# Patient Record
Sex: Female | Born: 1960 | Race: Black or African American | Hispanic: No | Marital: Married | Smoking: Former smoker
Health system: Southern US, Community
[De-identification: ages and names within clinical notes are randomized; demographics above are authoritative.]

## PROBLEM LIST (undated history)

## (undated) DIAGNOSIS — I209 Angina pectoris, unspecified: Secondary | ICD-10-CM

## (undated) DIAGNOSIS — G4730 Sleep apnea, unspecified: Secondary | ICD-10-CM

## (undated) DIAGNOSIS — F329 Major depressive disorder, single episode, unspecified: Secondary | ICD-10-CM

## (undated) DIAGNOSIS — Z9221 Personal history of antineoplastic chemotherapy: Secondary | ICD-10-CM

## (undated) DIAGNOSIS — Z86711 Personal history of pulmonary embolism: Secondary | ICD-10-CM

## (undated) DIAGNOSIS — I10 Essential (primary) hypertension: Secondary | ICD-10-CM

## (undated) DIAGNOSIS — F32A Depression, unspecified: Secondary | ICD-10-CM

## (undated) DIAGNOSIS — R0602 Shortness of breath: Secondary | ICD-10-CM

## (undated) DIAGNOSIS — F3131 Bipolar disorder, current episode depressed, mild: Secondary | ICD-10-CM

## (undated) DIAGNOSIS — Z923 Personal history of irradiation: Secondary | ICD-10-CM

## (undated) DIAGNOSIS — IMO0002 Reserved for concepts with insufficient information to code with codable children: Secondary | ICD-10-CM

## (undated) DIAGNOSIS — F319 Bipolar disorder, unspecified: Secondary | ICD-10-CM

## (undated) DIAGNOSIS — R51 Headache: Secondary | ICD-10-CM

## (undated) DIAGNOSIS — C50919 Malignant neoplasm of unspecified site of unspecified female breast: Secondary | ICD-10-CM

## (undated) HISTORY — DX: Sleep apnea, unspecified: G47.30

## (undated) HISTORY — DX: Major depressive disorder, single episode, unspecified: F32.9

## (undated) HISTORY — PX: TUBAL LIGATION: SHX77

## (undated) HISTORY — DX: Essential (primary) hypertension: I10

## (undated) HISTORY — DX: Malignant neoplasm of unspecified site of unspecified female breast: C50.919

## (undated) HISTORY — DX: Depression, unspecified: F32.A

## (undated) HISTORY — PX: REDUCTION MAMMAPLASTY: SUR839

## (undated) HISTORY — PX: ABDOMINAL HYSTERECTOMY: SHX81

## (undated) HISTORY — PX: BREAST LUMPECTOMY: SHX2

## (undated) HISTORY — DX: Bipolar disorder, current episode depressed, mild: F31.31

---

## 2008-08-31 ENCOUNTER — Emergency Department (HOSPITAL_COMMUNITY): Admission: EM | Admit: 2008-08-31 | Discharge: 2008-09-01 | Payer: Self-pay | Admitting: Emergency Medicine

## 2009-01-13 ENCOUNTER — Ambulatory Visit: Payer: Self-pay | Admitting: *Deleted

## 2009-01-13 ENCOUNTER — Inpatient Hospital Stay (HOSPITAL_COMMUNITY): Admission: RE | Admit: 2009-01-13 | Discharge: 2009-01-17 | Payer: Self-pay | Admitting: *Deleted

## 2010-08-02 ENCOUNTER — Emergency Department (HOSPITAL_COMMUNITY): Admission: EM | Admit: 2010-08-02 | Discharge: 2010-08-03 | Payer: Self-pay | Admitting: Emergency Medicine

## 2010-08-22 ENCOUNTER — Ambulatory Visit: Payer: Self-pay | Admitting: Internal Medicine

## 2010-08-22 DIAGNOSIS — E785 Hyperlipidemia, unspecified: Secondary | ICD-10-CM | POA: Insufficient documentation

## 2010-08-22 DIAGNOSIS — R079 Chest pain, unspecified: Secondary | ICD-10-CM | POA: Insufficient documentation

## 2010-08-22 DIAGNOSIS — R0602 Shortness of breath: Secondary | ICD-10-CM | POA: Insufficient documentation

## 2010-08-24 ENCOUNTER — Telehealth: Payer: Self-pay | Admitting: Internal Medicine

## 2010-08-24 ENCOUNTER — Encounter: Payer: Self-pay | Admitting: Internal Medicine

## 2010-08-24 ENCOUNTER — Ambulatory Visit: Admission: RE | Admit: 2010-08-24 | Discharge: 2010-08-24 | Payer: Self-pay | Admitting: Internal Medicine

## 2010-08-25 ENCOUNTER — Encounter: Payer: Self-pay | Admitting: Internal Medicine

## 2010-08-25 LAB — CONVERTED CEMR LAB
Anti Nuclear Antibody(ANA): NEGATIVE
AntiThromb III Func: 106 % (ref 76–126)
Anticardiolipin IgA: 2 (ref <22)
Anticardiolipin IgG: 4 (ref <23)
Anticardiolipin IgM: 0 (ref <11)
C3 Complement: 161 mg/dL (ref 88–201)
Complement C4, Body Fluid: 40 mg/dL (ref 16–47)
Homocysteine: 7.8 micromoles/L (ref 4.0–15.4)
Protein C Activity: 142 % — ABNORMAL HIGH (ref 75–133)

## 2010-08-28 ENCOUNTER — Telehealth: Payer: Self-pay | Admitting: Internal Medicine

## 2010-09-01 ENCOUNTER — Ambulatory Visit (HOSPITAL_COMMUNITY): Admission: RE | Admit: 2010-09-01 | Discharge: 2010-09-01 | Payer: Self-pay | Admitting: Internal Medicine

## 2010-09-01 ENCOUNTER — Encounter: Payer: Self-pay | Admitting: Internal Medicine

## 2010-09-01 ENCOUNTER — Encounter: Admission: RE | Admit: 2010-09-01 | Discharge: 2010-09-01 | Payer: Self-pay | Admitting: Internal Medicine

## 2010-09-01 ENCOUNTER — Telehealth: Payer: Self-pay | Admitting: Internal Medicine

## 2010-09-01 ENCOUNTER — Telehealth (INDEPENDENT_AMBULATORY_CARE_PROVIDER_SITE_OTHER): Payer: Self-pay | Admitting: *Deleted

## 2010-09-02 ENCOUNTER — Emergency Department (HOSPITAL_COMMUNITY): Admission: EM | Admit: 2010-09-02 | Discharge: 2010-09-03 | Payer: Self-pay | Admitting: Emergency Medicine

## 2010-09-06 ENCOUNTER — Ambulatory Visit: Payer: Self-pay | Admitting: Internal Medicine

## 2010-09-19 ENCOUNTER — Ambulatory Visit: Payer: Self-pay | Admitting: Hematology and Oncology

## 2010-09-21 ENCOUNTER — Ambulatory Visit: Payer: Self-pay | Admitting: Oncology

## 2010-09-25 ENCOUNTER — Encounter: Payer: Self-pay | Admitting: Internal Medicine

## 2010-09-25 ENCOUNTER — Ambulatory Visit (HOSPITAL_COMMUNITY): Admission: RE | Admit: 2010-09-25 | Discharge: 2010-09-25 | Payer: Self-pay | Admitting: Internal Medicine

## 2010-09-30 ENCOUNTER — Ambulatory Visit: Payer: Self-pay | Admitting: Internal Medicine

## 2010-10-16 ENCOUNTER — Ambulatory Visit: Payer: Self-pay | Admitting: Internal Medicine

## 2010-11-03 ENCOUNTER — Ambulatory Visit: Payer: Self-pay | Admitting: Oncology

## 2010-11-20 ENCOUNTER — Ambulatory Visit: Payer: Self-pay | Admitting: Genetic Counselor

## 2010-12-19 ENCOUNTER — Ambulatory Visit
Admission: RE | Admit: 2010-12-19 | Discharge: 2010-12-19 | Payer: Self-pay | Source: Home / Self Care | Attending: Internal Medicine | Admitting: Internal Medicine

## 2010-12-19 ENCOUNTER — Encounter: Payer: Self-pay | Admitting: Internal Medicine

## 2011-01-09 NOTE — Assessment & Plan Note (Signed)
Summary: history of pulm emobolism /plurasy in right lung/cb   Visit Type:  Initial Consult Copy to:  self referral Primary Rome Schlauch/Referring Kaely Hollan:  Dr. Dorothyann Peng  CC:  Pulmonary Consult for sob and chest pain with exertion.Marland Kitchen  History of Present Illness: IOV 08/22/2010: 50 year old overweight female who had PE in 2001. s/p breast cancer 2005. Ex 16 pack smoker - quit july 2011. Has had insidious onset of dyspnea on exertion for several months noticed by family members but not percevied by patient. On 08/02/2010 while shopping felt sudden onset of mid-sternal chest pain.Initially assumed as heart burn but quickly progressed to very severe pain. Went to [facility] ER. CT angiogram ruled out PE and showed normal lungs (personally reviwed). Per hx was told she had pleurisy. Discharged on motrin. 10 days late/10 days ago noticed progresive improvement in pain but still symptomatic. But at this time noticed dyspnea on exertion. Saw Bufford Lope PA at [facility]. CXR reportedly normal. Was given antibiotic Biaxon  and another NSAID Ketorolac. However, despite above measure still with pain and dyspnea. So underwent another CT chest "with contrast"; results pending.   Currently main issue is chest pain and dyspnea. Chest pain is right in middle of sternum between both breasts. Present as a dull, constant pain. Deep inspiraiton or walking makes it worse. Pain is somewhat improved by resting. Denies radiation of pain. Dyspnea is present with  exertion and relieved by  rest and  is moderate in intensity. Dyspnea brought on by actiivies like walking more than 5 minutes at normal pace. Thre is assoicated nausea and some diarrhea nos with onset of chest pain. No associated cough, wheezing, hemoptysius, edema, syncope, fever, joint issues  Note: 20# intentional weight loss past 6 months  Preventive Screening-Counseling & Management  Alcohol-Tobacco     Smoking Status: quit     Smoking Cessation  Counseling: no     Smoke Cessation Stage: quit     Packs/Day: 0.5     Year Started: 1979     Year Quit: 2011- july      Pack years: 16  Current Medications (verified): 1)  Lithium Carbonate 300 Mg Caps (Lithium Carbonate) .... Take 1 Tablet By Mouth Two Times A Day 2)  Prozac 10 Mg Caps (Fluoxetine Hcl) .... Take 1 Tablet By Mouth Once A Day 3)  Biaxin 500 Mg Tabs (Clarithromycin) .... Take 1 Tablet By Mouth Two Times A Day 4)  Ketorolac Tromethamine 10 Mg Tabs (Ketorolac Tromethamine) .... Take 1 Tablet By Mouth Four Times A Day 5)  Lipitor 20 Mg Tabs (Atorvastatin Calcium) .... Take 1 Tablet By Mouth Once A Day  Allergies (verified): 1)  ! Codeine 2)  ! * Lamictal 3)  ! * Neurotin  Past History:  Past Medical History: # Breast cancer in 2005   - Treated at [facility]. Dr Doyne Keel  - Under remission. No oncologist in [place].  # Bilateral pulmonary embolisms in 2001    - "both lungs". Diagnosed at [facility]. Treatd in medical floor with heparin and coumadin  - s/p coumadin Rx for 1 year. Per hx "heart was great", oxygen level and heart rate was 'good' # She has had a history of 12 prior suicide attempts #Bipolar disorder #Hyperlipidemia  Past Surgical History: hysterectomy in 2005,  tubal ligation lumpectomy-2005  Family History: PGM -breast cancer  Social History: the patient is abstinent  from alcohol for the past 6-1/2 years.  Before that she had an alcohol  dependence.  (per echart notes) Gambling addiction Patient states former smoker.  Quit 06-09-10, started 1979 avg 1/2ppd.  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  The patient denies shortness of breath at rest, productive  cough, non-productive cough, coughing up blood, irregular heartbeats, acid heartburn, indigestion, loss of appetite, weight change, abdominal pain, difficulty swallowing, sore throat, tooth/dental problems, headaches, nasal congestion/difficulty breathing through nose, sneezing, itching, ear ache, anxiety, depression, hand/feet swelling, joint stiffness or pain, rash, change in color of mucus, and fever.    Vital Signs:  Patient profile:   50 year old female Height:      65 inches Weight:      237.50 pounds BMI:     39.66 O2 Sat:      97 % on Room air Temp:     98.3 degrees F oral Pulse rate:   68 / minute BP sitting:   120 / 80  (left arm) Cuff size:   regular  Vitals Entered By: Carron Curie CMA (August 22, 2010 4:29 PM)  O2 Flow:  Room air  Serial Vital Signs/Assessments:  Comments: Ambulatory Pulse Oximetry  Resting; HR__85___    02 95  Lap1 (185 feet)   HR__88___   02 Sat_97____ Lap2 (185 feet)   HR___82__   02 Sat__98___    Lap3 (185 feet)   HR___101__   02 Sat__98___  _x_Test Completed without Difficulty ___Test Stopped due to:   By: Carron Curie CMA   CC: Pulmonary Consult for sob and chest pain with exertion. Comments Medications reviewed with patient Carron Curie CMA  August 22, 2010 4:39 PM Daytime phone number verified with patient.    Physical Exam  General:  well developed, well nourished, in no acute distressobese.   Head:  normocephalic and atraumatic Eyes:  PERRLA/EOM intact; conjunctiva and sclera clear Ears:  TMs intact and clear with normal canals Nose:  no deformity, discharge, inflammation, or lesions Mouth:  no deformity or lesions Neck:  no masses, thyromegaly, or abnormal cervical nodes Chest Wall:  no deformities noted Lungs:  clear bilaterally to auscultation and percussion Heart:  regular rate and rhythm, S1, S2 without murmurs, rubs, gallops, or clicks Abdomen:  bowel sounds positive; abdomen soft and non-tender  without masses, or organomegaly Msk:  no deformity or scoliosis noted with normal posture Pulses:  pulses normal Extremities:  no clubbing, cyanosis, edema, or deformity noted Neurologic:  CN II-XII grossly intact with normal reflexes, coordination, muscle strength and tone Skin:  intact without lesions or rashes Cervical Nodes:  no significant adenopathy Axillary Nodes:  no significant adenopathy Psych:  alert and cooperative; normal mood and affect; normal attention span and concentration   CT of Chest  Procedure date:  08/02/2010  Findings:      normal parencyhma 1 small precarinal node no copd normal ct chest  Comments:      outside ct chest 9/92011 is very similar to above.   MISC. Report  Procedure date:  08/02/2010  Findings:      Sodium (NA)  141               135-145          mEq/L  Potassium (K)                            3.4        l      3.5-5.1          mEq/L  Chloride                                 108               96-112           mEq/L  CO2                                      23                19-32            mEq/L  Glucose                                  135        h      70-99            mg/dL  BUN                                      9                 6-23             mg/dL  Creatinine                               0.93              0.4-1.2          mg/dL  GFR, Est Non African American            >60               >60              mL/min  GFR, Est African American                >60               >60              mL/min    Oversized comment, see footnote  1  Bilirubin, Total                         0.5               0.3-1.2          mg/dL  Alkaline Phosphatase                     97                39-117  U/L  SGOT (AST)                               29                0-37             U/L  SGPT (ALT)                               30                0-35             U/L  Total  Protein                           7.9                6.0-8.3          g/dL  Albumin-Blood                            3.9               3.5-5.2          g/dL  Calcium                                  8.9               8.4-10.5         mg/dL  MISC. Report  Procedure date:  08/02/2010  Findings:      trop x 1  - nromal  Impression & Recommendations:  Problem # 1:  DYSPNEA (ICD-786.05) Assessment New Insidoous onset. CHronic progressive wit subacute worsening associated with chest pain. Ex-smoker. Prior PE. Normal CT chest 08/02/2010 and 08/18/2010. Unclear cuase of symptoms  PLAN Get PFTs If normal, consider ECHO or Rt heart cath or CPST or even autimunne workup for lupus pleurisy Finish up biaxin take NSAID prn Orders: Pulmonary Referral (Pulmonary) New Patient Level V ([address])  Problem # 2:  CHEST PAIN (ICD-786.50) Assessment: New as in dyspnea Orders: Pulmonary Referral (Pulmonary) New Patient Level V (60454)  Medications Added to Medication List This Visit: 1)  Lithium Carbonate 300 Mg Caps (Lithium carbonate) .... Take 1 tablet by mouth two times a day 2)  Prozac 10 Mg Caps (Fluoxetine hcl) .... Take 1 tablet by mouth once a day 3)  Biaxin 500 Mg Tabs (Clarithromycin) .... Take 1 tablet by mouth two times a day 4)  Ketorolac Tromethamine 10 Mg Tabs (Ketorolac tromethamine) .... Take 1 tablet by mouth four times a day 5)  Lipitor 20 Mg Tabs (Atorvastatin calcium) .... Take 1 tablet by mouth once a day  Patient Instructions: 1)  2nd ct chest 08/18/2010 looks normal to me 2)  have full breathing test asap at [place] or [place] 3)  once you finish it call me for review 4)  based on results next step   Immunization History:  Influenza Immunization History:    Influenza:  never (08/10/2010)  Pneumovax Immunization History:    Pneumovax:  never (08/10/2010)   Appended Document: Orders Update    Clinical Lists Changes  Orders: Added new Test order of TLB-Rheumatoid Factor (  RA) (04540-JW) - Signed Added  new Test order of TLB-Sedimentation Rate (ESR) (85652-ESR) - Signed      Appended Document: Orders Update    Clinical Lists Changes  Orders: Added new Test order of T- * Misc. Laboratory test [phone]) - Signed

## 2011-01-09 NOTE — Progress Notes (Signed)
Summary: methacholine challenge test- call pt asap  Phone Note Call from Patient Call back at Home Phone [phone]   Caller: Patient Call For: ramaswamy Summary of Call: pt was to have a methacholine challenge test today. she called [facility] and was told they didn't have her scheduled for anything. pt would like a call back asap.  Initial call taken by: Tivis Ringer, CNA,  September 01, 2010 9:55 AM  Follow-up for Phone Call        pt was given # to call cardio pul to reschedule this appt it was a mix up in what both people said  Follow-up by: Oneita Jolly,  September 01, 2010 10:37 AM

## 2011-01-09 NOTE — Letter (Signed)
Summary: CPST- R/O Contraindications  [facility]  [address]   [place], [place] [address]   Phone: [phone]  Fax: [phone]    Patient's Name: SHAMARI TROSTEL Date of Birth: 1961-11-06 MRN: [id number]  *********Rule out Contraindications**************** Absolute                                                                                                                           ___ Acute MI (3-5 Days)                                 ___ Unstable Angina                                          ___ Uncontrolled arrhythmias causing symptoms or hemodynamic compromise. ___ Syncope                                                     ___ Active endocarditis                                         ___ Acute Myocarditis/Pericarditis                        ___ Symptomatic severe aortic stenosis  ___ Acute Pulmonary embolus or pulmonary infarction                ___ Uncontrolled Heart Failure  ___ Thrombosis of lower extremitie ___ Suspected dissecting aneurysm ___ Uncontrolled Asthma                          ___ Pulmonary Edema                                        ___ RA desat @ rest<85%                                      ___ Repiratory Failure                                         ___ Acute noncardiopulmonary disorder that may affect exercise performance or be         aggravated by exercise (ie infection, renal failure,  thyrotoxicosis) .                               ___ Mental impairment leading to inabliity to cooperate   Relative ___ Left main coronary stenosis or its equivalent ___ Moderate stentoic valvular heart disease ___ Severe untreated arterial hypertension @ rest (<200 mmHg             systolic,>164mmHg Diastolic ___ Tachy/Brady Arrhythmias ___ High- degree artioventricular block ___ Hypertrophic cardiomyopathy ___ Significant pulmonary hypertension ___ Advanced or complicated pregnancy ___ Electrolyte abnormalities ___ Orthopedic impairment  that compromises exercise performance  NO CONTRAINDICATIONS  Kalman Shan MD    [facility]

## 2011-01-09 NOTE — Letter (Signed)
Summary: CPST [facility]  [address]   [place], [place] [address]   Phone: [phone]  Fax: [phone]     Patient's Name: NYJAH SCHWAKE Date of Birth: Apr 21, 1961 MRN: [id number]  CPST  Choose test method and choice  a)_x__Bike - recommended by ATS/ACCP. Do at [facility] at Dr. Gala Romney [facility]  b)___Treadmill - less preferred. Do at [facility] at Dr. Gala Romney lab or do at [facility] PFT lab  Choose one or more indication for test  INDICATIONS FOR CARDIOPULMONARY EXERCISE TESTING Evaluation of exercise tolerance __x____ Determination of functional impairment or capacity (peak V? O2) __x____ Determination of exercise-limiting factors and pathophysiologic mechanisms  Evaluation of undiagnosed exercise intolerance ____x_ Assessing contribution of cardiac and pulmonary etiology in coexisting disease __x___ Symptoms disproportionate to resting pulmonary and cardiac tests  __x___Unexplained dyspnea when initial cardiopulmonary testing is nondiagnostic  Evaluation of patients with cardiovascular disease _____ Functional evaluation and prognosis in patients with heart failure _____ Selection for cardiac transplantation _____ Exercise prescription and monitoring response to exercise training for cardiac rehabilitation (special circumstances; i.e., pacemakers)  Evaluation of patients with respiratory disease _____ Functional impairment assessment (see specific clinical applications)  _____Chronic obstructive pulmonary disease Establishing exercise limitation(s) and assessing other potential contributing factors, especially occult heart disease (ischemia) ______Determination of magnitude of hypoxemia and for O2 prescription When objective determination of therapeutic intervention is necessary and not adequately addressed by standard pulmonary function testing  _____ Interstitial lung diseases _____Detection of early (occult) gas exchange abnormalities _____Overall  assessment/monitoring of pulmonary gas exchange _____Determination of magnitude of hypoxemia and for O2 prescription _____Determination of potential exercise-limiting factors _____Documentation of therapeutic response to potentially toxic therapy  ____ Pulmonary vascular disease (careful risk-benefit analysis required)  ____ Cystic fibrosis  ___x_ Exercise-induced bronchospasm  Specific clinical applications ____  Preoperative evaluation _____Lung resectional surgery _____Elderly patients undergoing major abdominal surgery _____Lung volume resectional surgery for emphysema (currently investigational)  ____ Exercise evaluation and prescription for pulmonary rehabilitation  ____ Evaluation for impairment-disability  ____ Evaluation for lung, heart-lung transplantation ____ Definition of abbreviation: V? O2______ -oxygen consumption.    Kalman Shan MD    [facility]

## 2011-01-09 NOTE — Assessment & Plan Note (Signed)
Summary: f/u pft ///kp   Visit Type:  Follow-up Copy to:  self referral Primary Provider/Referring Provider:  Dr. Dorothyann Peng  CC:  Pt here to discuss MCT and pft results..  History of Present Illness: IOV 08/22/2010: 50 year old overweight female who had PE in 2001. s/p breast cancer 2005. Ex 16 pack smoker - quit july 2011. Has had insidious onset of dyspnea on exertion for several months noticed by family members but not percevied by patient. On 08/02/2010 while shopping felt sudden onset of mid-sternal chest pain.Initially assumed as heart burn but quickly progressed to very severe pain. Went to [facility] ER. CT angiogram ruled out PE and showed normal lungs (personally reviwed). Per hx was told she had pleurisy. Discharged on motrin. 10 days late/10 days ago noticed progresive improvement in pain but still symptomatic. But at this time noticed dyspnea on exertion. Saw Bufford Lope PA at [facility]. CXRnormal. Was given antibiotic and NSAID Ketorolac. Dspite above measure still with pain and dyspnea. So underwent another CT chest "with contrast"; results pending.   Currently main issue is chest pain and dyspnea. Chest pain is right in middle of sternum between both breasts. Present as a dull, constant pain. Deep inspiraiton or walking makes it worse. Pain is somewhat improved by resting. Denies radiation of pain. Dyspnea is present with  exertion and relieved by  rest and  is moderate in intensity. Dyspnea brought on by actiivies like walking more than 5 minutes at normal pace. Thre is assoicated nausea and some diarrhea nos with onset of chest pain. No associated cough, wheezing, hemoptysius, edema, syncope, fever, joint issues. Note: 20# intentional weight loss past 6 months. REC: :LAB TEST   OV 09/06/2010: Review test results. In interim, symptoms continue. Autoimmune and hypercoag panel - normal. PFts 08/24/2010 were normal. So, she  underwent methacholine challenge 09/06/2010. She  responded to saline challenge by dropping Fev1 27% to 1.88L. Eval of loop shows possible VCD on insp loop. There are no other complaints. She continues to deny drug abuse. She admits to lot of social stress and also occupation where she has to talk a lot     Preventive Screening-Counseling & Management  Alcohol-Tobacco     Smoking Status: quit     Smoking Cessation Counseling: no     Smoke Cessation Stage: quit     Packs/Day: 0.5     Year Started: 1979     Year Quit: 2011- july      Pack years: 16  Current Medications (verified): 1)  Lithium Carbonate 300 Mg Caps (Lithium Carbonate) .... Take 1 Tablet By Mouth Two Times A Day 2)  Prozac 10 Mg Caps (Fluoxetine Hcl) .... Take 1 Tablet By Mouth Once A Day 3)  Lipitor 20 Mg Tabs (Atorvastatin Calcium) .... Take 1 Tablet By Mouth Once A Day  Allergies (verified): 1)  ! Codeine 2)  ! * Lamictal 3)  ! * Neurotin  Past History:  Past medical, surgical, family and social histories (including risk factors) reviewed, and no changes noted (except as noted below).  Past Medical History: Reviewed history from 08/22/2010 and no changes required. # Breast cancer in 2005   - Treated at [facility]. Dr Doyne Keel  - Under remission. No oncologist in [place].  # Bilateral pulmonary embolisms in 2001    - "both lungs". Diagnosed at [facility]. Treatd in medical floor with heparin and coumadin  - s/p coumadin Rx for 1 year. Per hx "heart was great", oxygen  level and heart rate was 'good' # She has had a history of 12 prior suicide attempts #Bipolar disorder #Hyperlipidemia  Past Surgical History: Reviewed history from 08/22/2010 and no changes required. hysterectomy in 2005,  tubal ligation lumpectomy-2005  Family History: Reviewed history from 08/22/2010 and no changes required. PGM -breast cancer  Social History: Reviewed history from 08/22/2010 and no changes required. the patient is abstinent  from alcohol for the past 6-1/2  years.  Before that she had an alcohol  dependence.  (per echart notes) Gambling addiction Patient states former smoker.  Quit 06-09-10, started 1979 avg 1/2ppd.  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 Comments Medications reviewed with patient Carron Curie CMA  September 06, 2010 9:45 AM Daytime phone number verified with patient.    Physical Exam  General:  well developed, well nourished, in no acute distressobese.   Head:  normocephalic and atraumatic Eyes:  PERRLA/EOM intact; conjunctiva and sclera clear Ears:  TMs intact and clear with normal canals Nose:  no deformity, discharge, inflammation, or lesions Mouth:  no deformity or lesions Neck:  no masses, thyromegaly, or abnormal cervical nodes Chest Wall:  no deformities noted Lungs:  clear bilaterally to auscultation and percussion Heart:  regular rate and rhythm, S1, S2 without murmurs, rubs, gallops, or clicks Abdomen:  bowel sounds positive; abdomen soft and non-tender without masses, or organomegaly Msk:  no deformity or scoliosis noted with normal posture Pulses:  pulses normal Extremities:  no clubbing, cyanosis, edema, or deformity noted Neurologic:  CN II-XII grossly intact with normal reflexes, coordination, muscle strength and tone Skin:  intact without lesions or rashes Cervical Nodes:  no significant adenopathy Axillary Nodes:  no significant  adenopathy Psych:  alert and cooperative; normal mood and affect; normal attention span and concentration   Impression & Recommendations:  Problem # 1:  DYSPNEA (ICD-786.05) Assessment Unchanged  Orders: Est. Patient Level II (66440)  Insidoous onset. CHronic progressive wit subacute worsening associated with chest pain. Ex-smoker. Prior PE. Normal CT chest 08/02/2010 and 08/18/2010. Normal PFT sept 2011. Normal autimimmune and hypercoag profile.  Methacholine challenge test unsuually hyperresponsive to saline with possible evidence of VCD in saline challenge insp loop. Still,. Unclear cuase of symptoms  PLAN  CPST test  Problem # 2:  CHEST PAIN (ICD-786.50)  Orders: Est. Patient Level II (34742)  Patient Instructions: 1)  please have CPST bike test 2)  followup after bike test 3)  next visit we will give you flu shot    Appended Document: f/u pft ///kp pls place order for CPSt. I just forgot to do that but forms filled  Appended Document: Orders Update    Clinical Lists Changes  Orders: Added  new Referral order of Cardio-Pulmonary Stress Test Referral (Cardio-Pulmon) - Signed

## 2011-01-09 NOTE — Progress Notes (Signed)
Summary: SOB/ recs?  Phone Note Call from Patient   Caller: Patient Call For: [facility] Summary of Call: pt had methacholine challenge test today. says she failed. she c/o SOB/ difficulty breathing even while washing dishes. wants to know what recs are since she doesn't see MR until next wed. call cell (since she is "out and about today") [phone] Initial call taken by: Tivis Ringer, CNA,  September 01, 2010 3:56 PM  Follow-up for Phone Call        tightness in chest, increased SOB with activity x1-2weeks - denies wheezing, cough, f/c/s.  please advise, thanks!  MR not in office today.  will send to doc of the day. Boone Master CNA/MA  September 01, 2010 4:32 PM      Additional Follow-up for Phone Call Additional follow up Details #1::        d/w Dr. Marchelle Gearing.  He will call to discuss with patient.  Will forward phone note to Dr. Marchelle Gearing. Additional Follow-up by: Coralyn Helling MD,  September 01, 2010 5:01 PM    Additional Follow-up for Phone Call Additional follow up Details #2::    spoke to patient: Symptoms are chronic and no worse after methacholine challlenge. TEst reportedly terminated at saline because of abnromal response. Advised empiric rantidine and omeprazole with tums. IF worse anytime, go to ER. Will see her next week. Need the methahole results at time of fu Follow-up by: Kalman Shan MD,  September 01, 2010 5:18 PM

## 2011-01-09 NOTE — Progress Notes (Signed)
Summary: needs methacholine chalenge-order placed  Phone Note Outgoing Call   Summary of Call: hypercoagulable profile and autioimmune panel are negative. STill with symptoms. Next step do methacholine challenge test. I have ordered this She will call once test is done to review reslts. If negaive, will do cpst Initial call taken by: Kalman Shan MD,  August 28, 2010 1:37 PM

## 2011-01-09 NOTE — Progress Notes (Signed)
Summary: PFT normal-labs ordered  Phone Note Call from Patient   Caller: Patient Call For: ramaswamy Summary of Call: pt had pft's done this am at [facility]. wants results. she also has scheduled a f/u w/ mr for 9/28 but wants results asap. cell [phone] Initial call taken by: Tivis Ringer, CNA,  August 24, 2010 12:45 PM  Follow-up for Phone Call        PFTs requested from [facility].  Will place in MR's to do bucket once received.  Crystal Jones RN  August 24, 2010 1:53 PM Pt returned call. i informed her of the "above" and pt said that's fine. she will await call re: results from MR or nurse after he recieves and has reviewed results. Tivis Ringer, CNA  August 24, 2010 2:21 PM  PFT results received and placed in MRs to do bucket.  Will forward message to him so he is aware pt requesting results asap.     Follow-up by: Gweneth Dimitri RN,  August 24, 2010 2:49 PM  Additional Follow-up for Phone Call Additional follow up Details #1::        PFts9/15/2011 are NORMAL. Gave results. Will order autoimmune workup for lupus and hypercoag panel. She will come tomorrow morning Additional Follow-up by: Kalman Shan MD,  August 24, 2010 5:33 PM    Additional Follow-up for Phone Call Additional follow up Details #2::    placed lab order in IDx and pt came in at 10:55 to have labs drawn. advised will call her with results. Carron Curie CMA  August 25, 2010 10:58 AM

## 2011-01-09 NOTE — Assessment & Plan Note (Signed)
Summary: F/U SOB/CPST ON 09/25/10/[facility]   Visit Type:  Follow-up Copy to:  self referral Primary Provider/Referring Provider:  Dr. Dorothyann Peng  CC:  Follow-up to discuss CPST. Marland Kitchen  History of Present Illness: IOV 08/22/2010: 50 year old overweight female who had PE in 2001. s/p breast cancer 2005. Ex 16 pack smoker - quit july 2011. Has had insidious onset of dyspnea on exertion for several months noticed by family members but not percevied by patient. On 08/02/2010 while shopping felt sudden onset of mid-sternal chest pain.Initially assumed as heart burn but quickly progressed to very severe pain. Went to [facility] ER. CT angiogram ruled out PE and showed normal lungs (personally reviwed). Per hx was told she had pleurisy. Discharged on motrin. 10 days late/10 days ago noticed progresive improvement in pain but still symptomatic. But at this time noticed dyspnea on exertion. Saw Bufford Lope PA at [facility]. CXRnormal. Was given antibiotic and NSAID Ketorolac. Dspite above measure still with pain and dyspnea. So underwent another CT chest "with contrast"; results pending.   Currently main issue is chest pain and dyspnea. Chest pain is right in middle of sternum between both breasts. Present as a dull, constant pain. Deep inspiraiton or walking makes it worse. Pain is somewhat improved by resting. Denies radiation of pain. Dyspnea is present with  exertion and relieved by  rest and  is moderate in intensity. Dyspnea brought on by actiivies like walking more than 5 minutes at normal pace. Thre is assoicated nausea and some diarrhea nos with onset of chest pain. No associated cough, wheezing, hemoptysius, edema, syncope, fever, joint issues. Note: 20# intentional weight loss past 6 months. REC: :LAB TEST   OV 09/06/2010: Review test results. In interim, symptoms continue. Autoimmune and hypercoag panel - normal. PFts 08/24/2010 were normal. So, she  underwent methacholine challenge 09/06/2010. She  responded to saline challenge by dropping Fev1 27% to 1.88L. Eval of loop shows possible VCD on insp loop. There are no other complaints. She continues to deny drug abuse. She admits to lot of social stress and also occupation where she has to talk a lot. REC: CPS      October 16, 2010: FU after CPST that was done on 09/25/2010. CPST shows obesity and EIB. From resp standpoiont no new symptoms since last visit. Having significant cycling of bipolar per history. Seeing a pscyh. No active suicidal ideation. Has not had flu shot. C/p of left biceps area tenderness without swelling or fever.   Preventive Screening-Counseling & Management  Alcohol-Tobacco     Smoking Status: quit     Smoking Cessation Counseling: no     Smoke Cessation Stage: quit     Packs/Day: 0.5     Year Started: 1979     Year Quit: 2011- july      Pack years: 16  Current Medications (verified): 1)  Lithium Carbonate 300 Mg Caps (Lithium Carbonate) .... Take2 Tablet By Mouth Two Times A Day 2)  Prozac 10 Mg Caps (Fluoxetine Hcl) .... Take 1 Tablet By Mouth Once A Day 3)  Lipitor 20 Mg Tabs (Atorvastatin Calcium) .... Take 1 Tablet By Mouth Once A Day 4)  Risperdal 1 Mg Tabs (Risperidone) .... Take 1 Tablet By Mouth Once A Day  Allergies (verified): 1)  ! Codeine 2)  ! * Lamictal 3)  ! * Neurotin  Past History:  Past medical, surgical, family and social histories (including risk factors) reviewed, and no changes noted (except as noted below).  Past Medical History: Reviewed history from 08/22/2010 and no changes required. # Breast cancer in 2005   - Treated at [facility]. Dr Doyne Keel  - Under remission. No oncologist in [place].  # Bilateral pulmonary embolisms in 2001    - "both lungs". Diagnosed at [facility]. Treatd in medical floor with heparin and coumadin  - s/p coumadin Rx for 1 year. Per hx "heart was great", oxygen level and heart rate was 'good' # She has had a history of 12 prior suicide  attempts #Bipolar disorder #Hyperlipidemia  Past Surgical History: Reviewed history from 08/22/2010 and no changes required. hysterectomy in 2005,  tubal ligation lumpectomy-2005  Family History: Reviewed history from 08/22/2010 and no changes required. PGM -breast cancer  Social History: Reviewed history from 08/22/2010 and no changes required. the patient is abstinent  from alcohol for the past 6-1/2 years.  Before that she had an alcohol  dependence.  (per echart notes) Gambling addiction Patient states former smoker.  Quit 06-09-10, started 1979 avg 1/2ppd.  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  The patient denies shortness of breath at rest, productive cough, non-productive cough, coughing up blood, chest pain, irregular heartbeats, acid heartburn, indigestion, loss of appetite, weight change, abdominal pain, difficulty swallowing, sore throat, tooth/dental problems, headaches, nasal congestion/difficulty breathing through nose, sneezing, itching, ear ache, anxiety, depression, hand/feet swelling, rash, change in color of mucus, and fever.         pain in left arm  Vital Signs:  Patient profile:   50 year old female Height:      65 inches Weight:      241.50 pounds BMI:     40.33 O2 Sat:      98 % on Room air Temp:     98.3 degrees F oral Pulse rate:   73 / minute BP sitting:   132 / 78  (right arm) Cuff size:   large  Vitals Entered By: Carron Curie CMA (October 16, 2010 4:26 PM)  O2 Flow:  Room air CC: Follow-up to discuss CPST.  Comments Medications reviewed with patient Carron Curie CMA  October 16, 2010 4:28 PM Daytime phone number verified with patient.    Physical Exam  General:  well developed, well  nourished, in no acute distressobese.   Head:  normocephalic and atraumatic Eyes:  PERRLA/EOM intact; conjunctiva and sclera clear Ears:  TMs intact and clear with normal canals Nose:  no deformity, discharge, inflammation, or lesions Mouth:  no deformity or lesions Neck:  no masses, thyromegaly, or abnormal cervical nodes Chest Wall:  no deformities noted Lungs:  clear bilaterally to auscultation and percussion Heart:  regular rate and rhythm, S1, S2 without murmurs, rubs, gallops, or clicks Abdomen:  bowel sounds positive; abdomen soft and non-tender without masses, or organomegaly Msk:  no deformity or scoliosis noted with normal posture Pulses:  pulses normal Extremities:  left arm mid 1/3  - indurated area of 3cm but no redness or crepitus but moderately tender Neurologic:  CN II-XII grossly intact with normal reflexes, coordination, muscle strength and tone Skin:  intact without lesions or rashes Cervical Nodes:  no significant adenopathy Axillary Nodes:  no significant adenopathy Psych:  alert and  cooperative; normal mood and affect; normal attention span and concentration   Impression & Recommendations:  Problem # 1:  DYSPNEA (ICD-786.05) Assessment Unchanged  Orders: Est. Patient Level II (24401)  Insidoous onset. CHronic progressive wit subacute worsening associated with chest pain. Ex-smoker. Prior PE. Normal CT chest 08/02/2010 and 08/18/2010. Normal PFT sept 2011. Normal autimimmune and hypercoag profile.  Methacholine challenge test unsuually hyperresponsive to saline with possible evidence of VCD in saline challenge insp loop. CPST 09/26/2010 shows obesity and positive EIB though this could have been VCD  PLAN  - weight loss discussed - she is finding this challengeing esp in seting of bipolar. WE discussed joining weight watchers. AT some point in future she wants referral to [facility] for bariatric surgery evaluation  - start empiric qvar for EIB  =-rov 2 months - flu shot  today  - advised to go to PMD about tender area in left arm   Orders: Est. Patient Level II (02725)  Medications Added to Medication List This Visit: 1)  Lithium Carbonate 300 Mg Caps (Lithium carbonate) .... Take2 tablet by mouth two times a day 2)  Risperdal 1 Mg Tabs (Risperidone) .... Take 1 tablet by mouth once a day 3)  Qvar 40 Mcg/act Aers (Beclomethasone dipropionate) .... Two puffs twice daily 4)  Proair Hfa 108 (90 Base) Mcg/act Aers (Albuterol sulfate) .Marland Kitchen.. 1-2 puffs every 4-6 hours as needed  Patient Instructions: 1)  pleae have flu shot 2)  start QVAR 2 puff two times a day and albuterol as needed 3)   - take 1 sample of each 4)   - learn technique 5)  return in 2 months 6)  spirometry at followup 7)  focus on weight loss 8)  see your primary care doctor for your painful left arm Prescriptions: PROAIR HFA 108 (90 BASE) MCG/ACT  AERS (ALBUTEROL SULFATE) 1-2 puffs every 4-6 hours as needed  #1 x 6   Entered and Authorized by:   Kalman Shan MD   Signed by:   Kalman Shan MD on 10/16/2010   Method used:   Electronically to        [facility] [phone]* (retail)       [address]       [place], [place]  [address]       Ph: [phone]       Fax: [phone]   RxID:   [id number] QVAR 40 MCG/ACT  AERS (BECLOMETHASONE DIPROPIONATE) Two puffs twice daily  #1 x 2   Entered and Authorized by:   Kalman Shan MD   Signed by:   Kalman Shan MD on 10/16/2010   Method used:   Electronically to        [facility] [phone]* (retail)       [address]       [place], [place]  [address]       Ph: [phone]       Fax: [phone]   RxID:   [id number]

## 2011-01-09 NOTE — Progress Notes (Signed)
Summary: Pt's Medical Hx/Patient  Pt's Medical Hx/Patient   Imported By: Sherian Rein 09/06/2010 11:31:26  _____________________________________________________________________  External Attachment:    Type:   Image     Comment:   External Document

## 2011-01-11 NOTE — Assessment & Plan Note (Signed)
Summary: rov 2 months///kp   Visit Type:  Follow-up Copy to:  self referral Primary Provider/Referring Provider:  Dr. Dorothyann Peng  CC:  follow-up. pt states breathing doing well on qvar.Marland Kitchen  History of Present Illness: Followup dyspnea due to obesity, asthma (proven on mc challenge and EIB on CPST fall 2011) +/- VCD  December 19, 2010: Last visit was 09/06/2010. Started on QVAR at that time for new dx of asthma. Now reporting for fu. States qvar helping a lot. Less dyspneic. Rescue albueterol use is only 1 per 2 weeks. Less chest tightness. Improved dyspnea. Denies associated cough, wheeze. Still with some dysnea when she climbs stairs. No new complaints. Reports qvar compliance. STrluggling to lose weight   Preventive Screening-Counseling & Management  Alcohol-Tobacco     Smoking Status: quit     Smoking Cessation Counseling: no     Smoke Cessation Stage: quit     Packs/Day: 0.5     Year Started: 1979     Year Quit: 2011- july      Pack years: 16  Current Medications (verified): 1)  Lithium Carbonate 300 Mg Caps (Lithium Carbonate) .... Take2 Tablet By Mouth Two Times A Day 2)  Prozac 10 Mg Caps (Fluoxetine Hcl) .... Take 1 Tablet By Mouth Once Every Other Day 3)  Lipitor 20 Mg Tabs (Atorvastatin Calcium) .... Take 1 Tablet By Mouth Once A Day 4)  Risperdal 1 Mg Tabs (Risperidone) .... Take 1 Tablet By Mouth Once A Day 5)  Qvar 40 Mcg/act Aers (Beclomethasone Dipropionate) .... 2 Puffs Twice Daily 6)  Proair Hfa 108 (90 Base) Mcg/act Aers (Albuterol Sulfate) .... 2 Puffs Every 6 Hours As Needed  Allergies: 1)  ! Codeine 2)  ! * Lamictal 3)  ! * Neurotin 4)  ! * Topamax  Past History:  Past medical, surgical, family and social histories (including risk factors) reviewed, and no changes noted (except as noted below).  Past Medical History: Reviewed history from 08/22/2010 and no changes required. # Breast cancer in 2005   - Treated at [facility]. Dr Doyne Keel  - Under  remission. No oncologist in [place].  # Bilateral pulmonary embolisms in 2001    - "both lungs". Diagnosed at [facility]. Treatd in medical floor with heparin and coumadin  - s/p coumadin Rx for 1 year. Per hx "heart was great", oxygen level and heart rate was 'good' # She has had a history of 12 prior suicide attempts #Bipolar disorder #Hyperlipidemia  Past Surgical History: Reviewed history from 08/22/2010 and no changes required. hysterectomy in 2005,  tubal ligation lumpectomy-2005  Family History: Reviewed history from 08/22/2010 and no changes required. PGM -breast cancer  Social History: Reviewed history from 08/22/2010 and no changes required. the patient is abstinent  from alcohol for the past 6-1/2 years.  Before that she had an alcohol  dependence.  (per echart notes) Gambling addiction Patient states former smoker.  Quit 06-09-10, started 1979 avg 1/2ppd.  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  Moved from [place], [place] to [place], [place] Dad lives in [place]  Review of Systems  The patient denies shortness of breath with activity, shortness of breath at rest, productive cough, non-productive cough, coughing up blood, chest pain, irregular heartbeats, acid heartburn, indigestion, loss of appetite, weight change, abdominal pain, difficulty swallowing, sore throat, tooth/dental problems, headaches, nasal congestion/difficulty breathing through nose, sneezing, itching, ear ache, anxiety, depression, hand/feet swelling, joint stiffness  or pain, rash, change in color of mucus, and fever.    Vital Signs:  Patient profile:   50 year old female Height:      65 inches Weight:      244.50 pounds BMI:     40.83 O2 Sat:      99 % on Room air Temp:     98.1 degrees F oral Pulse rate:   73 / minute BP sitting:   112 / 72  (right arm) Cuff size:   regular  Vitals Entered By: Carron Curie CMA (December 19, 2010 2:41 PM)  O2 Flow:  Room air CC: follow-up. pt states breathing doing well on qvar. Comments Medications reviewed with patient Carron Curie CMA  December 19, 2010 2:45 PM Daytime phone number verified with patient.    Physical Exam  General:  well developed, well nourished, in no acute distressobese.   Head:  normocephalic and atraumatic Eyes:  PERRLA/EOM intact; conjunctiva and sclera clear Ears:  TMs intact and clear with normal canals Nose:  no deformity, discharge, inflammation, or lesions Mouth:  no deformity or lesions Neck:  no masses, thyromegaly, or abnormal cervical nodes Chest Wall:  no deformities noted Lungs:  clear bilaterally to auscultation and percussion Heart:  regular rate and rhythm, S1, S2 without murmurs, rubs, gallops, or clicks Abdomen:  bowel sounds positive; abdomen soft and non-tender without masses, or organomegaly Msk:  no deformity or scoliosis noted with normal posture Pulses:  pulses normal Extremities:  left arm mid 1/3  - indurated area of 3cm but no redness or crepitus but moderately tender Neurologic:  CN II-XII grossly intact with normal reflexes, coordination, muscle strength and tone Skin:  intact without lesions or rashes Cervical Nodes:  no significant adenopathy Axillary Nodes:  no significant adenopathy Psych:  alert and cooperative; normal mood and affect; normal attention span and concentration   MISC. Report  Procedure date:  12/19/2010  Findings:      spirometry toay - normal. similar to before  Impression & Recommendations:  Problem # 1:  DYSPNEA (ICD-786.05) Assessment Improved  Improved with asthma Rx QVAR subjectively. Objectively no change but this might never happen  because of asthma dx based onmethacholine challenge and EIB. Obesity playing a significant role too   PLAN  -discussed weight loss. Encouraged to joine a Cytogeneticist or zone diet - contnue qvar (sample given and MDI tech  taught) - rov 6 months  Orders: Est. Patient Level III ([address]) HFA Instruction [phone])  Medications Added to Medication List This Visit: 1)  Prozac 10 Mg Caps (Fluoxetine hcl) .... Take 1 tablet by mouth once every other day 2)  Qvar 40 Mcg/act Aers (Beclomethasone dipropionate) .... 2 puffs twice daily 3)  Proair Hfa 108 (90 Base) Mcg/act Aers (Albuterol sulfate) .... 2 puffs every 6 hours as needed  Patient Instructions: 1)  continue qvar 2)  take samples 3)  show technique on inhalres 4)  join weight watchers or go to [email or web address] 5)  return in 6 months Prescriptions: PROAIR HFA 108 (90 BASE) MCG/ACT AERS (ALBUTEROL SULFATE) 2 puffs every 6 hours as needed  #1 x 2   Entered and Authorized by:   Kalman Shan MD   Signed by:   Kalman Shan MD on 12/19/2010   Method used:   Electronically to        [facility] [address] [phone]* (retail)       [address]  [place], [place]  [address]       Ph: [phone] or [phone]       Fax: [phone]   RxID:   [id number] QVAR 40 MCG/ACT AERS (BECLOMETHASONE DIPROPIONATE) 2 puffs twice daily  #1 x 6   Entered and Authorized by:   Kalman Shan MD   Signed by:   Kalman Shan MD on 12/19/2010   Method used:   Electronically to        [facility] [address] [phone]* (retail)       [address]       [place], [place]  [address]       Ph: [phone] or [phone]       Fax: [phone]   RxID:   [id number]

## 2011-02-21 ENCOUNTER — Emergency Department (HOSPITAL_COMMUNITY)
Admission: EM | Admit: 2011-02-21 | Discharge: 2011-02-22 | Disposition: A | Discharge status: Home / Self Care | Payer: BC Managed Care – PPO | Attending: Emergency Medicine | Admitting: Emergency Medicine

## 2011-02-21 DIAGNOSIS — Z79899 Other long term (current) drug therapy: Secondary | ICD-10-CM | POA: Insufficient documentation

## 2011-02-21 DIAGNOSIS — R000 Tachycardia, unspecified: Secondary | ICD-10-CM | POA: Insufficient documentation

## 2011-02-21 DIAGNOSIS — Z86718 Personal history of other venous thrombosis and embolism: Secondary | ICD-10-CM | POA: Insufficient documentation

## 2011-02-21 DIAGNOSIS — R112 Nausea with vomiting, unspecified: Secondary | ICD-10-CM | POA: Insufficient documentation

## 2011-02-21 DIAGNOSIS — R630 Anorexia: Secondary | ICD-10-CM | POA: Insufficient documentation

## 2011-02-21 DIAGNOSIS — Z853 Personal history of malignant neoplasm of breast: Secondary | ICD-10-CM | POA: Insufficient documentation

## 2011-02-21 DIAGNOSIS — R1915 Other abnormal bowel sounds: Secondary | ICD-10-CM | POA: Insufficient documentation

## 2011-02-21 DIAGNOSIS — B9789 Other viral agents as the cause of diseases classified elsewhere: Secondary | ICD-10-CM | POA: Insufficient documentation

## 2011-02-21 DIAGNOSIS — F319 Bipolar disorder, unspecified: Secondary | ICD-10-CM | POA: Insufficient documentation

## 2011-02-21 DIAGNOSIS — R197 Diarrhea, unspecified: Secondary | ICD-10-CM | POA: Insufficient documentation

## 2011-02-21 DIAGNOSIS — E669 Obesity, unspecified: Secondary | ICD-10-CM | POA: Insufficient documentation

## 2011-02-21 DIAGNOSIS — R509 Fever, unspecified: Secondary | ICD-10-CM | POA: Insufficient documentation

## 2011-02-21 LAB — POCT I-STAT, CHEM 8
Calcium, Ion: 1.17 mmol/L (ref 1.12–1.32)
Chloride: 111 mEq/L (ref 96–112)
Glucose, Bld: 121 mg/dL — ABNORMAL HIGH (ref 70–99)
Hemoglobin: 15 g/dL (ref 12.0–15.0)
Potassium: 4.1 mEq/L (ref 3.5–5.1)
Sodium: 143 mEq/L (ref 135–145)

## 2011-02-21 LAB — CBC
Hemoglobin: 13.6 g/dL (ref 12.0–15.0)
MCH: 28.4 pg (ref 26.0–34.0)
MCHC: 32.9 g/dL (ref 30.0–36.0)
Platelets: 195 10*3/uL (ref 150–400)

## 2011-02-21 LAB — DIFFERENTIAL
Lymphs Abs: 0.7 10*3/uL (ref 0.7–4.0)
Monocytes Relative: 6 % (ref 3–12)

## 2011-02-22 LAB — POCT I-STAT, CHEM 8
Chloride: 107 mEq/L (ref 96–112)
HCT: 40 % (ref 36.0–46.0)
Hemoglobin: 13.6 g/dL (ref 12.0–15.0)
Potassium: 4 mEq/L (ref 3.5–5.1)
Sodium: 140 mEq/L (ref 135–145)
TCO2: 24 mmol/L (ref 0–100)

## 2011-02-22 LAB — COMPREHENSIVE METABOLIC PANEL
Alkaline Phosphatase: 88 U/L (ref 39–117)
BUN: 11 mg/dL (ref 6–23)
CO2: 25 mEq/L (ref 19–32)
Calcium: 9.4 mg/dL (ref 8.4–10.5)
Chloride: 111 mEq/L (ref 96–112)
Creatinine, Ser: 0.95 mg/dL (ref 0.4–1.2)
GFR calc Af Amer: >60 mL/min (ref >60)
Sodium: 142 mEq/L (ref 135–145)
Total Bilirubin: 0.5 mg/dL (ref 0.3–1.2)

## 2011-02-22 LAB — POCT CARDIAC MARKERS: CKMB, poc: <1 ng/mL — ABNORMAL LOW (ref 1.0–8.0)

## 2011-02-23 LAB — CK TOTAL AND CKMB (NOT AT ARMC)
CK, MB: 1.3 ng/mL (ref 0.3–4.0)
Relative Index: 0.9 (ref 0.0–2.5)
Total CK: 138 U/L (ref 7–177)

## 2011-02-23 LAB — CBC
MCH: 29.6 pg (ref 26.0–34.0)
MCV: 86.6 fL (ref 78.0–100.0)
Platelets: 220 10*3/uL (ref 150–400)
RBC: 4.29 MIL/uL (ref 3.87–5.11)

## 2011-02-23 LAB — DIFFERENTIAL
Eosinophils Absolute: 0.1 10*3/uL (ref 0.0–0.7)
Lymphs Abs: 2 10*3/uL (ref 0.7–4.0)
Monocytes Relative: 8 % (ref 3–12)
Neutro Abs: 4.2 10*3/uL (ref 1.7–7.7)

## 2011-02-23 LAB — COMPREHENSIVE METABOLIC PANEL
ALT: 30 U/L (ref 0–35)
AST: 29 U/L (ref 0–37)
Albumin: 3.9 g/dL (ref 3.5–5.2)
BUN: 9 mg/dL (ref 6–23)
Calcium: 8.9 mg/dL (ref 8.4–10.5)
Chloride: 108 mEq/L (ref 96–112)
GFR calc Af Amer: >60 mL/min (ref >60)
GFR calc non Af Amer: >60 mL/min (ref >60)
Glucose, Bld: 135 mg/dL — ABNORMAL HIGH (ref 70–99)
Potassium: 3.4 mEq/L — ABNORMAL LOW (ref 3.5–5.1)
Total Protein: 7.9 g/dL (ref 6.0–8.3)

## 2011-03-27 LAB — URINALYSIS, ROUTINE W REFLEX MICROSCOPIC
Hgb urine dipstick: NEGATIVE
Protein, ur: NEGATIVE mg/dL
Urobilinogen, UA: 0.2 mg/dL (ref 0.0–1.0)

## 2011-03-27 LAB — COMPREHENSIVE METABOLIC PANEL
Alkaline Phosphatase: 81 U/L (ref 39–117)
Chloride: 106 mEq/L (ref 96–112)
Creatinine, Ser: 0.97 mg/dL (ref 0.4–1.2)
Glucose, Bld: 111 mg/dL — ABNORMAL HIGH (ref 70–99)
Potassium: 3.5 mEq/L (ref 3.5–5.1)
Sodium: 139 mEq/L (ref 135–145)
Total Protein: 6.7 g/dL (ref 6.0–8.3)

## 2011-03-27 LAB — DRUGS OF ABUSE SCREEN W/O ALC, ROUTINE URINE
Barbiturate Quant, Ur: NEGATIVE
Creatinine,U: 116.5 mg/dL
Methadone: NEGATIVE
Phencyclidine (PCP): NEGATIVE
Propoxyphene: NEGATIVE

## 2011-03-27 LAB — CBC
HCT: 37.3 % (ref 36.0–46.0)
Hemoglobin: 12.6 g/dL (ref 12.0–15.0)
MCHC: 33.9 g/dL (ref 30.0–36.0)
Platelets: 180 10*3/uL (ref 150–400)
RBC: 4.28 MIL/uL (ref 3.87–5.11)
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
## 2011-06-11 ENCOUNTER — Other Ambulatory Visit: Payer: Self-pay | Admitting: Internal Medicine

## 2011-06-11 DIAGNOSIS — N6459 Other signs and symptoms in breast: Secondary | ICD-10-CM

## 2011-07-17 ENCOUNTER — Ambulatory Visit
Admission: RE | Admit: 2011-07-17 | Discharge: 2011-07-17 | Disposition: A | Discharge status: Home / Self Care | Payer: BC Managed Care – PPO | Source: Ambulatory Visit | Attending: Internal Medicine | Admitting: Internal Medicine

## 2011-07-17 DIAGNOSIS — N6459 Other signs and symptoms in breast: Secondary | ICD-10-CM

## 2011-09-10 LAB — DIFFERENTIAL
Basophils Relative: 0
Eosinophils Absolute: 0
Eosinophils Relative: 0
Lymphs Abs: 1.2
Monocytes Relative: 3

## 2011-09-10 LAB — POCT I-STAT, CHEM 8
Calcium, Ion: 1.14
Creatinine, Ser: 0.9
Glucose, Bld: 92
Hemoglobin: 13.3
TCO2: 27

## 2011-09-10 LAB — CBC
HCT: 37.4
MCHC: 34.4
MCV: 85.6
RBC: 4.37
WBC: 5.6

## 2011-11-07 ENCOUNTER — Ambulatory Visit (HOSPITAL_BASED_OUTPATIENT_CLINIC_OR_DEPARTMENT_OTHER): Payer: BC Managed Care – PPO | Admitting: Oncology

## 2011-11-07 ENCOUNTER — Other Ambulatory Visit (HOSPITAL_BASED_OUTPATIENT_CLINIC_OR_DEPARTMENT_OTHER): Payer: BC Managed Care – PPO | Admitting: Lab

## 2011-11-07 ENCOUNTER — Other Ambulatory Visit: Payer: Self-pay | Admitting: Oncology

## 2011-11-07 ENCOUNTER — Telehealth: Payer: Self-pay | Admitting: Oncology

## 2011-11-07 ENCOUNTER — Encounter: Payer: Self-pay | Admitting: Oncology

## 2011-11-07 VITALS — BP 138/91 | HR 98 | Temp 98.0°F | Ht 65.0 in | Wt 268.1 lb

## 2011-11-07 DIAGNOSIS — C50919 Malignant neoplasm of unspecified site of unspecified female breast: Secondary | ICD-10-CM

## 2011-11-07 DIAGNOSIS — R635 Abnormal weight gain: Secondary | ICD-10-CM

## 2011-11-07 DIAGNOSIS — J45909 Unspecified asthma, uncomplicated: Secondary | ICD-10-CM

## 2011-11-07 DIAGNOSIS — Z853 Personal history of malignant neoplasm of breast: Secondary | ICD-10-CM

## 2011-11-07 DIAGNOSIS — Z170 Estrogen receptor positive status [ER+]: Secondary | ICD-10-CM

## 2011-11-07 LAB — CBC WITH DIFFERENTIAL/PLATELET
Basophils Absolute: 0 10*3/uL (ref 0.0–0.1)
EOS%: 2.3 % (ref 0.0–7.0)
HCT: 39.1 % (ref 34.8–46.6)
HGB: 13 g/dL (ref 11.6–15.9)
LYMPH%: 36.6 % (ref 14.0–49.7)
MCH: 28 pg (ref 25.1–34.0)
MCV: 84.3 fL (ref 79.5–101.0)
NEUT%: 51.6 % (ref 38.4–76.8)
Platelets: 203 10*3/uL (ref 145–400)
lymph#: 1.9 10*3/uL (ref 0.9–3.3)

## 2011-11-07 LAB — COMPREHENSIVE METABOLIC PANEL
AST: 24 U/L (ref 0–37)
BUN: 10 mg/dL (ref 6–23)
Calcium: 9 mg/dL (ref 8.4–10.5)
Chloride: 107 mEq/L (ref 96–112)
Creatinine, Ser: 0.86 mg/dL (ref 0.50–1.10)
Total Bilirubin: 0.5 mg/dL (ref 0.3–1.2)

## 2011-11-07 NOTE — Progress Notes (Signed)
OFFICE PROGRESS NOTE  CC  Dr. Dorothyann Peng   DIAGNOSIS: 50 year old female with invasive ductal carcinoma originally diagnosed in 2004 in [place]. Patient was originally seen by me on 11/06/2010 her  PRIOR THERAPY:  #1 or 2004 patient felt a mass. She then went on to have a lumpectomy of this mass. The final pathology revealed an invasive ductal carcinoma that was ER positive PR positive. Patient recollected that she did receive neoadjuvant chemotherapy consisting of Adriamycin and possibly he had an excellent response. And then in July 2005 she had her lumpectomy. Followed by radiation therapy. She was then begun on Arimidex completing in 2010.  #2 she also underwent bilateral oophorectomies.  #3 she relocated to [facility] and has been seeing Dr. Leontine Locket as her for her primary care health  CURRENT THERAPY: Observation  INTERVAL HISTORY: [name] 50 y.o. female returns for followup visit at one year. She was last seen back in 2011. Overall she seems to be doing well. She is gong to college full time she is about 3 semesters remaining. He denies any fevers chills night sweats headaches no shortness of breath chest pains palpitations. Her main concern is her weight gain. She has been diagnosed with asthma and she is unable to exercise to keep the weight off of her. She is trying to diet but states that it is not working for her. She also continues to undergo therapy for her bipolar disorder and is managed very well. Patient tells me that she mainly has manic episodes rather than depressive episodes.  MEDICAL HISTORY: Past Medical History  Diagnosis Date  . Breast cancer   . Asthma   . Clotting disorder   . Depression   . Bipolar affective disorder, depressed, mild     ALLERGIES:  is allergic to gabapentin; codeine; lamotrigine; and topiramate.  MEDICATIONS:  Current Outpatient Prescriptions  Medication Sig Dispense Refill  . albuterol (PROVENTIL)  (2.5 MG/3ML) 0.083% nebulizer solution Take 2.5 mg by nebulization every 6 (six) hours as needed.        Marland Kitchen atorvastatin (LIPITOR) 20 MG tablet Take 20 mg by mouth daily.        Marland Kitchen FLUoxetine (PROZAC) 20 MG capsule Take 20 mg by mouth daily.        Marland Kitchen lithium 600 MG capsule Take 600 mg by mouth 2 (two) times daily with a meal.        . risperiDONE (RISPERDAL) 1 MG tablet Take 1 mg by mouth daily.          SURGICAL HISTORY:  Past Surgical History  Procedure Date  . Breast lumpectomy     REVIEW OF SYSTEMS:  Constitutional: negative Eyes: negative Ears, nose, mouth, throat, and face: negative Respiratory: positive for asthma Cardiovascular: negative Gastrointestinal: negative Genitourinary:negative Hematologic/lymphatic: negative Musculoskeletal:negative Neurological: negative Behavioral/Psych: positive for Bipolar disorder with manic phase is.   PHYSICAL EXAMINATION: General appearance: alert, cooperative and moderately obese Head: Normocephalic, without obvious abnormality, atraumatic Neck: no adenopathy, no carotid bruit, no JVD, supple, symmetrical, trachea midline and thyroid not enlarged, symmetric, no tenderness/mass/nodules Lymph nodes: Cervical, supraclavicular, and axillary nodes normal. Resp: clear to auscultation bilaterally and normal percussion bilaterally Back: symmetric, no curvature. ROM normal. No CVA tenderness. Cardio: regular rate and rhythm, S1, S2 normal, no murmur, click, rub or gallop and normal apical impulse GI: soft, non-tender; bowel sounds normal; no masses,  no organomegaly Extremities: extremities normal, atraumatic, no cyanosis or edema Neurologic: Alert and oriented X 3, normal strength  and tone. Normal symmetric reflexes. Normal coordination and gait Mental status: Alert, oriented, thought content appropriate Sensory: normal Motor: grossly normal Reflexes: normal 2+ and symmetric Bilateral breasts are examined. The right breast does reveal a  central lumpectomy there is no evidence of masses no skin changes. Left breast no masses or nipple discharge.  ECOG PERFORMANCE STATUS: 0 - Asymptomatic  Blood pressure 138/91, pulse 98, temperature 98 F (36.7 C), temperature source Oral, height 5\' 5"  (1.651 m), weight 268 lb 1.6 oz (121.609 kg).  LABORATORY DATA: Lab Results  Component Value Date   WBC 5.2 11/07/2011   HGB 13.0 11/07/2011   HCT 39.1 11/07/2011   MCV 84.3 11/07/2011   PLT 203 11/07/2011      Chemistry      Component Value Date/Time   NA 142 02/21/2011 2320   K 4.2 02/21/2011 2320   CL 111 02/21/2011 2320   CO2 25 02/21/2011 2320   BUN 11 02/21/2011 2320   CREATININE 0.95 02/21/2011 2320      Component Value Date/Time   CALCIUM 9.4 02/21/2011 2320   ALKPHOS 88 02/21/2011 2320   AST 20 02/21/2011 2320   ALT 28 02/21/2011 2320   BILITOT 0.5 02/21/2011 2320       RADIOGRAPHIC STUDIES:  No results found.  ASSESSMENT: 50 year old female with  #1 invasive ductal carcinoma of the right breast originally diagnosed in 2004. At that time she underwent neoadjuvant chemotherapy consisting of Adriamycin Cytoxan. This was followed by lumpectomy followed by radiation therapy. Her tumor was estrogen receptor and progesterone receptor positive. So after completion of radiation therapy she received 5 years of Arimidex. Patient also has had bilateral salpingo-oophorectomies. She completed her Arimidex therapy in 2010.  #2 patient has had genetic testing performed and it was read out to be negative both BRCA1 and BRCA2 gene mutation.  #3 patient is concerned about her weight gain.   #4 asthma   PLAN:   #1 breast cancer we will continue to observe her on a yearly basis. She has had her mammogram which was negative.  #2 she will be referred to the survivor [facility] for counseling and management of long-term sequelae of her treatments.  #3 she will continue to see her primary care physician for all of her ongoing  medical needs.   All questions were answered. The patient knows to call the clinic with any problems, questions or concerns. We can certainly see the patient much sooner if necessary.  I spent 20 minutes counseling the patient face to face. The total time spent in the appointment was 30 minutes.    Drue Second, MD Medical/Oncology [facility] [phone] (beeper) [phone] (Office)  11/07/2011, 2:40 PM

## 2011-11-07 NOTE — Telephone Encounter (Signed)
Gv pt appt for jan2013 °

## 2011-11-13 ENCOUNTER — Emergency Department (HOSPITAL_COMMUNITY): Payer: BC Managed Care – PPO

## 2011-11-13 ENCOUNTER — Other Ambulatory Visit: Payer: Self-pay

## 2011-11-13 ENCOUNTER — Encounter (HOSPITAL_COMMUNITY): Payer: Self-pay | Admitting: *Deleted

## 2011-11-13 ENCOUNTER — Observation Stay (HOSPITAL_COMMUNITY)
Admission: EM | Admit: 2011-11-13 | Discharge: 2011-11-15 | Disposition: A | Discharge status: Home / Self Care | Payer: BC Managed Care – PPO | Attending: Internal Medicine | Admitting: Internal Medicine

## 2011-11-13 DIAGNOSIS — Z23 Encounter for immunization: Secondary | ICD-10-CM | POA: Insufficient documentation

## 2011-11-13 DIAGNOSIS — E785 Hyperlipidemia, unspecified: Secondary | ICD-10-CM

## 2011-11-13 DIAGNOSIS — J45909 Unspecified asthma, uncomplicated: Secondary | ICD-10-CM | POA: Insufficient documentation

## 2011-11-13 DIAGNOSIS — R61 Generalized hyperhidrosis: Secondary | ICD-10-CM | POA: Insufficient documentation

## 2011-11-13 DIAGNOSIS — Z853 Personal history of malignant neoplasm of breast: Secondary | ICD-10-CM | POA: Insufficient documentation

## 2011-11-13 DIAGNOSIS — Z86711 Personal history of pulmonary embolism: Secondary | ICD-10-CM | POA: Insufficient documentation

## 2011-11-13 DIAGNOSIS — F3131 Bipolar disorder, current episode depressed, mild: Secondary | ICD-10-CM | POA: Insufficient documentation

## 2011-11-13 DIAGNOSIS — R0602 Shortness of breath: Secondary | ICD-10-CM | POA: Insufficient documentation

## 2011-11-13 DIAGNOSIS — F319 Bipolar disorder, unspecified: Secondary | ICD-10-CM | POA: Diagnosis present

## 2011-11-13 DIAGNOSIS — R079 Chest pain, unspecified: Principal | ICD-10-CM | POA: Diagnosis present

## 2011-11-13 HISTORY — DX: Personal history of pulmonary embolism: Z86.711

## 2011-11-13 LAB — BASIC METABOLIC PANEL
CO2: 24 mEq/L (ref 19–32)
Chloride: 106 mEq/L (ref 96–112)
Creatinine, Ser: 0.72 mg/dL (ref 0.50–1.10)
Sodium: 140 mEq/L (ref 135–145)

## 2011-11-13 LAB — CBC
HCT: 38.5 % (ref 36.0–46.0)
MCV: 85.4 fL (ref 78.0–100.0)
RBC: 4.51 MIL/uL (ref 3.87–5.11)
WBC: 5.4 10*3/uL (ref 4.0–10.5)

## 2011-11-13 MED ORDER — FLUOXETINE HCL 20 MG PO CAPS
20.0000 mg | ORAL_CAPSULE | Freq: Every day | ORAL | Status: DC
  Filled 2011-11-13: qty 1

## 2011-11-13 MED ORDER — ONDANSETRON HCL 4 MG/2ML IJ SOLN
4.0000 mg | Freq: Once | INTRAMUSCULAR | Status: AC
  Administered 2011-11-13: 4 mg via INTRAVENOUS
  Filled 2011-11-13: qty 2

## 2011-11-13 MED ORDER — IOHEXOL 350 MG/ML SOLN
100.0000 mL | Freq: Once | INTRAVENOUS | Status: AC | PRN
  Administered 2011-11-13: 100 mL via INTRAVENOUS

## 2011-11-13 MED ORDER — RISPERIDONE 1 MG PO TABS
1.0000 mg | ORAL_TABLET | Freq: Every day | ORAL | Status: DC
  Filled 2011-11-13: qty 1

## 2011-11-13 MED ORDER — LITHIUM CARBONATE ER 300 MG PO TBCR
600.0000 mg | EXTENDED_RELEASE_TABLET | Freq: Two times a day (BID) | ORAL | Status: DC
  Filled 2011-11-13: qty 2

## 2011-11-13 MED ORDER — FLUOXETINE HCL 20 MG PO CAPS
20.0000 mg | ORAL_CAPSULE | Freq: Once | ORAL | Status: AC
  Administered 2011-11-13: 20 mg via ORAL
  Filled 2011-11-13: qty 1

## 2011-11-13 MED ORDER — FLUOXETINE HCL 20 MG PO CAPS: 20.0000 mg | ORAL_CAPSULE | Freq: Every day | ORAL | Status: DC

## 2011-11-13 MED ORDER — MORPHINE SULFATE 4 MG/ML IJ SOLN
4.0000 mg | Freq: Once | INTRAMUSCULAR | Status: AC
  Administered 2011-11-13: 4 mg via INTRAVENOUS
  Filled 2011-11-13: qty 1

## 2011-11-13 MED ORDER — RISPERIDONE 1 MG PO TABS
1.0000 mg | ORAL_TABLET | Freq: Once | ORAL | Status: AC
  Administered 2011-11-13: 1 mg via ORAL
  Filled 2011-11-13: qty 1

## 2011-11-13 MED ORDER — ASPIRIN 81 MG PO CHEW
324.0000 mg | CHEWABLE_TABLET | Freq: Once | ORAL | Status: AC
  Administered 2011-11-13: 243 mg via ORAL
  Filled 2011-11-13: qty 4

## 2011-11-13 MED ORDER — LITHIUM CARBONATE ER 300 MG PO TBCR
600.0000 mg | EXTENDED_RELEASE_TABLET | Freq: Two times a day (BID) | ORAL | Status: DC
  Administered 2011-11-13: 600 mg via ORAL
  Filled 2011-11-13: qty 2

## 2011-11-13 MED ORDER — LITHIUM CARBONATE 300 MG PO CAPS
600.0000 mg | ORAL_CAPSULE | Freq: Once | ORAL | Status: DC
  Filled 2011-11-13: qty 2

## 2011-11-13 MED ORDER — RISPERIDONE 1 MG PO TABS: 1.0000 mg | ORAL_TABLET | Freq: Two times a day (BID) | ORAL | Status: DC

## 2011-11-13 NOTE — ED Notes (Signed)
Began having chest pain this morning. Describes it as pressure in middle of chest

## 2011-11-13 NOTE — ED Notes (Signed)
Pt is requesting to have her lithium, prozac and risperidal. Will notify physician.

## 2011-11-13 NOTE — ED Notes (Signed)
Pt reports approx 25 min ago began having severe burning in chest that radiating into jaws associated with sob and nausea.

## 2011-11-13 NOTE — ED Notes (Signed)
Attempted PIV x 2 without success; IV team paged 

## 2011-11-13 NOTE — ED Notes (Signed)
ekg repeated, shown to edp

## 2011-11-13 NOTE — ED Provider Notes (Signed)
History     CSN: [id number] Arrival date & time: 11/13/2011 12:06 PM   First MD Initiated Contact with Patient 11/13/11 1329      Chief Complaint  Patient presents with  . Chest Pain    (Consider location/radiation/quality/duration/timing/severity/associated sxs/prior treatment) The history is provided by the patient.   patient is a 50 year old female with a history of PE (11 years ago) who presents with chest pain. This started about 3 hours prior to arrival. It was abrupt in onset. The pain is burning in nature and located in the low center of her chest.  This pain radiated up the center of her chest into her neck on both sides. This pain lasts about 20 minutes. It improved but she still has had significant discomfort in her lower chest. This is described more as a pressure. She notes mild associated dyspnea with this as well as lightheadedness and diaphoresis. She did not have any syncope. No recent cough or infectious symptoms. This pain occurred while at rest and there has been no change with exertion. There is also no change in the pain when laying flat or sitting up. Patient has history of PE and states that at that time she mostly had dyspnea. On arrival pain is almost resolved.  Overall severity moderate.  Past Medical History  Diagnosis Date  . Asthma   . Clotting disorder   . Depression   . Bipolar affective disorder, depressed, mild   . Breast cancer     Past Surgical History  Procedure Date  . Breast lumpectomy     Family History  Problem Relation Age of Onset  . Cancer Father   . Diabetes Maternal Aunt   . Cancer Maternal Aunt   . Cancer Paternal Grandmother   CAD in father  History  Substance Use Topics  . Smoking status: Former Games developer  . Smokeless tobacco: Not on file  . Alcohol Use: No    OB History    Grav Para Term Preterm Abortions TAB SAB Ect Mult Living                  Review of Systems  Constitutional: Negative for fever and chills.  HENT:  Negative for facial swelling.   Eyes: Negative for visual disturbance.  Respiratory: Negative for cough, chest tightness and wheezing.   Cardiovascular: Positive for chest pain. Negative for palpitations and leg swelling.  Gastrointestinal: Negative for nausea, vomiting, abdominal pain and diarrhea.  Genitourinary: Negative for difficulty urinating.  Skin: Negative for rash.  Neurological: Negative for weakness and numbness.  Psychiatric/Behavioral: Negative for behavioral problems and confusion.  All other systems reviewed and are negative.    Allergies  Gabapentin; Codeine; Lamotrigine; and Topiramate  Home Medications   Current Outpatient Rx  Name Route Sig Dispense Refill  . ALBUTEROL SULFATE (2.5 MG/3ML) 0.083% IN NEBU Nebulization Take 2.5 mg by nebulization every 6 (six) hours as needed. Shortness of breath    . ATORVASTATIN CALCIUM 20 MG PO TABS Oral Take 20 mg by mouth daily.      Marland Kitchen FLUOXETINE HCL 20 MG PO CAPS Oral Take 20 mg by mouth daily.      Marland Kitchen LITHIUM CARBONATE 600 MG PO CAPS Oral Take 600 mg by mouth 2 (two) times daily with a meal.     . RISPERIDONE 1 MG PO TABS Oral Take 1 mg by mouth daily.        BP 131/88  Pulse 81  Temp(Src) 98.2 F (36.8 C) (Oral)  Resp 16  SpO2 96%  Physical Exam  Nursing note and vitals reviewed. Constitutional: She is oriented to person, place, and time. She appears well-developed and well-nourished. No distress.  HENT:  Head: Normocephalic.  Nose: Nose normal.  Eyes: EOM are normal.  Neck: Normal range of motion. Neck supple.  Cardiovascular: Normal rate, regular rhythm and intact distal pulses.   No murmur heard. Pulmonary/Chest: Effort normal and breath sounds normal. No respiratory distress. She has no wheezes. She exhibits no tenderness.  Abdominal: Soft. She exhibits no distension. There is no tenderness.  Musculoskeletal: Normal range of motion. She exhibits no edema and no tenderness.       No calf TTP  Neurological:  She is alert and oriented to person, place, and time.       Normal strength  Skin: Skin is warm and dry. No rash noted. She is not diaphoretic.  Psychiatric: She has a normal mood and affect. Her behavior is normal. Thought content normal.    ED Course  Procedures (including critical care time)  Date: 11/13/2011  Rate: 78  Rhythm: normal sinus rhythm  QRS Axis: normal  Intervals: normal  ST/T Wave abnormalities: nonspecific ST changes  Conduction Disutrbances:none  Narrative Interpretation: No acute ischemia  Old EKG Reviewed: unchanged   Date: 11/13/2011  Rate: 80  Rhythm: normal sinus rhythm  QRS Axis: normal  Intervals: normal  ST/T Wave abnormalities: nonspecific ST changes  Conduction Disutrbances:none  Narrative Interpretation: no acute ischemia  Old EKG Reviewed: unchanged      Labs Reviewed  BASIC METABOLIC PANEL  CBC  TROPONIN I  I-STAT TROPONIN I  LITHIUM LEVEL  TROPONIN I   Dg Chest 2 View  11/13/2011  *RADIOLOGY REPORT*  Clinical Data: Sharp mid chest pain.  History of breast cancer.  CHEST - 2 VIEW  Comparison: Two-view chest 09/02/2010 [facility].  Findings: The heart size is normal.  Mild interstitial coarsening is chronic.  No focal airspace disease is evident.  The visualized soft tissues and bony thorax are unremarkable.  IMPRESSION: Negative chest.  Original Report Authenticated By: Jamesetta Orleans. MATTERN, M.D.     No diagnosis found. Diagnosis: Chest Pain   MDM   Patient with a history of prior PE 10 years ago who presents with chest pain. No documented history of hypercoagulable state. Patient states her PE was related to numerous long trips.  No signs or symptoms of DVT.  Has history of breast cancer but not active cancer. Also no longer smokes. Patient did have atypical presentation with prior PE, we will rule out with CTA of chest.   Chest pain is nonexertional but the radiation and overall nature is concerning for possible cardiac  etiology. If PE study is negative we'll place patient on low risk chest pain on some plan for stress test in the morning. Care transferred to CDU provider.        Milus Glazier 11/13/11 1739

## 2011-11-13 NOTE — H&P (Signed)
Hospital Admission Note Date: 11/13/2011  Patient name: [name]           Medical record number: [id number] Date of birth: 1961-10-08           Age: 50 y.o.   Gender: female    PCP:   Gwynneth Aliment, MD   Chief Complaint:  Chest pain  HPI: [name] is a 50 y.o. female was past medical history of PE, bipolar affective disorder and breast cancer. Patient came into the hospital complaining about chest pain. Patient was in her usual state of health until this morning when she developed burning chest pain which was soon turned into pressure. The pain was a 10/10, burning then it becomes pressure, substernal, does radiate to her neck and she did not notice anything increasing or decreasing the pain. The patient stayed in the TCU for some time for the chest pain protocol and they ruled her out because she is asthmatic and she can not do the exercise stress test. Upon initial evaluation in the emergency department, first assistant cardiac enzymes were negative. CT angio of the chest was negative for PE or other pulmonary diseases. Patient will be admitted to the hospital for further evaluation.  Past Medical History: Past Medical History  Diagnosis Date  . Asthma   . Clotting disorder   . Depression   . Bipolar affective disorder, depressed, mild     Remote history of suicidal attempts  . Breast cancer     In 2005 treated with surgery and Arimidex for 5 years  . History of pulmonary embolism     Bilateral in 2001   Past Surgical History  Procedure Date  . Breast lumpectomy     Medications: Prior to Admission medications   Medication Sig Start Date End Date Taking? Authorizing Provider  albuterol (PROVENTIL) (2.5 MG/3ML) 0.083% nebulizer solution Take 2.5 mg by nebulization every 6 (six) hours as needed. Shortness of breath   Yes Historical Provider, MD  atorvastatin (LIPITOR) 20 MG tablet Take 20 mg by mouth daily.     Yes Historical Provider, MD  FLUoxetine (PROZAC) 20  MG capsule Take 20 mg by mouth daily.     Yes Historical Provider, MD  lithium 600 MG capsule Take 600 mg by mouth 2 (two) times daily with a meal.    Yes Historical Provider, MD  risperiDONE (RISPERDAL) 1 MG tablet Take 1 mg by mouth daily.     Yes Historical Provider, MD    Allergies:   Allergies  Allergen Reactions  . Gabapentin   . Codeine     REACTION: lethargic  . Lamotrigine     REACTION: lips swell  . Topiramate     REACTION: hallucinations    Social History:  reports that she has quit smoking. She does not have any smokeless tobacco history on file. She reports that she does not drink alcohol. Her drug history not on file.  Family History: Family History  Problem Relation Age of Onset  . Cancer Father   . Diabetes Maternal Aunt   . Cancer Maternal Aunt   . Cancer Paternal Grandmother     Review of Systems:  Constitutional: negative for anorexia, fevers and sweats Eyes: negative for irritation, redness and visual disturbance Ears, nose, mouth, throat, and face: negative for earaches, epistaxis, nasal congestion and sore throat Respiratory: negative for cough, dyspnea on exertion, sputum and wheezing Cardiovascular: negative for chest pain, dyspnea, lower extremity edema, orthopnea, palpitations and syncope Gastrointestinal: negative  for abdominal pain, constipation, diarrhea, melena, nausea and vomiting Genitourinary:negative for dysuria, frequency and hematuria Hematologic/lymphatic: negative for bleeding, easy bruising and lymphadenopathy Musculoskeletal:negative for arthralgias, muscle weakness and stiff joints Neurological: negative for coordination problems, gait problems, headaches and weakness Endocrine: negative for diabetic symptoms including polydipsia, polyuria and weight loss Allergic/Immunologic: negative for anaphylaxis, hay fever and urticaria  Physical Exam: BP 123/74  Pulse 74  Temp(Src) 98.3 F (36.8 C) (Oral)  Resp 15  SpO2 95% General  appearance: alert, cooperative and no distress  Head: Normocephalic, without obvious abnormality, atraumatic  Eyes: conjunctivae/corneas clear. PERRL, EOM's intact. Fundi benign.  Nose: Nares normal. Septum midline. Mucosa normal. No drainage or sinus tenderness.  Throat: lips, mucosa, and tongue normal; teeth and gums normal  Neck: Supple, no masses, no cervical lymphadenopathy, no JVD appreciated, no meningeal signs Resp: clear to auscultation bilaterally  Chest wall: no tenderness  Cardio: regular rate and rhythm, S1, S2 normal, no murmur, click, rub or gallop  GI: soft, non-tender; bowel sounds normal; no masses, no organomegaly  Extremities: extremities normal, atraumatic, trace pedal edema  Skin: Skin color, texture, turgor normal. No rashes or lesions  Neurologic: Alert and oriented X 3, normal strength and tone. Normal symmetric reflexes. Normal coordination and gait  Labs on Admission:   [facility] 11/13/11 1327  NA 140  K 3.7  CL 106  CO2 24  GLUCOSE 84  BUN 9  CREATININE 0.72  CALCIUM 9.0  MG --  PHOS --   Basename 11/13/11 1327  WBC 5.4  NEUTROABS --  HGB 12.9  HCT 38.5  MCV 85.4  PLT 217    Basename 11/13/11 1622 11/13/11 1516  CKTOTAL -- --  CKMB -- --  CKMBINDEX -- --  TROPONINI <0.30 <0.30   Radiological Exams on Admission: Dg Chest 2 View  11/13/2011  *RADIOLOGY REPORT*  Clinical Data: Sharp mid chest pain.  History of breast cancer.  CHEST - 2 VIEW  Comparison: Two-view chest 09/02/2010 [facility].  Findings: The heart size is normal.  Mild interstitial coarsening is chronic.  No focal airspace disease is evident.  The visualized soft tissues and bony thorax are unremarkable.  IMPRESSION: Negative chest.  Original Report Authenticated By: Jamesetta Orleans. MATTERN, M.D.   Ct Angio Chest W/cm &/or Wo Cm  11/13/2011  *RADIOLOGY REPORT*  IMPRESSION:   1.  No evidence of pulmonary embolism.  2.  Low lung volumes.  No acute cardiopulmonary disease.       IMPRESSION: Present on Admission:  .Chest pain .HYPERLIPIDEMIA .DYSPNEA .History of pulmonary embolism .Asthma  Assessment/Plan 1. Chest pain: Patient is high risk for acute coronary syndrome. Because of her high risk factors including obesity, hyperlipidemia and family history. Patient will be admitted to the hospital to rule out acute coronary syndrome. Probably she'll benefit from stress test. I ordered for a nuclear med stress test in the morning. Patient will be n.p.o.  2. Hyperlipidemia. Patient is on a study medication. We'll continue on that lipid profile be obtained in the morning.  3. Asthma: Patient does not tolerate exercise. The stress will be pharmacological.  Wanisha Shiroma A 11/13/2011, 10:32 PM

## 2011-11-13 NOTE — ED Provider Notes (Signed)
  Physical Exam  BP 123/74  Pulse 74  Temp(Src) 98.3 F (36.8 C) (Oral)  Resp 15  SpO2 95%  Physical Exam  ED Course  Procedures  MDM 10:12 PM Patient placed on CDU protocol for chest pain. Patient just arrived in CDU approximately one hour prior. Ports she continues to have a 6/10 chest pain that has not improved since her arrival. Reports this morning while cleaning her kitchen she had a sudden onset of burning substernal chest pain that suddenly became pressure. Reports began having diaphoresis and radiation of pain to her jaw. Patient has a significant history of elevated cholesterol, former smoking one half years ago and a family history of cardiac disease. Reports a history of asthma and is unable to complete the treadmill stress test severe asthma. Asthma also excludes her from the coronary CT T2 metoprolol dosing. I've spoken with Dr. Arthor Captain concerning admission for chest pain rule out. He will come see the patient for admission.     Thomasene Lot, [place] 11/13/11 2214

## 2011-11-13 NOTE — ED Provider Notes (Signed)
History     CSN: [id number] Arrival date & time: 11/13/2011 12:06 PM   First MD Initiated Contact with Patient 11/13/11 1329      Chief Complaint  Patient presents with  . Chest Pain    (Consider location/radiation/quality/duration/timing/severity/associated sxs/prior treatment) HPI  Past Medical History  Diagnosis Date  . Asthma   . Clotting disorder   . Depression   . Bipolar affective disorder, depressed, mild   . Breast cancer     Past Surgical History  Procedure Date  . Breast lumpectomy     Family History  Problem Relation Age of Onset  . Cancer Father   . Diabetes Maternal Aunt   . Cancer Maternal Aunt   . Cancer Paternal Grandmother     History  Substance Use Topics  . Smoking status: Former Games developer  . Smokeless tobacco: Not on file  . Alcohol Use: No    OB History    Grav Para Term Preterm Abortions TAB SAB Ect Mult Living                  Review of Systems  Allergies  Gabapentin; Codeine; Lamotrigine; and Topiramate  Home Medications   Current Outpatient Rx  Name Route Sig Dispense Refill  . ALBUTEROL SULFATE (2.5 MG/3ML) 0.083% IN NEBU Nebulization Take 2.5 mg by nebulization every 6 (six) hours as needed. Shortness of breath    . ATORVASTATIN CALCIUM 20 MG PO TABS Oral Take 20 mg by mouth daily.      Marland Kitchen FLUOXETINE HCL 20 MG PO CAPS Oral Take 20 mg by mouth daily.      Marland Kitchen LITHIUM CARBONATE 600 MG PO CAPS Oral Take 600 mg by mouth 2 (two) times daily with a meal.     . RISPERIDONE 1 MG PO TABS Oral Take 1 mg by mouth daily.        BP 131/88  Pulse 81  Temp(Src) 98.2 F (36.8 C) (Oral)  Resp 16  SpO2 96%  Physical Exam  ED Course  Procedures (including critical care time)   Labs Reviewed  BASIC METABOLIC PANEL  CBC  I-STAT TROPONIN I  TROPONIN I   Dg Chest 2 View  11/13/2011  *RADIOLOGY REPORT*  Clinical Data: Sharp mid chest pain.  History of breast cancer.  CHEST - 2 VIEW  Comparison: Two-view chest 09/02/2010 [facility].  Findings: The heart size is normal.  Mild interstitial coarsening is chronic.  No focal airspace disease is evident.  The visualized soft tissues and bony thorax are unremarkable.  IMPRESSION: Negative chest.  Original Report Authenticated By: Jamesetta Orleans. MATTERN, M.D.     No diagnosis found.    MDM   Patient presenting with the centralized burning chest pain that radiated to bilateral jaws, causing shortness of breath, diaphoresis, nausea which lasted about 20 minutes and then resolved. Patient now does states that she is fatigued. Patient well-appearing on exam. Does have a history of PE in the past for which she took Coumadin for one year. Denies any recent travel. Normal vital signs here. Will rule out PE.  Gen: Well appearing in no acute distress Resp: Clear to auscultation bilaterally no wheezing, rhonchi, rales CV: Regular rate and rhythm. No murmurs. No chest wall pain. Abd: Soft nontender and nondistended Ext: 2+ distal pulses. No extremity pain or edema.  Plan: Rule out PE with CTA of the chest. Place patient on low risk chest pain protocol. TIMI 0 with a good story.  Gwyneth Sprout, MD 11/13/11 [phone]

## 2011-11-13 NOTE — ED Notes (Signed)
Pt states that her lithium is 600mg  bid, prozac is 20mg  po daily, and risperidal is 1 mg BID. Pt food tray is dilivered.

## 2011-11-14 ENCOUNTER — Observation Stay (HOSPITAL_COMMUNITY): Payer: BC Managed Care – PPO

## 2011-11-14 ENCOUNTER — Encounter (HOSPITAL_COMMUNITY): Payer: Self-pay | Admitting: *Deleted

## 2011-11-14 DIAGNOSIS — F319 Bipolar disorder, unspecified: Secondary | ICD-10-CM | POA: Diagnosis present

## 2011-11-14 DIAGNOSIS — R079 Chest pain, unspecified: Secondary | ICD-10-CM

## 2011-11-14 LAB — CBC
HCT: 34.8 % — ABNORMAL LOW (ref 36.0–46.0)
Hemoglobin: 11.6 g/dL — ABNORMAL LOW (ref 12.0–15.0)
MCV: 85.5 fL (ref 78.0–100.0)
Platelets: 193 10*3/uL (ref 150–400)
RBC: 4.07 MIL/uL (ref 3.87–5.11)
RDW: 14 % (ref 11.5–15.5)
WBC: 6.4 10*3/uL (ref 4.0–10.5)
WBC: 6.3 10*3/uL (ref 4.0–10.5)

## 2011-11-14 LAB — BASIC METABOLIC PANEL
Chloride: 104 mEq/L (ref 96–112)
GFR calc Af Amer: >90 mL/min (ref >90)
Potassium: 3.8 mEq/L (ref 3.5–5.1)

## 2011-11-14 LAB — CARDIAC PANEL(CRET KIN+CKTOT+MB+TROPI)
Relative Index: 1.5 (ref 0.0–2.5)
Total CK: 143 U/L (ref 7–177)
Troponin I: <0.3 ng/mL (ref <0.30)

## 2011-11-14 LAB — LIPID PANEL
LDL Cholesterol: 82 mg/dL (ref 0–99)
VLDL: 26 mg/dL (ref 0–40)

## 2011-11-14 MED ORDER — MORPHINE SULFATE 2 MG/ML IJ SOLN: 1.0000 mg | INTRAMUSCULAR | Status: DC | PRN

## 2011-11-14 MED ORDER — SODIUM CHLORIDE 0.9 % IV BOLUS (SEPSIS)
1000.0000 mL | Freq: Once | INTRAVENOUS | Status: AC
  Administered 2011-11-14: 1000 mL via INTRAVENOUS

## 2011-11-14 MED ORDER — RISPERIDONE 1 MG PO TABS
1.0000 mg | ORAL_TABLET | Freq: Every day | ORAL | Status: DC
  Administered 2011-11-14: 1 mg via ORAL
  Filled 2011-11-14 (×2): qty 1

## 2011-11-14 MED ORDER — ONDANSETRON HCL 4 MG/2ML IJ SOLN: 4.0000 mg | Freq: Four times a day (QID) | INTRAMUSCULAR | Status: DC | PRN

## 2011-11-14 MED ORDER — REGADENOSON 0.4 MG/5ML IV SOLN
0.4000 mg | Freq: Once | INTRAVENOUS | Status: AC
  Administered 2011-11-14: 0.4 mg via INTRAVENOUS
  Filled 2011-11-14: qty 5

## 2011-11-14 MED ORDER — ONDANSETRON HCL 4 MG PO TABS: 4.0000 mg | ORAL_TABLET | Freq: Four times a day (QID) | ORAL | Status: DC | PRN

## 2011-11-14 MED ORDER — MORPHINE SULFATE 4 MG/ML IJ SOLN
4.0000 mg | Freq: Once | INTRAMUSCULAR | Status: AC
  Administered 2011-11-14: 4 mg via INTRAVENOUS
  Filled 2011-11-14: qty 1

## 2011-11-14 MED ORDER — SIMVASTATIN 40 MG PO TABS
40.0000 mg | ORAL_TABLET | Freq: Every day | ORAL | Status: DC
  Administered 2011-11-14 – 2011-11-15 (×2): 40 mg via ORAL
  Filled 2011-11-14 (×2): qty 1

## 2011-11-14 MED ORDER — INFLUENZA VIRUS VACC SPLIT PF IM SUSP
0.5000 mL | INTRAMUSCULAR | Status: AC
  Administered 2011-11-14: 0.5 mL via INTRAMUSCULAR
  Filled 2011-11-14: qty 0.5

## 2011-11-14 MED ORDER — LITHIUM CARBONATE 300 MG PO CAPS
600.0000 mg | ORAL_CAPSULE | Freq: Two times a day (BID) | ORAL | Status: DC
  Administered 2011-11-14 – 2011-11-15 (×4): 600 mg via ORAL
  Filled 2011-11-14 (×7): qty 2

## 2011-11-14 MED ORDER — FLUOXETINE HCL 20 MG PO CAPS
20.0000 mg | ORAL_CAPSULE | Freq: Every day | ORAL | Status: DC
  Administered 2011-11-14 – 2011-11-15 (×2): 20 mg via ORAL
  Filled 2011-11-14 (×2): qty 1

## 2011-11-14 MED ORDER — ENOXAPARIN SODIUM 40 MG/0.4ML ~~LOC~~ SOLN
40.0000 mg | Freq: Every day | SUBCUTANEOUS | Status: DC
  Administered 2011-11-14 – 2011-11-15 (×2): 40 mg via SUBCUTANEOUS
  Filled 2011-11-14 (×2): qty 0.4

## 2011-11-14 NOTE — ED Provider Notes (Signed)
Medical screening examination/treatment/procedure(s) were conducted as a shared visit with non-physician practitioner(s) and myself.  I personally evaluated the patient during the encounter  RRR. Pt continues to have CP. Atypical. Admitted for formal r/o  BP 96/40  Pulse 74  Temp(Src) 98.2 F (36.8 C) (Oral)  Resp 18  SpO2 96%   Forbes Cellar, MD 11/14/11 0025

## 2011-11-14 NOTE — Progress Notes (Signed)
   CARE MANAGEMENT NOTE 11/14/2011  Patient:  [name], [name]   Account Number:  [id number]  Date Initiated:  11/14/2011  Documentation initiated by:  Donn Pierini  Subjective/Objective Assessment:   Pt admitted with chest pain     Action/Plan:   PTA pt lived at home with spouse, was independent with ADLs   Anticipated DC Date:  11/15/2011   Anticipated DC Plan:  HOME/SELF CARE      DC Planning Services  CM consult      Choice offered to / List presented to:             Status of service:  In process, will continue to follow Medicare Important Message given?   (If response is "NO", the following Medicare IM given date fields will be blank) Date Medicare IM given:   Date Additional Medicare IM given:    Discharge Disposition:    Per UR Regulation:  Reviewed for med. necessity/level of care/duration of stay  Comments:  PCP- Dorothyann Peng  11/14/11- 1500- Donn Pierini RN, BSN [phone] Pt for stress test today, if negative possible discharge later today. No anticipated discharge needs.

## 2011-11-14 NOTE — Progress Notes (Signed)
PATIENT DETAILS Name: [name] Age: 50 y.o. Sex: female Date of Birth: [birth year] Admit Date: 11/13/2011 [id number] N, MD  Subjective: Chest pain-free  Objective: Vital signs in last 24 hours: Filed Vitals:   11/14/11 1208 11/14/11 1210 11/14/11 1212 11/14/11 1214  BP: 90/63 89/56 83/38  109/72  Pulse: 88 83 88 89  Temp:      TempSrc:      Resp:      Height:      Weight:      SpO2:        Weight change:   Body mass index is 44.43 kg/(m^2).  Intake/Output from previous day:  Intake/Output Summary (Last 24 hours) at 11/14/11 1351 Last data filed at 11/14/11 0200  Gross per 24 hour  Intake   1000 ml  Output      0 ml  Net   1000 ml    PHYSICAL EXAM: Gen Exam: Awake and alert with clear speech.   Neck: Supple, No JVD.   Chest: B/L Clear.   CVS: S1 S2 Regular, no murmurs.  Abdomen: soft, BS +, non tender, non distended.  Extremities: no edema, lower extremities warm to touch. Neurologic: Non Focal.   Skin: No Rash.   Wounds: N/A.    CONSULTS:  cardiology  LAB RESULTS: CBC  Lab 11/14/11 0520 11/14/11 0230 11/13/11 1327  WBC 6.3 6.4 5.4  HGB 11.2* 11.6* 12.9  HCT 34.0* 34.8* 38.5  PLT 193 202 217  MCV 86.3 85.5 85.4  MCH 28.4 28.5 28.6  MCHC 32.9 33.3 33.5  RDW 14.0 13.8 13.7  LYMPHSABS -- -- --  MONOABS -- -- --  EOSABS -- -- --  BASOSABS -- -- --  BANDABS -- -- --    Chemistries   Lab 11/14/11 0520 11/13/11 1327  NA 137 140  K 3.8 3.7  CL 104 106  CO2 24 24  GLUCOSE 133* 84  BUN 10 9  CREATININE 0.67 0.72  CALCIUM 8.1* 9.0  MG -- --    GFR Estimated Creatinine Clearance: 112.1 ml/min (by C-G formula based on Cr of 0.67).  Coagulation profile No results found for this basename: INR:5,PROTIME:5 in the last 168 hours  Cardiac Enzymes  Lab 11/14/11 0239 11/13/11 1622 11/13/11 1516  CKMB 2.1 -- --  TROPONINI <0.30 <0.30 <0.30  MYOGLOBIN -- -- --     Lab 11/14/11 0230  POCBNP 86.2   No results found for this  basename: DDIMER:2 in the last 72 hours No results found for this basename: HGBA1C:2 in the last 72 hours  Basename 11/14/11 0520  CHOL 154  HDL 46  LDLCALC 82  TRIG 132  CHOLHDL 3.3  LDLDIRECT --   No results found for this basename: TSH,T4TOTAL,FREET3,T3FREE,THYROIDAB in the last 72 hours No results found for this basename: VITAMINB12:2,FOLATE:2,FERRITIN:2,TIBC:2,IRON:2,RETICCTPCT:2 in the last 72 hours No results found for this basename: LIPASE:2,AMYLASE:2 in the last 72 hours  Urine Studies No results found for this basename: UACOL:2,UAPR:2,USPG:2,UPH:2,UTP:2,UGL:2,UKET:2,UBIL:2,UHGB:2,UNIT:2,UROB:2,ULEU:2,UEPI:2,UWBC:2,URBC:2,UBAC:2,CAST:2,CRYS:2,UCOM:2,BILUA:2 in the last 72 hours  MICROBIOLOGY: No results found for this or any previous visit (from the past 240 hour(s)).  RADIOLOGY STUDIES/RESULTS: Dg Chest 2 View  11/13/2011  *RADIOLOGY REPORT*  Clinical Data: Lambert Mody mid chest pain.  History of breast cancer.  CHEST - 2 VIEW  Comparison: Two-view chest 09/02/2010 [facility].  Findings: The heart size is normal.  Mild interstitial coarsening is chronic.  No focal airspace disease is evident.  The visualized soft tissues and bony thorax are unremarkable.  IMPRESSION: Negative  chest.  Original Report Authenticated By: Jamesetta Orleans. MATTERN, M.D.   Ct Angio Chest W/cm &/or Wo Cm  11/13/2011  *RADIOLOGY REPORT*  Clinical Data:  Severe chest pain radiating into the trauma, associated with shortness of breath.  Acute onset earlier today.  CT ANGIOGRAPHY CHEST WITH CONTRAST 11/13/2011:  Technique:  Multidetector CT imaging of the chest was performed using the standard protocol during bolus administration of intravenous contrast.  Multiplanar CT image reconstructions including MIPs were obtained to evaluate the vascular anatomy.  Contrast: OMNIPAQUE IOHEXOL 350 MG/ML IV.  Comparison:  CTA chest 08/02/2010 [facility].  Findings:  Contrast opacification of the  pulmonary arteries is good no filling defects within either main pulmonary artery or their branches in either lung to suggest pulmonary embolism.  Heart size upper normal and stable.  No pericardial effusion.  No visible coronary artery calcification.  No visible atherosclerosis involving the thoracic or upper abdominal aorta or their visualized branches.  Low lung volumes account for atelectasis in the lower lobes.  Lungs otherwise clear without localized airspace consolidation, interstitial disease, or parenchymal nodules or masses.  No pleural effusions.  Scattered normal-sized mediastinal, hilar, and axillary lymph nodes; no significant lymphadenopathy.  Surgical clips in the right breast from prior lumpectomy and in the right axilla from prior node dissection.  Visualized thyroid gland unremarkable.  Calcified granuloma in the liver near the dome, unchanged; no significant abnormalities involving the visualized upper abdomen. Small focus of accessory splenic tissue medial to the lower pole of the spleen.  Bone window images demonstrate mild diffuse thoracic spondylosis.  Review of the MIP images confirms the above findings.  IMPRESSION:  1.  No evidence of pulmonary embolism. 2.  Low lung volumes.  No acute cardiopulmonary disease.  Original Report Authenticated By: Arnell Sieving, M.D.    MEDICATIONS: Scheduled Meds:   . aspirin  324 mg Oral Once  . enoxaparin  40 mg Subcutaneous Daily  . FLUoxetine  20 mg Oral Once  . FLUoxetine  20 mg Oral Daily  . influenza  inactive virus vaccine  0.5 mL Intramuscular Tomorrow-1000  . lithium  600 mg Oral BID WC  .  morphine injection  4 mg Intravenous Once  .  morphine injection  4 mg Intravenous Once  . ondansetron (ZOFRAN) IV  4 mg Intravenous Once  . regadenoson  0.4 mg Intravenous Once  . risperiDONE  1 mg Oral Once  . risperiDONE  1 mg Oral QHS  . simvastatin  40 mg Oral q1800  . sodium chloride  1,000 mL Intravenous Once  . DISCONTD:  FLUoxetine  20 mg Oral Daily  . DISCONTD: FLUoxetine  20 mg Oral Daily  . DISCONTD: lithium carbonate  600 mg Oral Q12H  . DISCONTD: lithium carbonate  600 mg Oral Q12H  . DISCONTD: lithium carbonate  600 mg Oral Once  . DISCONTD: risperiDONE  1 mg Oral BID  . DISCONTD: risperiDONE  1 mg Oral QHS   Continuous Infusions:  PRN Meds:.iohexol, morphine, ondansetron (ZOFRAN) IV, ondansetron  Antibiotics: Anti-infectives    None      Assessment/Plan: Patient Active Hospital Problem List: Chest pain    Assessment: Chest pain-free, enzymes negative.    Plan: Await results of Lexiscan, if negative will discharge later today   HYPERLIPIDEMIA  Assessment: LDL 82    Plan: Continue with statin   H/O Bronchial Asthma   Assessment: Stable    Plan: As needed inhalers, outpatient followup with primary pulmonologist.  History of pulmonary embolism    Assessment: CT angiogram negative for new pulmonary embolism.    Plan: Continue to monitor   Bipolar affective disorder  Assessment: Mood stable    Plan: Continue with current medications.  Disposition: Home if stress test negative.  DVT Prophylaxis: Subcutaneous Lovenox.  Code Status: Full code  Maretta Bees, fifth  MD. 11/14/2011, 1:51 PM

## 2011-11-14 NOTE — Progress Notes (Signed)
Utilization review complete 

## 2011-11-14 NOTE — ED Notes (Signed)
Attempt to call report to 2000. Patient still having chest pressure which she rates as 2/10. Midlevel notified. Will continue to evaluate patient

## 2011-11-14 NOTE — Progress Notes (Signed)
Patient seen in Nuclear for Lexiscan stress test.  Pretest: No chest pain/SOB. She feels tired and generally weak. VSS. EKG NSR nonspecific ST upsloping II, avF, V4-V6.  During Test: EKG remains nonacute. Patient "feels weird" but denies CP/SOB. HR increased slightly to 106 then came down, VSS.  Post Test: In recovery, patient's BP gradually came down to the 90's then 80's systolic with nuclear cuff. BP variable over last few vitals documented, but was 96/40 overnight. She feels better than mid-test, "but it's hard to describe." Denies dizziness, CP. Repeat pressures 111/75 and 109/72 with another cuff to verify. Patient feels at state she felt pre-test, tired and weak.  Await interpretation of images.  Nashira Mcglynn PA-C 11/14/2011

## 2011-11-14 NOTE — Consult Note (Signed)
CARDIOLOGY CONSULT NOTE  Patient ID: [name] MRN: [id number] DOB/AGE: July 03, 1961 50 y.o.  Admit date: 11/13/2011 Referring Physician:  [facility] Primary Physician: Gwynneth Aliment, MD Primary Cardiologist:  New Reason for Consultation: Chest Pain  Active Problems:  HYPERLIPIDEMIA  DYSPNEA  Chest pain  History of pulmonary embolism  Asthma   HPI:   [name] is a 51 y.o. female was past medical history of PE, bipolar affective disorder and breast cancer. Patient came into the hospital complaining about chest pain. Patient was in her usual state of health 12/4  when she developed burning chest pain which was soon turned into pressure. The pain was a 10/10, burning then it becomes pressure, substernal, does radiate to her neck and she did not notice anything increasing or decreasing the pain. The patient stayed in the TCU for some time for the chest pain protocol and they ruled her out because she is asthmatic and she can not do the exercise stress test. Upon initial evaluation in the emergency department, first assistant cardiac enzymes were negative. CT angio of the chest was negative for PE or other pulmonary diseases. Patient will be admitted to the hospital for further evaluation. Still with some tightness this am.  She has exercise induced asthma and cannot walk on a treadmill  Discussed risk stratification with her.  Dont think cath indicated at this point  Lexiscan myovue appr.   @ROS @ All other systems reviewed and negative except as noted above  Past Medical History  Diagnosis Date  . Asthma   . Depression   . Bipolar affective disorder, depressed, mild     Remote history of suicidal attempts  . Breast cancer     In 2005 treated with surgery and Arimidex for 5 years  . History of pulmonary embolism     Bilateral in 2001    Family History  Problem Relation Age of Onset  . Cancer Father   . Diabetes Maternal Aunt   . Cancer Maternal Aunt   . Cancer  Paternal Grandmother     History   Social History  . Marital Status: Married    Spouse Name: N/A    Number of Children: N/A  . Years of Education: N/A   Occupational History  . Not on file.   Social History Main Topics  . Smoking status: Former Smoker -- 30 years    Quit date: 06/09/2010  . Smokeless tobacco: Not on file  . Alcohol Use: No  . Drug Use:   . Sexually Active: Yes    Birth Control/ Protection: None   Other Topics Concern  . Not on file   Social History Narrative  . No narrative on file    Past Surgical History  Procedure Date  . Breast lumpectomy   . Abdominal hysterectomy         . aspirin  324 mg Oral Once  . enoxaparin  40 mg Subcutaneous Daily  . FLUoxetine  20 mg Oral Once  . FLUoxetine  20 mg Oral Daily  . influenza  inactive virus vaccine  0.5 mL Intramuscular Tomorrow-1000  . lithium  600 mg Oral BID WC  .  morphine injection  4 mg Intravenous Once  .  morphine injection  4 mg Intravenous Once  . ondansetron (ZOFRAN) IV  4 mg Intravenous Once  . risperiDONE  1 mg Oral Once  . risperiDONE  1 mg Oral QHS  . simvastatin  40 mg Oral q1800  . sodium chloride  1,000 mL Intravenous Once  . DISCONTD: FLUoxetine  20 mg Oral Daily  . DISCONTD: FLUoxetine  20 mg Oral Daily  . DISCONTD: lithium carbonate  600 mg Oral Q12H  . DISCONTD: lithium carbonate  600 mg Oral Q12H  . DISCONTD: lithium carbonate  600 mg Oral Once  . DISCONTD: risperiDONE  1 mg Oral BID  . DISCONTD: risperiDONE  1 mg Oral QHS      Physical Exam: Blood pressure 128/81, pulse 80, temperature 97.9 F (36.6 C), temperature source Oral, resp. rate 18, height 5\' 5"  (1.651 m), weight 121.11 kg (267 lb), SpO2 97.00%. General appearance: alert, no distress and Obese black female Neck: no adenopathy, no carotid bruit, no JVD, supple, symmetrical, trachea midline and thyroid not enlarged, symmetric, no tenderness/mass/nodules Resp: clear to auscultation bilaterally Cardio: regular  rate and rhythm, S1, S2 normal, no murmur, click, rub or gallop GI: soft, non-tender; bowel sounds normal; no masses,  no organomegaly Extremities: extremities normal, atraumatic, no cyanosis or edema Pulses: 2+ and symmetric Neurologic: Grossly normal No pain to palpation on chest  Labs:   Lab Results  Component Value Date   WBC 6.3 11/14/2011   HGB 11.2* 11/14/2011   HCT 34.0* 11/14/2011   MCV 86.3 11/14/2011   PLT 193 11/14/2011    Lab 11/14/11 0520 11/07/11 1318  NA 137 --  K 3.8 --  CL 104 --  CO2 24 --  BUN 10 --  CREATININE 0.67 --  CALCIUM 8.1* --  PROT -- 7.2  BILITOT -- 0.5  ALKPHOS -- 71  ALT -- 40*  AST -- 24  GLUCOSE 133* --   Lab Results  Component Value Date   CKTOTAL 143 11/14/2011   CKMB 2.1 11/14/2011   TROPONINI <0.30 11/14/2011    Lab Results  Component Value Date   CHOL 154 11/14/2011   Lab Results  Component Value Date   HDL 46 11/14/2011   Lab Results  Component Value Date   LDLCALC 82 11/14/2011   Lab Results  Component Value Date   TRIG 132 11/14/2011   Lab Results  Component Value Date   CHOLHDL 3.3 11/14/2011   No results found for this basename: LDLDIRECT      Radiology: Dg Chest 2 View  11/13/2011  *RADIOLOGY REPORT*  Clinical Data: Lambert Mody mid chest pain.  History of breast cancer.  CHEST - 2 VIEW  Comparison: Two-view chest 09/02/2010 [facility].  Findings: The heart size is normal.  Mild interstitial coarsening is chronic.  No focal airspace disease is evident.  The visualized soft tissues and bony thorax are unremarkable.  IMPRESSION: Negative chest.  Original Report Authenticated By: Jamesetta Orleans. MATTERN, M.D.   Ct Angio Chest W/cm &/or Wo Cm  11/13/2011  *RADIOLOGY REPORT*  Clinical Data:  Severe chest pain radiating into the trauma, associated with shortness of breath.  Acute onset earlier today.  CT ANGIOGRAPHY CHEST WITH CONTRAST 11/13/2011:  Technique:  Multidetector CT imaging of the chest was performed using the  standard protocol during bolus administration of intravenous contrast.  Multiplanar CT image reconstructions including MIPs were obtained to evaluate the vascular anatomy.  Contrast: OMNIPAQUE IOHEXOL 350 MG/ML IV.  Comparison:  CTA chest 08/02/2010 [facility].  Findings:  Contrast opacification of the pulmonary arteries is good no filling defects within either main pulmonary artery or their branches in either lung to suggest pulmonary embolism.  Heart size upper normal and stable.  No pericardial effusion.  No visible coronary  artery calcification.  No visible atherosclerosis involving the thoracic or upper abdominal aorta or their visualized branches.  Low lung volumes account for atelectasis in the lower lobes.  Lungs otherwise clear without localized airspace consolidation, interstitial disease, or parenchymal nodules or masses.  No pleural effusions.  Scattered normal-sized mediastinal, hilar, and axillary lymph nodes; no significant lymphadenopathy.  Surgical clips in the right breast from prior lumpectomy and in the right axilla from prior node dissection.  Visualized thyroid gland unremarkable.  Calcified granuloma in the liver near the dome, unchanged; no significant abnormalities involving the visualized upper abdomen. Small focus of accessory splenic tissue medial to the lower pole of the spleen.  Bone window images demonstrate mild diffuse thoracic spondylosis.  Review of the MIP images confirms the above findings.  IMPRESSION:  1.  No evidence of pulmonary embolism. 2.  Low lung volumes.  No acute cardiopulmonary disease.  Original Report Authenticated By: Arnell Sieving, M.D.    EKG:  NSR no acute changes   ASSESSMENT AND PLAN:  Chest pain: Atypical  History of asthma.  CT negative for recurrent PE.  Lexiscan myovue today.  She will bring inhaler with her to study Asthma:  Stable with no active wheezing F/U [facility] Bipolar:  Mood and demeanor seem  appropriate continue current medications  Signed: Charlton Haws 11/14/2011, 9:52 AM

## 2011-11-15 ENCOUNTER — Observation Stay (HOSPITAL_COMMUNITY): Payer: BC Managed Care – PPO

## 2011-11-15 ENCOUNTER — Other Ambulatory Visit: Payer: Self-pay

## 2011-11-15 LAB — CARDIAC PANEL(CRET KIN+CKTOT+MB+TROPI)
CK, MB: 1.7 ng/mL (ref 0.3–4.0)
Troponin I: <0.3 ng/mL (ref <0.30)
Troponin I: <0.3 ng/mL (ref <0.30)

## 2011-11-15 MED ORDER — ONDANSETRON HCL 4 MG PO TABS: 4.0000 mg | ORAL_TABLET | Freq: Four times a day (QID) | ORAL | Status: DC | PRN

## 2011-11-15 MED ORDER — OXYCODONE-ACETAMINOPHEN 5-325 MG PO TABS: 1.0000 | ORAL_TABLET | Freq: Three times a day (TID) | ORAL | Status: DC | PRN

## 2011-11-15 MED ORDER — TECHNETIUM TC 99M TETROFOSMIN IV KIT
10.0000 | PACK | Freq: Once | INTRAVENOUS | Status: AC | PRN
  Administered 2011-11-15: 10 via INTRAVENOUS

## 2011-11-15 MED ORDER — TECHNETIUM TC 99M TETROFOSMIN IV KIT
30.0000 | PACK | Freq: Once | INTRAVENOUS | Status: AC | PRN
  Administered 2011-11-14: 30 via INTRAVENOUS

## 2011-11-15 MED ORDER — ASPIRIN EC 81 MG PO TBEC
81.0000 mg | DELAYED_RELEASE_TABLET | Freq: Every day | ORAL | Status: DC
  Administered 2011-11-15: 81 mg via ORAL
  Filled 2011-11-15: qty 1

## 2011-11-15 MED ORDER — ONDANSETRON HCL 4 MG PO TABS: 4.0000 mg | ORAL_TABLET | Freq: Four times a day (QID) | ORAL | Status: AC | PRN

## 2011-11-15 MED ORDER — ASPIRIN 81 MG PO TBEC: 81.0000 mg | DELAYED_RELEASE_TABLET | Freq: Every day | ORAL | Status: DC

## 2011-11-15 MED ORDER — PANTOPRAZOLE SODIUM 40 MG PO TBEC: 40.0000 mg | DELAYED_RELEASE_TABLET | Freq: Every day | ORAL | Status: DC

## 2011-11-15 MED ORDER — PANTOPRAZOLE SODIUM 40 MG PO TBEC
40.0000 mg | DELAYED_RELEASE_TABLET | Freq: Every day | ORAL | Status: DC
  Administered 2011-11-15: 40 mg via ORAL
  Filled 2011-11-15: qty 1

## 2011-11-15 MED ORDER — OXYCODONE-ACETAMINOPHEN 5-325 MG PO TABS: 1.0000 | ORAL_TABLET | Freq: Three times a day (TID) | ORAL | Status: AC | PRN

## 2011-11-15 NOTE — Progress Notes (Signed)
Pt discharged home via wheelchair. IV discontinued and intact. Discharge instructions and patient education complete. Pt. Had no further questions. No s/s of distress.   [name]

## 2011-11-15 NOTE — Discharge Summary (Signed)
PATIENT DETAILS Name: [name] Age: 50 y.o. Sex: female Date of Birth: [birth year] MRN: [id number]. Admit Date: 11/13/2011 Admitting Physician: Sorin Laza WUJ:[id number],WGNFA Dorris Carnes, MD  PRIMARY DISCHARGE DIAGNOSIS:  Principal Problem:  *Chest pain-likely non-cardiac  Active Problems:  HYPERLIPIDEMIA  DYSPNEA  History of pulmonary embolism  Asthma  Bipolar affective disorder      PAST MEDICAL HISTORY: Past Medical History  Diagnosis Date  . Asthma   . Depression   . Bipolar affective disorder, depressed, mild     Remote history of suicidal attempts  . Breast cancer     In 2005 treated with surgery and Arimidex for 5 years  . History of pulmonary embolism     Bilateral in 2001    DISCHARGE MEDICATIONS: Current Discharge Medication List    START taking these medications   Details  aspirin 81 MG EC tablet Take 1 tablet (81 mg total) by mouth daily. Qty: 30 tablet    ondansetron (ZOFRAN) 4 MG tablet Take 1 tablet (4 mg total) by mouth every 6 (six) hours as needed for nausea. Qty: 12 tablet, Refills: 0    oxyCODONE-acetaminophen (PERCOCET) 5-325 MG per tablet Take 1 tablet by mouth every 8 (eight) hours as needed. Qty: 12 tablet, Refills: 0    pantoprazole (PROTONIX) 40 MG tablet Take 1 tablet (40 mg total) by mouth daily at 12 noon. Qty: 30 tablet, Refills: 0      CONTINUE these medications which have NOT CHANGED   Details  albuterol (PROVENTIL) (2.5 MG/3ML) 0.083% nebulizer solution Take 2.5 mg by nebulization every 6 (six) hours as needed. Shortness of breath    atorvastatin (LIPITOR) 20 MG tablet Take 20 mg by mouth daily.      FLUoxetine (PROZAC) 20 MG capsule Take 20 mg by mouth daily.      lithium 600 MG capsule Take 600 mg by mouth 2 (two) times daily with a meal.     risperiDONE (RISPERDAL) 1 MG tablet Take 1 mg by mouth daily.           BRIEF HPI:  See H&P, Labs, Consult and Test reports for all details in brief, patient was admitted for  retro-sternal chest pressure. For further details please see the H&P done on admission.  CONSULTATIONS:   cardiology  PERTINENT RADIOLOGIC STUDIES: Dg Chest 2 View  11/13/2011  *RADIOLOGY REPORT*  Clinical Data: Sharp mid chest pain.  History of breast cancer.  CHEST - 2 VIEW  Comparison: Two-view chest 09/02/2010 [facility].  Findings: The heart size is normal.  Mild interstitial coarsening is chronic.  No focal airspace disease is evident.  The visualized soft tissues and bony thorax are unremarkable.  IMPRESSION: Negative chest.  Original Report Authenticated By: Jamesetta Orleans. MATTERN, M.D.   Ct Angio Chest W/cm &/or Wo Cm  11/13/2011  *RADIOLOGY REPORT*  Clinical Data:  Severe chest pain radiating into the trauma, associated with shortness of breath.  Acute onset earlier today.  CT ANGIOGRAPHY CHEST WITH CONTRAST 11/13/2011:  Technique:  Multidetector CT imaging of the chest was performed using the standard protocol during bolus administration of intravenous contrast.  Multiplanar CT image reconstructions including MIPs were obtained to evaluate the vascular anatomy.  Contrast: OMNIPAQUE IOHEXOL 350 MG/ML IV.  Comparison:  CTA chest 08/02/2010 [facility].  Findings:  Contrast opacification of the pulmonary arteries is good no filling defects within either main pulmonary artery or their branches in either lung to suggest pulmonary embolism.  Heart size upper  normal and stable.  No pericardial effusion.  No visible coronary artery calcification.  No visible atherosclerosis involving the thoracic or upper abdominal aorta or their visualized branches.  Low lung volumes account for atelectasis in the lower lobes.  Lungs otherwise clear without localized airspace consolidation, interstitial disease, or parenchymal nodules or masses.  No pleural effusions.  Scattered normal-sized mediastinal, hilar, and axillary lymph nodes; no significant lymphadenopathy.  Surgical clips in the  right breast from prior lumpectomy and in the right axilla from prior node dissection.  Visualized thyroid gland unremarkable.  Calcified granuloma in the liver near the dome, unchanged; no significant abnormalities involving the visualized upper abdomen. Small focus of accessory splenic tissue medial to the lower pole of the spleen.  Bone window images demonstrate mild diffuse thoracic spondylosis.  Review of the MIP images confirms the above findings.  IMPRESSION:  1.  No evidence of pulmonary embolism. 2.  Low lung volumes.  No acute cardiopulmonary disease.  Original Report Authenticated By: Arnell Sieving, M.D.   Nm Myocar Multi W/spect W/wall Motion / Ef  11/15/2011  *RADIOLOGY REPORT*  Clinical Data:  Chest pain.  Shortness of breath.  MYOCARDIAL IMAGING WITH SPECT (REST AND PHARMACOLOGIC-STRESS) GATED LEFT VENTRICULAR WALL MOTION STUDY LEFT VENTRICULAR EJECTION FRACTION  Technique:  Standard myocardial SPECT imaging was performed after resting intravenous injection of 30 mCi Tc-18m tetrofosmin.  On the following day, intravenous infusion of lexiscan was performed under the supervision of the Cardiology staff.  At peak effect of the drug, 30 mCi Tc-43m tetrofosmin was injected intravenously and standard myocardial SPECT  imaging was performed.  Quantitative gated imaging was also performed to evaluate left ventricular wall motion, and estimate left ventricular ejection fraction.  Comparison:  CTA chest 11/13/2011.  Findings: Normal uptake is present on both the rest and stress images.  There is normal contractility.  Wall motion is normal.  No significant reversibility is evident.  The estimated diastolic volume is 93 ml.  The estimated systolic volume is 36 ml.  The ejection fraction is 62%.  IMPRESSION:  1.  Normal 2-day nuclear medicine myocardial examination with pharmacological stress. 2.  No evidence for reversibility. 3.  Normal contractility and wall motion. 4.  Normal function with an  estimated ejection fraction of 62%.  Original Report Authenticated By: Jamesetta Orleans. MATTERN, M.D.     PERTINENT LAB RESULTS: CBC:  Basename 11/14/11 0520 11/14/11 0230  WBC 6.3 6.4  HGB 11.2* 11.6*  HCT 34.0* 34.8*  PLT 193 202   CMET CMP     Component Value Date/Time   NA 137 11/14/2011 0520   K 3.8 11/14/2011 0520   CL 104 11/14/2011 0520   CO2 24 11/14/2011 0520   GLUCOSE 133* 11/14/2011 0520   BUN 10 11/14/2011 0520   CREATININE 0.67 11/14/2011 0520   CALCIUM 8.1* 11/14/2011 0520   PROT 7.2 11/07/2011 1318   ALBUMIN 4.1 11/07/2011 1318   AST 24 11/07/2011 1318   ALT 40* 11/07/2011 1318   ALKPHOS 71 11/07/2011 1318   BILITOT 0.5 11/07/2011 1318   GFRNONAA >90 11/14/2011 0520   GFRAA >90 11/14/2011 0520    GFR Estimated Creatinine Clearance: 112.1 ml/min (by C-G formula based on Cr of 0.67). No results found for this basename: LIPASE:2,AMYLASE:2 in the last 72 hours  Basename 11/15/11 1633 11/15/11 1220 11/14/11 0239  CKTOTAL 104 97 143  CKMB 1.8 1.7 2.1  CKMBINDEX -- -- --  TROPONINI <0.30 <0.30 <0.30    Basename 11/14/11 0230  POCBNP 86.2   No results found for this basename: DDIMER:2 in the last 72 hours No results found for this basename: HGBA1C:2 in the last 72 hours  Basename 11/14/11 0520  CHOL 154  HDL 46  LDLCALC 82  TRIG 132  CHOLHDL 3.3  LDLDIRECT --    Basename 11/14/11 0230  TSH 2.697  T4TOTAL --  T3FREE --  THYROIDAB --   No results found for this basename: VITAMINB12:2,FOLATE:2,FERRITIN:2,TIBC:2,IRON:2,RETICCTPCT:2 in the last 72 hours Coags: No results found for this basename: PT:2,INR:2 in the last 72 hours Microbiology: No results found for this or any previous visit (from the past 240 hour(s)).   BRIEF HOSPITAL COURSE:   Principal Problem:  *Chest pain -Patient was admitted, she was placed on the telemetry unit. EKG showed NSR, cardiac enzymes were cycled X 5 and these were negative.Ct Angiogram of the chest was negative for  Pul Embolism as well. Because some features of her chest pain were typical, cardiology was consulted, who recommended a Lexiscan.  -Lexiscan was done over a 2 day period, and did come back negative for ischemia/reversibilty. -Patient however did again did complain of some retro-sternal discomfort today,I spoke with Dr Elease Hashimoto, who did think this was non-cardiac and he did not think further work up was necessary. Repeat cardiac enzymes were done around 4:30 pm which was negative, 12 lead EKG was also repeated-which did not show worrisome features. Pain is not as bad as it was when she originally had it at admission. I did have a long detailed discussion, and offered her to stay overnight to continue to monitor and cycle enzymes. However the patient politely refused, and wanted to go home if a repeat set of enzymes done this evening was negative. -It is highly likely that this is non-cardiac in origin, however the patient has been asked to seek immediate medical attention if this pain were to get worse. She claims understanding. All of this was done in presence of patient's RN.   Active Problems:  HYPERLIPIDEMIA -continue with statin   History of pulmonary embolism - as noted above, Ct Angiogram of chest was negative for Pul Embolism   Asthma - continue with her inhalers, lungs were clear on exam   Bipolar affective disorder - continue with her usual meds. Mood was stable throughout this admission. She was awake and alert.   TODAY-DAY OF DISCHARGE:  Subjective:   Aliviya Schoeller today has no headache,no abdominal pain,no new weakness tingling or numbness, feels much better wants to go home today.  Objective:   Blood pressure 165/96, pulse 85, temperature 98.3 F (36.8 C), temperature source Oral, resp. rate 20, height 5\' 5"  (1.651 m), weight 121.11 kg (267 lb), SpO2 99.00%.  Intake/Output Summary (Last 24 hours) at 11/15/11 1825 Last data filed at 11/15/11 1300  Gross per 24 hour    Intake    960 ml  Output      3 ml  Net    957 ml    Exam Awake Alert, Oriented *3, No new F.N deficits, Normal affect [place].AT,PERRAL Supple Neck,No JVD, No cervical lymphadenopathy appriciated.  Symmetrical Chest wall movement, Good air movement bilaterally, CTAB RRR,No Gallops,Rubs or new Murmurs, No Parasternal Heave +ve B.Sounds, Abd Soft, Non tender, No organomegaly appriciated, No rebound -guarding or rigidity. No Cyanosis, Clubbing or edema, No new Rash or bruise  DISPOSITION: Home  DISCHARGE INSTRUCTIONS:    Follow-up Information    Follow up with SANDERS,ROBYN N. Make an appointment in 1 week.  Contact information:   [address] [place] [address] [phone]          Total Time spent on discharge equals 45 minutes.  SignedJeoffrey Massed 11/15/2011 6:25 PM

## 2011-11-15 NOTE — Progress Notes (Signed)
Subjective:    [name] is a 50 year old female with a history of hyperlipidemia, pulmonary embolus obesity, and asthma. She is admitted with chest pain. One set of cardiac markers is negative. She is scheduled for a 2 day Myoview study. She's going for the second set of images today. She's not had any further episodes of chest pain.      Marland Kitchen enoxaparin  40 mg Subcutaneous Daily  . FLUoxetine  20 mg Oral Daily  . influenza  inactive virus vaccine  0.5 mL Intramuscular Tomorrow-1000  . lithium  600 mg Oral BID WC  . regadenoson  0.4 mg Intravenous Once  . risperiDONE  1 mg Oral QHS  . simvastatin  40 mg Oral q1800      Objective:  Vital Signs in the last 24 hours: Blood pressure 110/65, pulse 86, temperature 98.1 F (36.7 C), temperature source Oral, resp. rate 20, height 5\' 5"  (1.651 m), weight 267 lb (121.11 kg), SpO2 95.00%. Temp:  [97.7 F (36.5 C)-98.2 F (36.8 C)] 98.1 F (36.7 C) (12/06 0408) Pulse Rate:  [75-106] 86  (12/06 0408) Resp:  [19-20] 20  (12/06 0408) BP: (83-131)/(38-82) 110/65 mmHg (12/06 0408) SpO2:  [93 %-97 %] 95 % (12/06 0408)  Intake/Output from previous day: 12/05 0701 - 12/06 0700 In: 960 [P.O.:960] Out: 3 [Urine:3] Intake/Output from this shift:    Physical Exam: The patient is alert and oriented x 3.  The mood and affect are normal.   Skin: warm and dry.  Color is normal.    HEENT:   the sclera are nonicteric.  The mucous membranes are moist.  The carotids are 2+ without bruits.  There is no thyromegaly.  There is no JVD.    Lungs: clear.  The chest wall is non tender.    Heart: regular rate with a normal S1 and S2.  There are no murmurs, gallops, or rubs. The PMI is not displaced.     Abdomen: good bowel sounds.  There is no guarding or rebound.  There is no hepatosplenomegaly or tenderness.  There are no masses.   Extremities:  no clubbing, cyanosis, or edema.  The legs are without rashes.  The distal pulses are intact.   Neuro:   Cranial nerves II - XII are intact.  Motor and sensory functions are intact.     Lab Results:  Basename 11/14/11 0520 11/14/11 0230  WBC 6.3 6.4  HGB 11.2* 11.6*  PLT 193 202    Basename 11/14/11 0520 11/13/11 1327  NA 137 140  K 3.8 3.7  CL 104 106  CO2 24 24  GLUCOSE 133* 84  BUN 10 9  CREATININE 0.67 0.72    Basename 11/14/11 0239 11/13/11 1622  TROPONINI <0.30 <0.30   No results found for this basename: BNP in the last 72 hours Hepatic Function Panel No results found for this basename: PROT,ALBUMIN,AST,ALT,ALKPHOS,BILITOT,BILIDIR,IBILI in the last 72 hours Lab Results  Component Value Date   CHOL 154 11/14/2011   HDL 46 11/14/2011   LDLCALC 82 11/14/2011   TRIG 132 11/14/2011   CHOLHDL 3.3 11/14/2011   No results found for this basename: INR in the last 72 hours    Assessment/Plan:   1. Chest pain her initial set of cardiac enzymes are negative. She is in the middle of a 2 day Myoview study. If the Myoview images are negative, she'll be about to be discharged to home today. We'll get another set of cardiac enzymes today.   she  remains very stable. I will allow her to shower today.  Disposition: We'll discharge to home today if her Myoview study is normal.  Vesta Mixer, Montez Hageman., MD, [facility] 11/15/2011, 8:06 AM LOS: Day 2

## 2011-11-15 NOTE — Progress Notes (Signed)
Lexiscan negative. However patient complaining of chest pressure(started around 9 am today).  Not in the scale that she came in with. 6/10 in intensity. Not radiating. Cardiac Enzymes X 4 negative. CT Angio negative for Pul Embolism as well.Doubt that this is cardiac at this point.  Spoke and discussed  with Dr Elease Hashimoto over the phone, he did not think further cardiac workup was needed.He felt that this was non cardiac as well, he however suggested to repeat another set of enzymes, since it has been almost 8 hours since pain started-I will order another set of enzymes. I had a long discusssion with patient, explained that the likelihood of this pain being cardiac was very minimal, offered to keep her overnight to further cycle cardiac enzymes. After much discussion, she wanted to go home this evening if the repeat cardiac enzymes were negative. Will start PPI also and follow up later this evening.

## 2011-11-15 NOTE — Progress Notes (Signed)
Utilization review completed.  

## 2011-11-16 NOTE — Progress Notes (Signed)
   CARE MANAGEMENT NOTE 11/16/2011  Patient:  [name], [name]   Account Number:  [id number]  Date Initiated:  11/14/2011  Documentation initiated by:  Donn Pierini  Subjective/Objective Assessment:   Pt admitted with chest pain     Action/Plan:   PTA pt lived at home with spouse, was independent with ADLs   Anticipated DC Date:  11/15/2011   Anticipated DC Plan:  HOME/SELF CARE      DC Planning Services  CM consult      Choice offered to / List presented to:             Status of service:  Completed, signed off Medicare Important Message given?   (If response is "NO", the following Medicare IM given date fields will be blank) Date Medicare IM given:   Date Additional Medicare IM given:    Discharge Disposition:  HOME/SELF CARE  Per UR Regulation:  Reviewed for med. necessity/level of care/duration of stay  Comments:  PCP- Dorothyann Peng  11/15/11- 1500- Donn Pierini RN, BSN [phone] Pt doing 2-day stress test- to finish study today if normal will d/c later today  11/14/11- 1500- Donn Pierini RN, BSN [phone] Pt for stress test today, if negative possible discharge later today. No anticipated discharge needs.

## 2011-11-17 NOTE — ED Provider Notes (Signed)
I saw and evaluated the patient, reviewed the resident's note and I agree with the findings and plan. I evaluated the EKG and agree with reading.  Gwyneth Sprout, MD 11/17/11 [phone]

## 2011-11-18 ENCOUNTER — Emergency Department (INDEPENDENT_AMBULATORY_CARE_PROVIDER_SITE_OTHER): Payer: BC Managed Care – PPO

## 2011-11-18 ENCOUNTER — Emergency Department (INDEPENDENT_AMBULATORY_CARE_PROVIDER_SITE_OTHER)
Admission: EM | Admit: 2011-11-18 | Discharge: 2011-11-18 | Disposition: A | Discharge status: Home / Self Care | Payer: Self-pay | Source: Home / Self Care

## 2011-11-18 ENCOUNTER — Encounter (HOSPITAL_COMMUNITY): Payer: Self-pay | Admitting: *Deleted

## 2011-11-18 DIAGNOSIS — T148XXA Other injury of unspecified body region, initial encounter: Secondary | ICD-10-CM

## 2011-11-18 MED ORDER — IBUPROFEN 800 MG PO TABS
800.0000 mg | ORAL_TABLET | Freq: Once | ORAL | Status: AC
  Administered 2011-11-18: 800 mg via ORAL

## 2011-11-18 MED ORDER — CYCLOBENZAPRINE HCL 10 MG PO TABS: 5.0000 mg | ORAL_TABLET | Freq: Three times a day (TID) | ORAL | Status: AC | PRN

## 2011-11-18 MED ORDER — IBUPROFEN 800 MG PO TABS: 800.0000 mg | ORAL_TABLET | Freq: Three times a day (TID) | ORAL | Status: AC

## 2011-11-18 MED ORDER — IBUPROFEN 800 MG PO TABS
ORAL_TABLET | ORAL | Status: AC
  Filled 2011-11-18: qty 1
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
## 2011-11-19 NOTE — ED Provider Notes (Signed)
Medical screening examination/treatment/procedure(s) were performed by non-physician practitioner and as supervising physician I was immediately available for consultation/collaboration.  Jonique Kulig   Latria Mccarron, MD 11/19/11 1126 

## 2011-12-09 ENCOUNTER — Emergency Department (HOSPITAL_COMMUNITY)
Admission: EM | Admit: 2011-12-09 | Discharge: 2011-12-09 | Disposition: A | Discharge status: Home / Self Care | Payer: BC Managed Care – PPO | Attending: Emergency Medicine | Admitting: Emergency Medicine

## 2011-12-09 ENCOUNTER — Encounter (HOSPITAL_COMMUNITY): Payer: Self-pay | Admitting: *Deleted

## 2011-12-09 DIAGNOSIS — R059 Cough, unspecified: Secondary | ICD-10-CM | POA: Insufficient documentation

## 2011-12-09 DIAGNOSIS — R5381 Other malaise: Secondary | ICD-10-CM | POA: Insufficient documentation

## 2011-12-09 DIAGNOSIS — J45909 Unspecified asthma, uncomplicated: Secondary | ICD-10-CM | POA: Insufficient documentation

## 2011-12-09 DIAGNOSIS — R5383 Other fatigue: Secondary | ICD-10-CM | POA: Insufficient documentation

## 2011-12-09 DIAGNOSIS — J069 Acute upper respiratory infection, unspecified: Secondary | ICD-10-CM | POA: Insufficient documentation

## 2011-12-09 DIAGNOSIS — R070 Pain in throat: Secondary | ICD-10-CM | POA: Insufficient documentation

## 2011-12-09 DIAGNOSIS — IMO0001 Reserved for inherently not codable concepts without codable children: Secondary | ICD-10-CM | POA: Insufficient documentation

## 2011-12-09 DIAGNOSIS — Z7982 Long term (current) use of aspirin: Secondary | ICD-10-CM | POA: Insufficient documentation

## 2011-12-09 DIAGNOSIS — Z86711 Personal history of pulmonary embolism: Secondary | ICD-10-CM | POA: Insufficient documentation

## 2011-12-09 DIAGNOSIS — M549 Dorsalgia, unspecified: Secondary | ICD-10-CM | POA: Insufficient documentation

## 2011-12-09 DIAGNOSIS — F3130 Bipolar disorder, current episode depressed, mild or moderate severity, unspecified: Secondary | ICD-10-CM | POA: Insufficient documentation

## 2011-12-09 DIAGNOSIS — R079 Chest pain, unspecified: Secondary | ICD-10-CM | POA: Insufficient documentation

## 2011-12-09 DIAGNOSIS — R05 Cough: Secondary | ICD-10-CM | POA: Insufficient documentation

## 2011-12-09 DIAGNOSIS — Z853 Personal history of malignant neoplasm of breast: Secondary | ICD-10-CM | POA: Insufficient documentation

## 2011-12-09 DIAGNOSIS — J3489 Other specified disorders of nose and nasal sinuses: Secondary | ICD-10-CM | POA: Insufficient documentation

## 2011-12-09 DIAGNOSIS — R6883 Chills (without fever): Secondary | ICD-10-CM | POA: Insufficient documentation

## 2011-12-09 DIAGNOSIS — Z79899 Other long term (current) drug therapy: Secondary | ICD-10-CM | POA: Insufficient documentation

## 2011-12-09 MED ORDER — FLUTICASONE PROPIONATE 50 MCG/ACT NA SUSP: 2.0000 | Freq: Every day | NASAL | Status: DC

## 2011-12-09 MED ORDER — TRAMADOL HCL 50 MG PO TABS: 50.0000 mg | ORAL_TABLET | Freq: Four times a day (QID) | ORAL | Status: AC | PRN

## 2011-12-09 MED ORDER — PREDNISONE (PAK) 10 MG PO TABS: ORAL_TABLET | ORAL | Status: AC

## 2011-12-09 NOTE — ED Provider Notes (Signed)
History     CSN: [id number]  Arrival date & time 12/09/11  1228   First MD Initiated Contact with Patient 12/09/11 1347     1:54 PM HPI  Patient is a 50 y.o. female presenting with flu symptoms. The history is provided by the patient.  Influenza This is a new problem. The current episode started in the past 7 days. The problem occurs constantly. The problem has been gradually worsening. Associated symptoms include chest pain, chills, congestion, coughing, headaches, myalgias, a sore throat and weakness. Pertinent negatives include no abdominal pain, fever, nausea, neck pain, numbness, urinary symptoms or vomiting. Associated symptoms comments: Bodyaches. The symptoms are aggravated by nothing. Treatments tried: tamiflu and ibuprofen. The treatment provided no relief.  H/o Asthma . Patient statesher husband had some leftover Tamiflu. Reports she took all this without any relief. Continues to have significant nonproductive cough that keeps her awake at night. Reports chest discomfort with coughing. Reports no relief with over-the-counter medications.  Past Medical History  Diagnosis Date  . Asthma   . Depression   . Bipolar affective disorder, depressed, mild     Remote history of suicidal attempts  . Breast cancer     In 2005 treated with surgery and Arimidex for 5 years  . History of pulmonary embolism     Bilateral in 2001    Past Surgical History  Procedure Date  . Breast lumpectomy   . Abdominal hysterectomy     Family History  Problem Relation Age of Onset  . Cancer Father   . Diabetes Maternal Aunt   . Cancer Maternal Aunt   . Cancer Paternal Grandmother     History  Substance Use Topics  . Smoking status: Former Smoker -- 30 years    Quit date: 06/09/2010  . Smokeless tobacco: Not on file  . Alcohol Use: No    OB History    Grav Para Term Preterm Abortions TAB SAB Ect Mult Living                  Review of Systems  Constitutional: Positive for chills.  Negative for fever.  HENT: Positive for congestion and sore throat. Negative for ear pain, rhinorrhea and neck pain.   Respiratory: Positive for cough. Negative for shortness of breath.   Cardiovascular: Positive for chest pain.  Gastrointestinal: Negative for nausea, vomiting, abdominal pain and diarrhea.  Musculoskeletal: Positive for myalgias and back pain.  Neurological: Positive for weakness and headaches. Negative for numbness.  All other systems reviewed and are negative.    Allergies  Gabapentin; Codeine; Lamotrigine; and Topiramate  Home Medications   Current Outpatient Rx  Name Route Sig Dispense Refill  . ALBUTEROL SULFATE (2.5 MG/3ML) 0.083% IN NEBU Nebulization Take 2.5 mg by nebulization every 6 (six) hours as needed. Shortness of breath    . ASPIRIN 81 MG PO TBEC Oral Take 1 tablet (81 mg total) by mouth daily. 30 tablet   . ATORVASTATIN CALCIUM 20 MG PO TABS Oral Take 20 mg by mouth daily.      Marland Kitchen FLUOXETINE HCL 20 MG PO CAPS Oral Take 20 mg by mouth daily.      Marland Kitchen LITHIUM CARBONATE 600 MG PO CAPS Oral Take 600 mg by mouth 2 (two) times daily with a meal.     . PANTOPRAZOLE SODIUM 40 MG PO TBEC Oral Take 1 tablet (40 mg total) by mouth daily at 12 noon. 30 tablet 0  . RISPERIDONE 1 MG PO TABS Oral Take  1 mg by mouth daily.        BP 125/88  Pulse 89  Temp(Src) 99.1 F (37.3 C) (Oral)  Resp 21  SpO2 97%  Physical Exam  Vitals reviewed. Constitutional: She is oriented to person, place, and time. Vital signs are normal. She appears well-developed and well-nourished. No distress.  HENT:  Head: Normocephalic and atraumatic.  Right Ear: External ear normal.  Left Ear: External ear normal.  Nose: Nose normal.  Mouth/Throat: Oropharynx is clear and moist. No oropharyngeal exudate.  Eyes: Conjunctivae are normal. Pupils are equal, round, and reactive to light.  Neck: Trachea normal and normal range of motion. Neck supple. No erythema present.  Cardiovascular: Normal  rate, regular rhythm and normal heart sounds.  Exam reveals no friction rub.   No murmur heard. Pulmonary/Chest: Effort normal and breath sounds normal. She has no wheezes. She has no rhonchi. She has no rales. She exhibits no tenderness.  Musculoskeletal: Normal range of motion.  Neurological: She is alert and oriented to person, place, and time. Coordination normal.  Skin: Skin is warm and dry. No rash noted. No erythema. No pallor.    ED Course  Procedures   MDM   Discussed the patient. We'll treat for upper respiratory tract infection. Patient agrees to plan and is ready for discharge       Thomasene Lot, [place] 12/09/11 1422

## 2011-12-09 NOTE — ED Notes (Signed)
Pt reports flu like symptoms, cough, sore throat, congestion, chest discomfort worse with cough since Thursday. Has taken OTC meds without relief

## 2011-12-09 NOTE — ED Notes (Signed)
C/o body aches, sore throat and pain with inspiration since Thursday evening.

## 2011-12-12 NOTE — ED Provider Notes (Signed)
Medical screening examination/treatment/procedure(s) were performed by non-physician practitioner and as supervising physician I was immediately available for consultation/collaboration.   Suzi Roots, MD 12/12/11 [phone]

## 2011-12-13 ENCOUNTER — Ambulatory Visit: Payer: BC Managed Care – PPO

## 2011-12-13 ENCOUNTER — Ambulatory Visit (HOSPITAL_BASED_OUTPATIENT_CLINIC_OR_DEPARTMENT_OTHER): Payer: BC Managed Care – PPO | Admitting: Family

## 2011-12-13 VITALS — BP 134/85 | HR 96 | Temp 97.6°F | Ht 65.0 in | Wt 265.2 lb

## 2011-12-13 DIAGNOSIS — N949 Unspecified condition associated with female genital organs and menstrual cycle: Secondary | ICD-10-CM

## 2011-12-13 DIAGNOSIS — Z853 Personal history of malignant neoplasm of breast: Secondary | ICD-10-CM

## 2011-12-13 DIAGNOSIS — N951 Menopausal and female climacteric states: Secondary | ICD-10-CM

## 2011-12-13 DIAGNOSIS — F319 Bipolar disorder, unspecified: Secondary | ICD-10-CM

## 2011-12-13 MED ORDER — ESTRADIOL 10 MCG VA TABS: ORAL_TABLET | VAGINAL | Status: DC

## 2011-12-14 ENCOUNTER — Encounter: Payer: Self-pay | Admitting: Family

## 2011-12-14 NOTE — Progress Notes (Signed)
[facility] SURVIVOR CLINIC EVALUATION  Name: [name]                  DATE: 12/14/2011 MRN: [id number]                      DOB: 04-02-1961  CC: Gwynneth Aliment, MD           REFERRING PHYSICIAN: Gwynneth Aliment, MD  REASON FOR VISIT: Establish care in the Breast Cancer Survivor Clinic.   DIAGNOSIS:  Encounter Diagnoses  Name Primary?  . Menopausal vaginal dryness   . 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 Treated at the [facility] with lumpectomy, radiation therapy and 5 years of Arimidex which she completed in 2010. Underwent bilateral oophorectomy at the time of lumpectomy.   Has been free of disease since completion of treatment. Quit smoking 1 1/2 years ago, has gained 30 lbs since quitting. Remains on bipolar medications, takes them faithfully. Is currently enrolled in college, pursuing a degree in Criminal Justice and wants to attend law school subsequently.    PAST MEDICAL HISTORY:  Past Medical History  Diagnosis Date  . Asthma   . Depression   . Bipolar affective disorder, depressed, mild     Remote history of suicidal attempts  . Breast cancer     In 2005 treated with surgery and Arimidex for 5 years  . History of pulmonary embolism     Bilateral in 2001     ALLERGIES:  Allergen Reaction Noted  . Gabapentin Swelling 11/07/2011  . Codeine  08/22/2010  . Lamotrigine  08/22/2010  . Topiramate  12/19/2010     MEDICATIONS:  Current Outpatient Prescriptions  Medication Sig Dispense Refill  . albuterol (PROVENTIL HFA;VENTOLIN HFA) 108 (90 BASE) MCG/ACT inhaler Inhale 2 puffs into the lungs every 6 (six) hours as needed. sob       . aspirin 81 MG EC tablet Take 1 tablet (81 mg total) by mouth daily.  30 tablet    . atorvastatin (LIPITOR) 20 MG tablet Take 20 mg by mouth daily.        . Estradiol  (VAGIFEM) 10 MCG TABS Insert 1 tab vaginally nightly at hs for 1 week, then twice weekly.  14 tablet  11  . FLUoxetine (PROZAC) 20 MG capsule Take 20 mg by mouth daily.        . fluticasone (FLONASE) 50 MCG/ACT nasal spray Place 2 sprays into the nose daily.  16 g  2  . lithium 600 MG capsule Take 600 mg by mouth 2 (two) times daily with a meal.       . pantoprazole (PROTONIX) 40 MG tablet Take 1 tablet (40 mg total) by mouth daily at 12 noon.  30 tablet  0  . predniSONE (STERAPRED UNI-PAK) 10 MG tablet Take 6 Tabs PO on day 1. Take 5 tabs PO on day 2. Take 4 tabs PO on day 3. Take 3 tabs PO on day 4. Take 2 tabs PO on day 5. Take 1 tab PO on day 6.   21 tablet  0  . risperiDONE (RISPERDAL) 1 MG tablet Take 1 mg by mouth daily.        . traMADol (ULTRAM) 50 MG tablet Take 1 tablet (50 mg total) by mouth every 6 (six) hours as needed for pain. Maximum dose=  8 tablets per day  30 tablet  0     SOCIAL HISTORY:   . Marital Status: Married  . Years of Education: 15   Occupational History  . Student.   Social History Main Topics  . Smoking status: Former Smoker -- 30 years    Quit date: 06/09/2010  . Alcohol Use: No  . Sexually Active: Yes   FAMILY HISTORY:  Family History  Problem Relation Age of Onset  . Cancer Father   . Diabetes Maternal Aunt   . Cancer Maternal Aunt   . Cancer Paternal Grandmother     SELF-ASSESSMENT CONCERNS: Physical: Weight gain, lack of exercise, vaginal dryness.  Spiritual/Religious: None.  Practical: None Family/Relationship: Vaginal dryness causes dyspareunia which hinders marital intimacy.  Emotional: None Lifestyle or Information Needs: Diet and nutrition.    REVIEW OF SYSTEMS: General: Negative for fever, chills, night sweats,  loss of appetite or weight loss. Reports 30 lb weight gain over last 2 years.  HEENT: Negative for headaches, sore  throat, difficulty swallowing, blurred vision or problem with hearing or  sinus congestion.  Respiratory: Negative for shortness of breath, cough  or dyspnea on exertion. Cardiovascular: Negative for chest pain,  palpitations or pedal edema. GI: Negative for nausea, vomiting,  diarrhea, constipation, change in bowel habits or blood in the stool.  No jaundice. GU: Negative for painful or frequent urination, change in  color of urine, or decreased urinary stream. Integumentary: Negative  for skin rashes or other suspicious skin lesions. Hematologic: Negative  for easy bruisability or bleeding. Musculoskeletal: Negative for  complaints of pain, arthralgias, arthritis or myalgias.  Neurological/psychiatric: Negative for numbness, focal weakness,  balance problems or coordination difficulties. Occasional manic/depressive episodes. Breast: No self-detected breast complaints. Gets yearly mammogram.    PHYSICAL EXAM: BP 134/85  Pulse 96  Temp(Src) 97.6 F (36.4 C) (Oral)  Ht 5\' 5"  (1.651 m)  Wt 265 lb 3.2 oz (120.294 kg)  BMI 44.13 kg/m2 GENERAL: Well developed, well nourished, in no acute distress.  EENT: No ocular or oral lesions. No stomatitis.  RESPIRATORY: Lungs are clear to auscultation bilaterally with normal respiratory movement and no accessory muscle use. CARDIAC: No murmur, rub or tachycardia. No upper or lower extremity edema.  GI: Abdomen is soft, no palpable hepatosplenomegaly. No fluid wave. No tenderness. Musculoskeletal: No kyphosis, no tenderness over the spine, ribs or hips. Lymph: No cervical, infraclavicular, axillary or inguinal adenopathy. Breast: In the supine position, with the right arm over the head, the right nipple is absent owing to central lumpectomy. Right breast is smaller in volume than left. No mass in any quadrant or subareolar region. No redness of the skin. No right axillary adenopathy. With the left arm over the head, left nipple is everted. No periareolar edema or nipple discharge. No mass in any quadrant or subareolar region. No redness of the  skin. No left axillary adenopathy. Neuro: No focal neurological deficits. Psych: Alert and oriented X 3, appropriate mood and affect.    LABORATORY STUDIES:  Results for orders placed during the hospital encounter of 11/13/11  BASIC METABOLIC PANEL      Component Value Range   Sodium 140  135 - 145 (mEq/L)   Potassium 3.7  3.5 - 5.1 (mEq/L)   Chloride 106  96 - 112 (mEq/L)   CO2 24  19 - 32 (mEq/L)   Glucose, Bld 84  70 - 99 (mg/dL)   BUN 9  6 - 23 (mg/dL)   Creatinine, Ser 1.19  0.50 - 1.10 (mg/dL)   Calcium 9.0  8.4 - 04.5 (mg/dL)   GFR calc non Af Amer >90  >90 (mL/min)   GFR calc Af Amer >90  >90 (mL/min)  CBC      Component Value Range   WBC 5.4  4.0 - 10.5 (K/uL)   RBC 4.51  3.87 - 5.11 (MIL/uL)   Hemoglobin 12.9  12.0 - 15.0 (g/dL)   HCT 40.9  81.1 - 91.4 (%)   MCV 85.4  78.0 - 100.0 (fL)   MCH 28.6  26.0 - 34.0 (pg)   MCHC 33.5  30.0 - 36.0 (g/dL)   RDW 78.2  95.6 - 21.3 (%)   Platelets 217  150 - 400 (K/uL)  TROPONIN I      Component Value Range   Troponin I <0.30  <0.30 (ng/mL)  LITHIUM LEVEL      Component Value Range   Lithium Lvl 0.27 (*) 0.80 - 1.40 (mEq/L)  TROPONIN I      Component Value Range   Troponin I <0.30  <0.30 (ng/mL)  CBC      Component Value Range   WBC 6.4  4.0 - 10.5 (K/uL)   RBC 4.07  3.87 - 5.11 (MIL/uL)   Hemoglobin 11.6 (*) 12.0 - 15.0 (g/dL)   HCT 08.6 (*) 57.8 - 46.0 (%)   MCV 85.5  78.0 - 100.0 (fL)   MCH 28.5  26.0 - 34.0 (pg)   MCHC 33.3  30.0 - 36.0 (g/dL)   RDW 46.9  62.9 - 52.8 (%)   Platelets 202  150 - 400 (K/uL)  CBC      Component Value Range   WBC 6.3  4.0 - 10.5 (K/uL)   RBC 3.94  3.87 - 5.11 (MIL/uL)   Hemoglobin 11.2 (*) 12.0 - 15.0 (g/dL)   HCT 41.3 (*) 24.4 - 46.0 (%)   MCV 86.3  78.0 - 100.0 (fL)   MCH 28.4  26.0 - 34.0 (pg)   MCHC 32.9  30.0 - 36.0 (g/dL)   RDW 01.0  27.2 - 53.6 (%)   Platelets 193  150 - 400 (K/uL)  BASIC METABOLIC PANEL      Component Value Range   Sodium 137  135 - 145 (mEq/L)    Potassium 3.8  3.5 - 5.1 (mEq/L)   Chloride 104  96 - 112 (mEq/L)   CO2 24  19 - 32 (mEq/L)   Glucose, Bld 133 (*) 70 - 99 (mg/dL)   BUN 10  6 - 23 (mg/dL)   Creatinine, Ser 6.44  0.50 - 1.10 (mg/dL)   Calcium 8.1 (*) 8.4 - 10.5 (mg/dL)   GFR calc non Af Amer >90  >90 (mL/min)   GFR calc Af Amer >90  >90 (mL/min)  CARDIAC PANEL(CRET KIN+CKTOT+MB+TROPI)      Component Value Range   Total CK 143  7 - 177 (U/L)   CK, MB 2.1  0.3 - 4.0 (ng/mL)   Troponin I <0.30  <0.30 (ng/mL)   Relative Index 1.5  0.0 - 2.5   PRO B NATRIURETIC PEPTIDE      Component Value Range   Pro B Natriuretic peptide (BNP) 86.2  0 - 125 (pg/mL)  TSH      Component Value Range   TSH 2.697  0.350 - 4.500 (uIU/mL)  LIPID PANEL      Component Value Range   Cholesterol 154  0 - 200 (mg/dL)   Triglycerides 034  <742 (mg/dL)   HDL 46  >  39 (mg/dL)   Total CHOL/HDL Ratio 3.3     VLDL 26  0 - 40 (mg/dL)   LDL Cholesterol 82  0 - 99 (mg/dL)  CARDIAC PANEL(CRET KIN+CKTOT+MB+TROPI)      Component Value Range   Total CK 97  7 - 177 (U/L)   CK, MB 1.7  0.3 - 4.0 (ng/mL)   Troponin I <0.30  <0.30 (ng/mL)   Relative Index RELATIVE INDEX IS INVALID  0.0 - 2.5   CARDIAC PANEL(CRET KIN+CKTOT+MB+TROPI)      Component Value Range   Total CK 104  7 - 177 (U/L)   CK, MB 1.8  0.3 - 4.0 (ng/mL)   Troponin I <0.30  <0.30 (ng/mL)   Relative Index 1.7  0.0 - 2.5      TEACHING: I addressed the need to screen for other cancers (skin, colon). We reviewed the need for self breast exam and instruction was given. I reiterated symptoms to report and when to call the clinic. Risk reduction strategies per NCCN guidelines were reviewed.   REFERRALS: 1. Dietary/Nutrition consult. 2. Plastic surgery for consultation for symmetry and nipple reconstruction.  IMPRESSION: 51 year old [place] American female with: 1. History of right infiltrating ductal carcinoma diagnosed in 2004, post right central lumpectomy with resulting asymmetry and  removal of the nipple with no reconstruction. No evidence of disease. 2. Bipolar disorder, on medications with good control. 3. 30 pound weight gain over the last 1-1/2 years. 4. Vaginal dryness, owing to bilateral oophorectomy.    PLAN: 1. Referrals as above. 2. Prescription for Vagifem 10 mcg vaginally twice weekly. 3. Following NCCN guidelines she will get yearly mammograms and we will follow her in the survivor clinic yearly. I urge her to call the office sooner with any self detected breast problems or concerns.  DISCUSSION: We discussed signs and symptoms of breast cancer recurrence or metastatic disease to report to the clinic. A total of 60 minutes was spent with the patient. More than 45 minutes were spent on counseling regarding cancer survivor issues and coordination of follow-up care. NCCN guidelines for follow-up care were reviewed and a surveillance plan was outlined. The importance of compliance was stressed.

## 2011-12-15 ENCOUNTER — Telehealth: Payer: Self-pay | Admitting: Oncology

## 2011-12-15 NOTE — Telephone Encounter (Signed)
Gv pt appt for jan-jan2014.  called Dr. Earlie Server office scheduled a appt for 01/01/2012

## 2011-12-17 ENCOUNTER — Ambulatory Visit: Payer: BC Managed Care – PPO | Admitting: Nutrition

## 2011-12-17 NOTE — Progress Notes (Signed)
Initial Out Patient Nutrition   Patient Reports: Patient desired counseling for weight loss goals after treatment, 7 yr survivor.  Patient stated she would like to loose 100 pounds. Patient is currently a full time student. Typical day is class and study. Reported to eat three meals a day. Breakfast is usually oatmeal, coffee and juice. Next meal is around 3:00pm after class, a sandwich or leftovers. Next meal is dinner, usually chicken, a starch and a vegetable. Patient stated that the lithium makes her very thirsty and she drinks a lot of orange juice. She also stated that she really likes cheese and condiments. Patient desires to make healthy life style changes for weight loss and overall health.   Past Medical History:  Past Medical History  Diagnosis Date  . Asthma   . Depression   . Bipolar affective disorder, depressed, mild     Remote history of suicidal attempts  . Breast cancer     In 2005 treated with surgery and Arimidex for 5 years  . History of pulmonary embolism     Bilateral in 2001    Meds: Scheduled Meds:  Lipitor, prozac, lithium, risperdal  Labs:  CMP     Component Value Date/Time   NA 137 11/14/2011 0520   K 3.8 11/14/2011 0520   CL 104 11/14/2011 0520   CO2 24 11/14/2011 0520   GLUCOSE 133* 11/14/2011 0520   BUN 10 11/14/2011 0520   CREATININE 0.67 11/14/2011 0520   CALCIUM 8.1* 11/14/2011 0520   PROT 7.2 11/07/2011 1318   ALBUMIN 4.1 11/07/2011 1318   AST 24 11/07/2011 1318   ALT 40* 11/07/2011 1318   ALKPHOS 71 11/07/2011 1318   BILITOT 0.5 11/07/2011 1318   GFRNONAA >90 11/14/2011 0520   GFRAA >90 11/14/2011 0520    Weight Status:  265.2 lb.  Height: 65" BMI: 44.13  Nutrition Dx:  Food and nutrition related knowledge deficit related to healthy eating and weight loss as evidence by need for weight loss counseling and BMI of 44.13  Goal:  Discussed realistic goals with patient, set three goals: 1. Will go to the gym at least 2 times a week 2. Will work on  decreasing caloric beverages 3. Will keep track of calorie intake for 2 to three days to determine daily intake and where most of calories are coming from  Intervention:  Educated patient on healthful eating habits/ healthy food choices. Provided healthy nutrition handouts. Discussed ways to decrease calories from beverages and provided healthy snack ideas.   Monitor/ follow up: No follow-up scheduled. Nutrition related knowledge deficit resolved with counseling. Instructed patient to contact out patient RD if questions arise or to schedule additional visits. If follow-up is scheduled will monitor weight loss, progress and ability to achieve set goals.  Iven Finn, MS, RD, LDN Pager #:  [phone]

## 2012-01-10 ENCOUNTER — Ambulatory Visit
Admission: RE | Admit: 2012-01-10 | Discharge: 2012-01-10 | Disposition: A | Discharge status: Home / Self Care | Payer: BC Managed Care – PPO | Source: Ambulatory Visit | Attending: Family Medicine | Admitting: Family Medicine

## 2012-01-10 ENCOUNTER — Other Ambulatory Visit: Payer: BC Managed Care – PPO

## 2012-01-10 ENCOUNTER — Other Ambulatory Visit: Payer: Self-pay | Admitting: Family Medicine

## 2012-01-10 DIAGNOSIS — R609 Edema, unspecified: Secondary | ICD-10-CM

## 2012-01-29 ENCOUNTER — Ambulatory Visit: Payer: BC Managed Care – PPO | Admitting: Internal Medicine

## 2012-01-29 ENCOUNTER — Encounter: Payer: Self-pay | Admitting: Internal Medicine

## 2012-01-29 ENCOUNTER — Ambulatory Visit (INDEPENDENT_AMBULATORY_CARE_PROVIDER_SITE_OTHER): Payer: BC Managed Care – PPO | Admitting: Internal Medicine

## 2012-01-29 DIAGNOSIS — J45909 Unspecified asthma, uncomplicated: Secondary | ICD-10-CM

## 2012-01-29 NOTE — Patient Instructions (Signed)
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

## 2012-01-29 NOTE — Progress Notes (Signed)
Subjective:    Patient ID: [name], female    DOB: 09/12/1961, 51 y.o.   MRN: [id number]  HPI # Breast cancer in 2005   - Treated at [facility]. Dr Parsons.  Under remission per hx No oncologist in [place].  # Bilateral pulmonary embolisms in 2001    - "both lungs". Diagnosed at [facility]. Treatd in medical floor with heparin and coumadin. Per hx no oxygenation, mental status or hemodynamic issues at time of dx  - s/p coumadin Rx for 1 year. # She has had a history of 12 prior suicide attempts #Bipolar disorder #Hyperlipidemia #Obesity  Body mass index is 43.53 kg/(m^2). on 01/29/12  #Dyspnea    - dyspnea due to obesity, asthma (proven on mc challenge and EIB on CPST fall 2011) +/- VCD. Placed on qvar end 2011  December 19, 2010: Last visit was 09/06/2010. Started on QVAR at that time for new dx of asthma. Now reporting for fu. States qvar helping a lot. Less dyspneic. Rescue albueterol use is only 1 per 2 weeks. Less chest tightness. Improved dyspnea. Denies associated cough, wheeze. Still with some dysnea when she climbs stairs. No new complaints. Reports qvar compliance. STrluggling to lose weight PLAN  -discussed weight loss. Encouraged to joine a program likeweight watcher or zone diet - contnue qvar (sample given and MDI tech taught) - rov 6 months  OV 01/29/2012 Followup dyspnea due to above. Suppsed to have returned in 6 months since Jan 2012 but returning only a full year later. Has joined weight watchers past few months and has lost 7# weight. 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 Therefore, very little time to exercise.ll. Feels asthma is under control. Compliant with qvar but she feels that once she makes time to exercise qvar will not be sufficient. This is because when she climbs 2-3 flights of stairs she gets dyspneic and needs albuterol. She wants LABA for asthma as well.   Past, Family, Social reviewed: no change since  last visit other than noted in HPI     Review of Systems  Constitutional: Negative for fever and unexpected weight change.  HENT: Positive for rhinorrhea. Negative for ear pain, nosebleeds, congestion, sore throat, sneezing, trouble swallowing, dental problem, postnasal drip and sinus pressure.   Eyes: Negative for redness and itching.  Respiratory: Positive for chest tightness and shortness of breath. Negative for cough and wheezing.        Only when going up stairs  Cardiovascular: Positive for leg swelling. Negative for palpitations.  Gastrointestinal: Negative for nausea and vomiting.  Genitourinary: Negative for dysuria.  Musculoskeletal: Negative for joint swelling.  Skin: Negative for rash.  Neurological: Negative for headaches.  Hematological: Does not bruise/bleed easily.  Psychiatric/Behavioral: Negative for dysphoric mood. The patient is not nervous/anxious.        Objective:   Physical Exam  General:  well developed, well nourished, in no acute distressobese.   Head:  normocephalic and atraumatic Eyes:  PERRLA/EOM intact; conjunctiva and sclera clear Ears:  TMs intact and clear with normal canals Nose:  no deformity, discharge, inflammation, or lesions Mouth:  no deformity or lesions Neck:  no masses, thyromegaly, or abnormal cervical nodes Chest Wall:  no deformities noted Lungs:  clear bilaterally to auscultation and percussion Heart:  regular rate and rhythm, S1, S2 without murmurs, rubs, gallops, or clicks Abdomen:  bowel sounds positive; abdomen soft and non-tender without masses, or organomegaly Msk:  no   deformity or scoliosis noted with normal posture Pulses:  pulses normal Extremities:  left arm mid 1/3  - indurated area of 3cm but no redness or crepitus but moderately tender Neurologic:  CN II-XII grossly intact with normal reflexes, coordination, muscle strength and tone Skin:  intact without lesions or rashes Cervical Nodes:  no significant  adenopathy Axillary Nodes:  no significant adenopathy Psych:  alert and cooperative; normal mood and affect; normal attention span and concentration       Assessment & Plan:   

## 2012-01-30 ENCOUNTER — Encounter: Payer: Self-pay | Admitting: Internal Medicine

## 2012-01-30 NOTE — Assessment & Plan Note (Signed)
#  asthma   - glad this is stable  - due to increased exercise needs stop qvar but instated Please start symbicort 80/4.5 2 puff twice daily - take sample, script and show technique - take pm dose of symbicort 10 min before exercise in evening  - take albuterol 2 puff as needed - glad you had flu shot

## 2012-01-30 NOTE — Assessment & Plan Note (Signed)
Body mass index is 43.53 kg/(m^2). 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

## 2012-02-04 ENCOUNTER — Other Ambulatory Visit: Payer: Self-pay | Admitting: Plastic Surgery

## 2012-02-04 ENCOUNTER — Encounter (HOSPITAL_BASED_OUTPATIENT_CLINIC_OR_DEPARTMENT_OTHER): Payer: Self-pay | Admitting: *Deleted

## 2012-02-04 NOTE — Progress Notes (Signed)
Pt had hx rt lumpectomy- She is going to school-doing well-no labs needed

## 2012-02-04 NOTE — H&P (Signed)
History and Physical  [name]   02/01/2012 2:45 PM Office Visit  MRN: [id number]  Department:  Plastic Surgery  Dept Phone: [phone]  Description: Female DOB: [birth year]  Provider: Wayland Denis, DO    Diagnoses-   CA - breast cancer   - Primary   174.9      Reason for Visit  -   Breast Reduction     H&P; SX 02/07/12   Subjective:    Patient ID: [name] is a 51 y.o. female.  HPI The patient is a 51 yrs old wf here for her history and physical for breast symmetry surgery. 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 She had chemotherapy and radiation to the right breast. She is currently a 42C on the right and a 63 D on the left. She has not had any reconstruction and follow up mammagrams have been negative. She is interested in surgery for symmetry with reduction mastopexy on the left.   The following portions of the patient's history were reviewed and updated as appropriate: allergies, current medications, past family history, past medical history, past social history, past surgical history and problem list.  Review of Systems  Constitutional: Negative.   HENT: Negative.   Eyes: Negative.   Respiratory: Negative.   Cardiovascular: Negative.   Gastrointestinal: Negative.   Genitourinary: Negative.   Musculoskeletal: Negative.   Neurological: Negative.   Hematological: Negative.   Psychiatric/Behavioral: Negative.    Objective:   Physical Exam  Constitutional: She appears well-developed and well-nourished.  HENT:   Head: Normocephalic and atraumatic.  Eyes: Conjunctivae and EOM are normal. Pupils are equal, round, and reactive to light.  Neck: Normal range of motion.  Cardiovascular: Normal rate.   Pulmonary/Chest: Effort normal. No respiratory distress. She has no wheezes.  Abdominal: She exhibits no distension. There is no tenderness.  Musculoskeletal: Normal range of motion.  Neurological: She is alert.  Skin:  Skin is warm.  Psychiatric: She has a normal mood and affect. Her behavior is normal. Judgment and thought content normal.      Assessment:  1.  CA - breast cancer        Plan:   We are planning on doing a mastopexy/reduction on the left for symmetry.  The risks and complications were reviewed and include: bleeding, pain, scar, risk of anesthesia, hematoma, infection and asymmetry.    Medications Ordered in Office     Disp Refills Start End    cephalexin (KEFLEX) 500 MG capsule 28 capsule 0 02/01/2012      Take 1 capsule (500 mg total) by mouth 4 times daily. - Oral    promethazine (PHENERGAN) 12.5 MG tablet 10 tablet 0 02/01/2012 02/08/2012    Take 1 tablet (12.5 mg total) by mouth every 6 (six) hours as needed for 7 days for Nausea. - Oral    docusate sodium (COLACE) 100 MG capsule 20 capsule 0 02/01/2012      Take 1 capsule (100 mg total) by mouth 3 (three) times daily as needed for Constipation. - Oral    HYDROcodone-acetaminophen (NORCO) 5-325 mg per tablet 30 tablet 0 02/01/2012 02/11/2012    Take 1 tablet by mouth every 6 (six) hours as needed for 10 days for Pain. - Oral

## 2012-02-07 ENCOUNTER — Encounter (HOSPITAL_BASED_OUTPATIENT_CLINIC_OR_DEPARTMENT_OTHER): Payer: Self-pay | Admitting: Anesthesiology

## 2012-02-07 ENCOUNTER — Encounter (HOSPITAL_BASED_OUTPATIENT_CLINIC_OR_DEPARTMENT_OTHER): Payer: Self-pay | Admitting: Plastic Surgery

## 2012-02-07 ENCOUNTER — Encounter (HOSPITAL_BASED_OUTPATIENT_CLINIC_OR_DEPARTMENT_OTHER)
Admission: RE | Disposition: A | Discharge status: Home / Self Care | Payer: Self-pay | Source: Ambulatory Visit | Attending: Plastic Surgery

## 2012-02-07 ENCOUNTER — Ambulatory Visit (HOSPITAL_BASED_OUTPATIENT_CLINIC_OR_DEPARTMENT_OTHER)
Admission: RE | Admit: 2012-02-07 | Discharge: 2012-02-07 | Disposition: A | Discharge status: Home / Self Care | Payer: BC Managed Care – PPO | Source: Ambulatory Visit | Attending: Plastic Surgery | Admitting: Plastic Surgery

## 2012-02-07 ENCOUNTER — Ambulatory Visit (HOSPITAL_BASED_OUTPATIENT_CLINIC_OR_DEPARTMENT_OTHER): Payer: BC Managed Care – PPO | Admitting: Anesthesiology

## 2012-02-07 DIAGNOSIS — Z853 Personal history of malignant neoplasm of breast: Secondary | ICD-10-CM | POA: Insufficient documentation

## 2012-02-07 DIAGNOSIS — N6489 Other specified disorders of breast: Secondary | ICD-10-CM

## 2012-02-07 DIAGNOSIS — Z411 Encounter for cosmetic surgery: Secondary | ICD-10-CM | POA: Insufficient documentation

## 2012-02-07 HISTORY — DX: Bipolar disorder, unspecified: F31.9

## 2012-02-07 HISTORY — PX: MASTOPEXY: SHX5358

## 2012-02-07 LAB — POCT HEMOGLOBIN-HEMACUE: Hemoglobin: 13.8 g/dL (ref 12.0–15.0)

## 2012-02-07 SURGERY — MASTOPEXY
Anesthesia: General | Site: Breast | Laterality: Left | Wound class: Clean

## 2012-02-07 MED ORDER — FENTANYL CITRATE 0.05 MG/ML IJ SOLN
INTRAMUSCULAR | Status: DC | PRN
  Administered 2012-02-07: 25 ug via INTRAVENOUS
  Administered 2012-02-07: 50 ug via INTRAVENOUS
  Administered 2012-02-07 (×3): 25 ug via INTRAVENOUS
  Administered 2012-02-07: 50 ug via INTRAVENOUS

## 2012-02-07 MED ORDER — FENTANYL CITRATE 0.05 MG/ML IJ SOLN
50.0000 ug | INTRAMUSCULAR | Status: DC | PRN
  Administered 2012-02-07: 50 ug via INTRAVENOUS

## 2012-02-07 MED ORDER — BUPIVACAINE-EPINEPHRINE 0.25% -1:200000 IJ SOLN
INTRAMUSCULAR | Status: DC | PRN
  Administered 2012-02-07: 30 mL

## 2012-02-07 MED ORDER — MORPHINE SULFATE 2 MG/ML IJ SOLN: 0.0500 mg/kg | INTRAMUSCULAR | Status: DC | PRN

## 2012-02-07 MED ORDER — LACTATED RINGERS IV SOLN
INTRAVENOUS | Status: DC
  Administered 2012-02-07 (×4): via INTRAVENOUS

## 2012-02-07 MED ORDER — FENTANYL CITRATE 0.05 MG/ML IJ SOLN
25.0000 ug | INTRAMUSCULAR | Status: DC | PRN
  Administered 2012-02-07 (×2): 50 ug via INTRAVENOUS

## 2012-02-07 MED ORDER — CEPHALEXIN 500 MG PO CAPS: 500.0000 mg | ORAL_CAPSULE | Freq: Four times a day (QID) | ORAL | Status: AC

## 2012-02-07 MED ORDER — DEXAMETHASONE SODIUM PHOSPHATE 4 MG/ML IJ SOLN
INTRAMUSCULAR | Status: DC | PRN
  Administered 2012-02-07: 10 mg via INTRAVENOUS

## 2012-02-07 MED ORDER — METOCLOPRAMIDE HCL 5 MG/ML IJ SOLN: 10.0000 mg | Freq: Once | INTRAMUSCULAR | Status: DC | PRN

## 2012-02-07 MED ORDER — LIDOCAINE HCL (CARDIAC) 20 MG/ML IV SOLN
INTRAVENOUS | Status: DC | PRN
  Administered 2012-02-07: 50 mg via INTRAVENOUS

## 2012-02-07 MED ORDER — SODIUM CHLORIDE 0.9 % IV SOLN
INTRAVENOUS | Status: DC | PRN
  Administered 2012-02-07: 100 mL via INTRAMUSCULAR

## 2012-02-07 MED ORDER — HYDROCODONE-ACETAMINOPHEN 5-325 MG PO TABS: 1.0000 | ORAL_TABLET | Freq: Four times a day (QID) | ORAL | Status: AC | PRN

## 2012-02-07 MED ORDER — PROPOFOL 10 MG/ML IV EMUL
INTRAVENOUS | Status: DC | PRN
  Administered 2012-02-07: 250 mg via INTRAVENOUS

## 2012-02-07 MED ORDER — MIDAZOLAM HCL 2 MG/2ML IJ SOLN: 0.5000 mg | INTRAMUSCULAR | Status: DC | PRN

## 2012-02-07 MED ORDER — ONDANSETRON HCL 4 MG/2ML IJ SOLN
INTRAMUSCULAR | Status: DC | PRN
  Administered 2012-02-07: 4 mg via INTRAVENOUS

## 2012-02-07 MED ORDER — DROPERIDOL 2.5 MG/ML IJ SOLN
INTRAMUSCULAR | Status: DC | PRN
  Administered 2012-02-07: 0.625 mg via INTRAVENOUS

## 2012-02-07 MED ORDER — CEFAZOLIN SODIUM 1-5 GM-% IV SOLN
INTRAVENOUS | Status: DC | PRN
  Administered 2012-02-07: 2 g via INTRAVENOUS

## 2012-02-07 MED ORDER — DOCUSATE SODIUM 100 MG PO CAPS: 100.0000 mg | ORAL_CAPSULE | Freq: Three times a day (TID) | ORAL | Status: AC

## 2012-02-07 MED ORDER — SODIUM CHLORIDE 0.9 % IR SOLN
Status: DC | PRN
  Administered 2012-02-07: 14:00:00

## 2012-02-07 MED ORDER — ACETAMINOPHEN 10 MG/ML IV SOLN
1000.0000 mg | Freq: Once | INTRAVENOUS | Status: AC
  Administered 2012-02-07: 1000 mg via INTRAVENOUS

## 2012-02-07 MED ORDER — PROMETHAZINE HCL 12.5 MG PO TABS: 25.0000 mg | ORAL_TABLET | Freq: Four times a day (QID) | ORAL | Status: DC | PRN

## 2012-02-07 MED ORDER — MIDAZOLAM HCL 5 MG/5ML IJ SOLN
INTRAMUSCULAR | Status: DC | PRN
  Administered 2012-02-07: 2 mg via INTRAVENOUS

## 2012-02-07 SURGICAL SUPPLY — 60 items
BAG DECANTER FOR FLEXI CONT (MISCELLANEOUS) ×4 IMPLANT
BANDAGE GAUZE ELAST BULKY 4 IN (GAUZE/BANDAGES/DRESSINGS) IMPLANT
BINDER BREAST LRG (GAUZE/BANDAGES/DRESSINGS) IMPLANT
BINDER BREAST MEDIUM (GAUZE/BANDAGES/DRESSINGS) IMPLANT
BINDER BREAST XLRG (GAUZE/BANDAGES/DRESSINGS) IMPLANT
BINDER BREAST XXLRG (GAUZE/BANDAGES/DRESSINGS) ×2 IMPLANT
BIOPATCH RED 1 DISK 7.0 (GAUZE/BANDAGES/DRESSINGS) ×2 IMPLANT
BLADE HEX COATED 2.75 (ELECTRODE) ×2 IMPLANT
BLADE SURG 10 STRL SS (BLADE) ×6 IMPLANT
BLADE SURG 15 STRL LF DISP TIS (BLADE) ×2 IMPLANT
BLADE SURG 15 STRL SS (BLADE) ×2
CANISTER SUCTION 1200CC (MISCELLANEOUS) ×2 IMPLANT
CHLORAPREP W/TINT 26ML (MISCELLANEOUS) ×2 IMPLANT
CLOTH BEACON ORANGE TIMEOUT ST (SAFETY) ×2 IMPLANT
COVER MAYO STAND STRL (DRAPES) ×2 IMPLANT
COVER TABLE BACK 60X90 (DRAPES) ×2 IMPLANT
DECANTER SPIKE VIAL GLASS SM (MISCELLANEOUS) IMPLANT
DERMABOND ADVANCED (GAUZE/BANDAGES/DRESSINGS) ×2
DERMABOND ADVANCED .7 DNX12 (GAUZE/BANDAGES/DRESSINGS) ×2 IMPLANT
DRAIN CHANNEL 10F 3/8 F FF (DRAIN) IMPLANT
DRAIN CHANNEL 19F RND (DRAIN) ×2 IMPLANT
DRAPE LAPAROSCOPIC ABDOMINAL (DRAPES) ×2 IMPLANT
DRSG PAD ABDOMINAL 8X10 ST (GAUZE/BANDAGES/DRESSINGS) ×2 IMPLANT
DRSG TEGADERM 2-3/8X2-3/4 SM (GAUZE/BANDAGES/DRESSINGS) ×2 IMPLANT
ELECT REM PT RETURN 9FT ADLT (ELECTROSURGICAL) ×2
ELECTRODE REM PT RTRN 9FT ADLT (ELECTROSURGICAL) ×1 IMPLANT
EVACUATOR SILICONE 100CC (DRAIN) ×2 IMPLANT
GAUZE SPONGE 4X4 12PLY STRL LF (GAUZE/BANDAGES/DRESSINGS) IMPLANT
GLOVE BIO SURGEON STRL SZ 6.5 (GLOVE) ×8 IMPLANT
GLOVE SKINSENSE NS SZ7.0 (GLOVE) ×1
GLOVE SKINSENSE STRL SZ7.0 (GLOVE) ×1 IMPLANT
GOWN PREVENTION PLUS XLARGE (GOWN DISPOSABLE) ×4 IMPLANT
NEEDLE HYPO 25X1 1.5 SAFETY (NEEDLE) ×2 IMPLANT
NEEDLE SPNL 20GX3.5 QUINCKE YW (NEEDLE) ×2 IMPLANT
NS IRRIG 1000ML POUR BTL (IV SOLUTION) ×2 IMPLANT
PACK BASIN DAY SURGERY FS (CUSTOM PROCEDURE TRAY) ×2 IMPLANT
PENCIL BUTTON HOLSTER BLD 10FT (ELECTRODE) ×2 IMPLANT
PIN SAFETY STERILE (MISCELLANEOUS) ×2 IMPLANT
SHEET MEDIUM DRAPE 40X70 STRL (DRAPES) ×2 IMPLANT
SLEEVE SCD COMPRESS KNEE MED (MISCELLANEOUS) ×2 IMPLANT
SPONGE GAUZE 4X4 12PLY (GAUZE/BANDAGES/DRESSINGS) ×2 IMPLANT
SPONGE LAP 18X18 X RAY DECT (DISPOSABLE) ×8 IMPLANT
STRIP CLOSURE SKIN 1/2X4 (GAUZE/BANDAGES/DRESSINGS) IMPLANT
SUT MNCRL AB 3-0 PS2 18 (SUTURE) IMPLANT
SUT MNCRL AB 4-0 PS2 18 (SUTURE) IMPLANT
SUT MON AB 5-0 PS2 18 (SUTURE) ×6 IMPLANT
SUT PDS AB 3-0 PS2 18 (SUTURE) IMPLANT
SUT SILK 3 0 PS 1 (SUTURE) ×2 IMPLANT
SUT VIC AB 3-0 FS2 27 (SUTURE) ×2 IMPLANT
SUT VIC AB 3-0 PS1 18 (SUTURE) ×3
SUT VIC AB 3-0 PS1 18XBRD (SUTURE) ×3 IMPLANT
SUT VIC AB 5-0 PS2 18 (SUTURE) ×4 IMPLANT
SUT VICRYL 4-0 PS2 18IN ABS (SUTURE) ×6 IMPLANT
SYR BULB IRRIGATION 50ML (SYRINGE) ×2 IMPLANT
SYR CONTROL 10ML LL (SYRINGE) ×2 IMPLANT
TOWEL OR 17X24 6PK STRL BLUE (TOWEL DISPOSABLE) ×4 IMPLANT
TUBE CONNECTING 20X1/4 (TUBING) ×2 IMPLANT
UNDERPAD 30X30 INCONTINENT (UNDERPADS AND DIAPERS) ×4 IMPLANT
WATER STERILE IRR 1000ML POUR (IV SOLUTION) IMPLANT
YANKAUER SUCT BULB TIP NO VENT (SUCTIONS) ×2 IMPLANT

## 2012-02-07 NOTE — Anesthesia Postprocedure Evaluation (Signed)
Anesthesia Post Note  Patient: [name]  Procedure(s) Performed: Procedure(s) (LRB): MASTOPEXY (Left)  Anesthesia type: General  Patient location: PACU  Post pain: Pain level controlled  Post assessment: Patient's Cardiovascular Status Stable  Last Vitals:  Filed Vitals:   02/07/12 1700  BP: 144/72  Pulse: 70  Temp:   Resp:     Post vital signs: Reviewed and stable  Level of consciousness: alert  Complications: No apparent anesthesia complications

## 2012-02-07 NOTE — Discharge Instructions (Signed)
Follow up in one week Sink bath Daily Drain care About my Jackson-Pratt Bulb Drain  What is a Jackson-Pratt bulb? A Jackson-Pratt is a soft, round device used to collect drainage. It is connected to a long, thin drainage catheter, which is held in place by one or two small stiches near your surgical incision site. When the bulb is squeezed, it forms a vacuum, forcing the drainage to empty into the bulb.  Emptying the Jackson-Pratt bulb- To empty the bulb: 1. Release the plug on the top of the bulb. 2. Pour the bulb's contents into a measuring container which your nurse will provide. 3. Record the time emptied and amount of drainage. Empty the drain(s) as often as your     doctor or nurse recommends.  Date                  Time                    Amount (Drain 1)                 Amount (Drain 2)  _____________________________________________________________________  _____________________________________________________________________  _____________________________________________________________________  _____________________________________________________________________  _____________________________________________________________________  _____________________________________________________________________  _____________________________________________________________________  _____________________________________________________________________  Squeezing the Jackson-Pratt Bulb- To squeeze the bulb: 1. Make sure the plug at the top of the bulb is open. 2. Squeeze the bulb tightly in your fist. You will hear air squeezing from the bulb. 3. Replace the plug while the bulb is squeezed. 4. Use a safety pin to attach the bulb to your clothing. This will keep the catheter from     pulling at the bulb insertion site.  When to call your doctor- Call your doctor if:  Drain site becomes red, swollen or hot.  You have a fever greater than 101 degrees F.  There is oozing at  the drain site.  Drain falls out (apply a guaze bandage over the drain hole and secure it with tape).  Drainage increases daily not related to activity patterns. (You will usually have more drainage when you are active than when you are resting.)  Drainage has a bad odor.  [facility]  [address] [place], [place] [address] [phone]   Post Anesthesia Home Care Instructions  Activity: Get plenty of rest for the remainder of the day. A responsible adult should stay with you for 24 hours following the procedure.  For the next 24 hours, DO NOT: -Drive a car -Advertising copywriter -Drink alcoholic beverages -Take any medication unless instructed by your physician -Make any legal decisions or sign important papers.  Meals: Start with liquid foods such as gelatin or soup. Progress to regular foods as tolerated. Avoid greasy, spicy, heavy foods. If nausea and/or vomiting occur, drink only clear liquids until the nausea and/or vomiting subsides. Call your physician if vomiting continues.  Special Instructions/Symptoms: Your throat may feel dry or sore from the anesthesia or the breathing tube placed in your throat during surgery. If this causes discomfort, gargle with warm salt water. The discomfort should disappear within 24 hours.  [facility]  [address] [place], [place] [address] [phone]   Post Anesthesia Home Care Instructions  Activity: Get plenty of rest for the remainder of the day. A responsible adult should stay with you for 24 hours following the procedure.  For the next 24 hours, DO NOT: -Drive a car -Advertising copywriter -Drink alcoholic beverages -Take any medication unless instructed by your physician -Make  any legal decisions or sign important papers.  Meals: Start with liquid foods such as gelatin or soup. Progress to regular foods as tolerated. Avoid greasy, spicy, heavy foods. If nausea and/or vomiting occur,  drink only clear liquids until the nausea and/or vomiting subsides. Call your physician if vomiting continues.  Special Instructions/Symptoms: Your throat may feel dry or sore from the anesthesia or the breathing tube placed in your throat during surgery. If this causes discomfort, gargle with warm salt water. The discomfort should disappear within 24 hours.  [facility]  [address] [place], [place] [address] [phone]   Post Anesthesia Home Care Instructions  Activity: Get plenty of rest for the remainder of the day. A responsible adult should stay with you for 24 hours following the procedure.  For the next 24 hours, DO NOT: -Drive a car -Advertising copywriter -Drink alcoholic beverages -Take any medication unless instructed by your physician -Make any legal decisions or sign important papers.  Meals: Start with liquid foods such as gelatin or soup. Progress to regular foods as tolerated. Avoid greasy, spicy, heavy foods. If nausea and/or vomiting occur, drink only clear liquids until the nausea and/or vomiting subsides. Call your physician if vomiting continues.  Special Instructions/Symptoms: Your throat may feel dry or sore from the anesthesia or the breathing tube placed in your throat during surgery. If this causes discomfort, gargle with warm salt water. The discomfort should disappear within 24 hours.  Call your surgeon if you experience:   1.  Fever over 101.0. 2.  Inability to urinate. 3.  Nausea and/or vomiting. 4.  Extreme swelling or bruising at the surgical site. 5.  Continued bleeding from the incision. 6.  Increased pain, redness or drainage from the incision. 7.  Problems related to your pain medication.

## 2012-02-07 NOTE — Transfer of Care (Signed)
Immediate Anesthesia Transfer of Care Note  Patient: [name]  Procedure(s) Performed: Procedure(s) (LRB): MASTOPEXY (Left)  Patient Location: PACU  Anesthesia Type: General  Level of Consciousness: awake, alert  and oriented  Airway & Oxygen Therapy: Patient Spontanous Breathing and Patient connected to face mask oxygen  Post-op Assessment: Report given to PACU RN and Post -op Vital signs reviewed and stable  Post vital signs: Reviewed and stable  Complications: No apparent anesthesia complications

## 2012-02-07 NOTE — Brief Op Note (Signed)
02/07/2012  3:11 PM  PATIENT:  [name]  51 y.o. female  PRE-OPERATIVE DIAGNOSIS:  post right breast cancer with breast asymmetry and left mammary hypertrophy  POST-OPERATIVE DIAGNOSIS:  same  PROCEDURE:  Procedure(s) (LRB): LEFT MASTOPEXY REDUCTION  SURGEON:  Surgeon(s) and Role:    * Yarixa Lightcap Sanger, DO - Primary  PHYSICIAN ASSISTANT: none  ASSISTANTS: none   ANESTHESIA:   general  EBL:  Total I/O In: 2200 [I.V.:2200] Out: -   BLOOD ADMINISTERED:none  DRAINS: (1) Jackson-Pratt drain(s) with closed bulb suction in the left breast   LOCAL MEDICATIONS USED:  MARCAINE     SPECIMEN:  Excision  DISPOSITION OF SPECIMEN:  PATHOLOGY  COUNTS:  YES  TOURNIQUET:  * No tourniquets in log *  DICTATION: dictated  PLAN OF CARE: Discharge to home after PACU  PATIENT DISPOSITION:  PACU - hemodynamically stable.   Delay start of Pharmacological VTE agent (>24hrs) due to surgical blood loss or risk of bleeding: no

## 2012-02-07 NOTE — Interval H&P Note (Signed)
History and Physical Interval Note:  02/07/2012 10:55 AM  [name]  has presented today for surgery, with the diagnosis of breast cancer  The various methods of treatment have been discussed with the patient and family. After consideration of risks, benefits and other options for treatment, the patient has consented to  Procedure(s) (LRB): MASTOPEXY (Left) as a surgical intervention .  The patients' history has been reviewed, patient examined, no change in status, stable for surgery.  I have reviewed the patients' chart and labs.  Questions were answered to the patient's satisfaction.     SANGER,Jya Hughston

## 2012-02-07 NOTE — H&P (View-Only) (Signed)
Subjective:    Patient ID: [name], female    DOB: [birth year], 51 y.o.   MRN: [id number]  HPI # Breast cancer in 2005   - Treated at [facility]. Dr Doyne Keel.  Under remission per hx No oncologist in [place].  # Bilateral pulmonary embolisms in 2001    - "both lungs". Diagnosed at [facility]. Treatd in medical floor with heparin and coumadin. Per hx no oxygenation, mental status or hemodynamic issues at time of dx  - s/p coumadin Rx for 1 year. # She has had a history of 12 prior suicide attempts #Bipolar disorder #Hyperlipidemia #Obesity  Body mass index is 43.53 kg/(m^2). on 01/29/12  #Dyspnea    - dyspnea due to obesity, asthma (proven on mc challenge and EIB on CPST fall 2011) +/- VCD. Placed on qvar end 2011  December 19, 2010: Last visit was 09/06/2010. Started on QVAR at that time for new dx of asthma. Now reporting for fu. States qvar helping a lot. Less dyspneic. Rescue albueterol use is only 1 per 2 weeks. Less chest tightness. Improved dyspnea. Denies associated cough, wheeze. Still with some dysnea when she climbs stairs. No new complaints. Reports qvar compliance. STrluggling to lose weight PLAN  -discussed weight loss. Encouraged to joine a Cytogeneticist or zone diet - contnue qvar (sample given and MDI tech taught) - rov 6 months  OV 01/29/2012 Followup dyspnea due to above. Suppsed to have returned in 6 months since Jan 2012 but returning only a full year later. Has joined weight watchers past few months and has lost 7# weight. 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 Therefore, very little time to exercise.ll. Feels asthma is under control. Compliant with qvar but she feels that once she makes time to exercise qvar will not be sufficient. This is because when she climbs 2-3 flights of stairs she gets dyspneic and needs albuterol. She wants LABA for asthma as well.   Past, Family, Social reviewed: no change since  last visit other than noted in HPI     Review of Systems  Constitutional: Negative for fever and unexpected weight change.  HENT: Positive for rhinorrhea. Negative for ear pain, nosebleeds, congestion, sore throat, sneezing, trouble swallowing, dental problem, postnasal drip and sinus pressure.   Eyes: Negative for redness and itching.  Respiratory: Positive for chest tightness and shortness of breath. Negative for cough and wheezing.        Only when going up stairs  Cardiovascular: Positive for leg swelling. Negative for palpitations.  Gastrointestinal: Negative for nausea and vomiting.  Genitourinary: Negative for dysuria.  Musculoskeletal: Negative for joint swelling.  Skin: Negative for rash.  Neurological: Negative for headaches.  Hematological: Does not bruise/bleed easily.  Psychiatric/Behavioral: Negative for dysphoric mood. The patient is not nervous/anxious.        Objective:   Physical Exam  General:  well developed, well nourished, in no acute distressobese.   Head:  normocephalic and atraumatic Eyes:  PERRLA/EOM intact; conjunctiva and sclera clear Ears:  TMs intact and clear with normal canals Nose:  no deformity, discharge, inflammation, or lesions Mouth:  no deformity or lesions Neck:  no masses, thyromegaly, or abnormal cervical nodes Chest Wall:  no deformities noted Lungs:  clear bilaterally to auscultation and percussion Heart:  regular rate and rhythm, S1, S2 without murmurs, rubs, gallops, or clicks Abdomen:  bowel sounds positive; abdomen soft and non-tender without masses, or organomegaly Msk:  no  deformity or scoliosis noted with normal posture Pulses:  pulses normal Extremities:  left arm mid 1/3  - indurated area of 3cm but no redness or crepitus but moderately tender Neurologic:  CN II-XII grossly intact with normal reflexes, coordination, muscle strength and tone Skin:  intact without lesions or rashes Cervical Nodes:  no significant  adenopathy Axillary Nodes:  no significant adenopathy Psych:  alert and cooperative; normal mood and affect; normal attention span and concentration       Assessment & Plan:

## 2012-02-07 NOTE — Anesthesia Preprocedure Evaluation (Addendum)
Anesthesia Evaluation  Patient identified by MRN, date of birth, ID band Patient awake    Reviewed: Allergy & Precautions, H&P , NPO status , Patient's Chart, lab work & pertinent test results, reviewed documented beta blocker date and time   Airway Mallampati: II TM Distance: >3 FB Neck ROM: full    Dental   Pulmonary shortness of breath and with exertion, asthma ,          Cardiovascular neg cardio ROS     Neuro/Psych PSYCHIATRIC DISORDERS Negative Neurological ROS     GI/Hepatic negative GI ROS, Neg liver ROS,   Endo/Other  Morbid obesity  Renal/GU negative Renal ROS  Genitourinary negative   Musculoskeletal   Abdominal   Peds  Hematology negative hematology ROS (+)   Anesthesia Other Findings See surgeon's H&P   Reproductive/Obstetrics negative OB ROS                          Anesthesia Physical Anesthesia Plan  ASA: III  Anesthesia Plan: General   Post-op Pain Management:    Induction: Intravenous  Airway Management Planned: LMA  Additional Equipment:   Intra-op Plan:   Post-operative Plan: Extubation in OR  Informed Consent: I have reviewed the patients History and Physical, chart, labs and discussed the procedure including the risks, benefits and alternatives for the proposed anesthesia with the patient or authorized representative who has indicated his/her understanding and acceptance.     Plan Discussed with: CRNA and Surgeon  Anesthesia Plan Comments:         Anesthesia Quick Evaluation

## 2012-02-07 NOTE — Anesthesia Procedure Notes (Signed)
Procedure Name: LMA Insertion Date/Time: 02/07/2012 12:33 PM Performed by: Zenia Resides D Pre-anesthesia Checklist: Patient identified, Emergency Drugs available, Suction available, Patient being monitored and Timeout performed Patient Re-evaluated:Patient Re-evaluated prior to inductionOxygen Delivery Method: Circle System Utilized Preoxygenation: Pre-oxygenation with 100% oxygen Intubation Type: IV induction Ventilation: Mask ventilation without difficulty LMA: LMA with gastric port inserted LMA Size: 4.0 Grade View: Grade II Number of attempts: 1 Placement Confirmation: positive ETCO2 and breath sounds checked- equal and bilateral Tube secured with: Tape Dental Injury: Teeth and Oropharynx as per pre-operative assessment

## 2012-02-08 ENCOUNTER — Encounter (HOSPITAL_BASED_OUTPATIENT_CLINIC_OR_DEPARTMENT_OTHER): Payer: Self-pay | Admitting: Plastic Surgery

## 2012-02-08 NOTE — Op Note (Signed)
NAME:  [name], [name]                   ACCOUNT NO.:  MEDICAL RECORD NO.:  [id number]  LOCATION:                                 FACILITY:[facility]  PHYSICIAN:  Wayland Denis, DO      DATE OF BIRTH:  [birth year]  DATE OF PROCEDURE:  02/07/2012 DATE OF DISCHARGE:                              OPERATIVE REPORT   PREOPERATIVE DIAGNOSIS:  History of right breast cancer with excision and resulting breast asymmetry with left mammary hypertrophy.  POSTOPERATIVE DIAGNOSIS:  History of right breast cancer with excision and resulting breast asymmetry with left mammary hypertrophy.  PROCEDURE:  Left breast mastopexy reduction.  ATTENDING SURGEON:  Wayland Denis, DO  ANESTHESIA:  General.  INDICATION FOR PROCEDURE:  The patient is a 51 year old who underwent excision of a cancer of the right breast with a resultant severe asymmetry.  Decision was made to gain symmetry by mastopexy reduction on the left.  Risks and complications were reviewed and including bleeding, pain, scar, risk of anesthesia, hematoma, and infection.  Consent was signed, confirmed, and the patient was marked.  DESCRIPTION OF PROCEDURE:  The patient was taken to the operating room, placed on the operating room table in a supine position.  General anesthesia was administered.  A time-out was called.  All information was confirmed to be correct.  She was prepped and draped in the usual sterile fashion.  The marks were reinforced for the procedure with a marking pen.  The cookie cutter was used to mark around the nipple areola with a 40-mm cookie cutter.  The vertical limbs were then incised with a #10 blade.  The pedicle was 10 cm in width and was deepithelialized with a #10 blade.  Once that was complete, the medial and lateral flaps were lifted using electrocautery.  Hemostasis was achieved with the electrocautery as well.  The medial and lateral wedges were excised and the pocket was irrigated with normal saline  and antibiotic solution.  The pocket was lifted in order to create a space for the pedicle.  A total of 702 g of tissue was removed.  After hemostasis was achieved.  A drain was placed and tacked to the lateral inframammary incision with a 3-0 silk.  A 3-0 Vicryl was used to close the deep layers, followed by a 4-0 Vicryl running suture, and a 5-0 Monocryl was used to reapproximate the skin edges.  The patient was sat up, marks were confirmed, and the nipple areola was brought out with the cookie cutter as a template. It was tacked into place with 5-0 Vicryl and then a 5-0 Monocryl was used to reapproximate the skin edges.  Dermabond was applied, followed by ABDs and a breast binder.  The patient tolerated the procedure well.  There was excellent capillary refill and color of the nipple areola.     Wayland Denis, DO     CS/MEDQ  D:  02/07/2012  T:  02/08/2012  Job:  [id number]

## 2012-02-13 ENCOUNTER — Encounter (HOSPITAL_BASED_OUTPATIENT_CLINIC_OR_DEPARTMENT_OTHER): Payer: Self-pay

## 2012-05-08 ENCOUNTER — Other Ambulatory Visit: Payer: Self-pay | Admitting: Internal Medicine

## 2012-06-17 ENCOUNTER — Other Ambulatory Visit: Payer: Self-pay | Admitting: Internal Medicine

## 2012-06-17 DIAGNOSIS — Z1231 Encounter for screening mammogram for malignant neoplasm of breast: Secondary | ICD-10-CM

## 2012-06-25 ENCOUNTER — Emergency Department (HOSPITAL_COMMUNITY)
Admission: EM | Admit: 2012-06-25 | Discharge: 2012-06-25 | Disposition: A | Discharge status: Home / Self Care | Payer: 59 | Source: Home / Self Care | Attending: Emergency Medicine | Admitting: Emergency Medicine

## 2012-06-25 ENCOUNTER — Emergency Department (HOSPITAL_COMMUNITY): Payer: 59

## 2012-06-25 ENCOUNTER — Encounter (HOSPITAL_COMMUNITY): Payer: Self-pay | Admitting: *Deleted

## 2012-06-25 DIAGNOSIS — Z87891 Personal history of nicotine dependence: Secondary | ICD-10-CM | POA: Insufficient documentation

## 2012-06-25 DIAGNOSIS — Z79899 Other long term (current) drug therapy: Secondary | ICD-10-CM | POA: Insufficient documentation

## 2012-06-25 DIAGNOSIS — S82843A Displaced bimalleolar fracture of unspecified lower leg, initial encounter for closed fracture: Secondary | ICD-10-CM | POA: Insufficient documentation

## 2012-06-25 DIAGNOSIS — X500XXA Overexertion from strenuous movement or load, initial encounter: Secondary | ICD-10-CM | POA: Insufficient documentation

## 2012-06-25 DIAGNOSIS — Z9089 Acquired absence of other organs: Secondary | ICD-10-CM | POA: Insufficient documentation

## 2012-06-25 DIAGNOSIS — J45909 Unspecified asthma, uncomplicated: Secondary | ICD-10-CM | POA: Insufficient documentation

## 2012-06-25 DIAGNOSIS — F3189 Other bipolar disorder: Secondary | ICD-10-CM | POA: Insufficient documentation

## 2012-06-25 DIAGNOSIS — Y92009 Unspecified place in unspecified non-institutional (private) residence as the place of occurrence of the external cause: Secondary | ICD-10-CM | POA: Insufficient documentation

## 2012-06-25 MED ORDER — OXYCODONE-ACETAMINOPHEN 5-325 MG PO TABS: 1.0000 | ORAL_TABLET | ORAL | Status: DC | PRN

## 2012-06-25 MED ORDER — OXYCODONE-ACETAMINOPHEN 5-325 MG PO TABS
2.0000 | ORAL_TABLET | Freq: Once | ORAL | Status: AC
  Administered 2012-06-25: 2 via ORAL
  Filled 2012-06-25: qty 2

## 2012-06-25 NOTE — ED Notes (Signed)
Ice pack applied.

## 2012-06-25 NOTE — ED Notes (Signed)
Patient transported to X-ray 

## 2012-06-25 NOTE — ED Notes (Signed)
Ortho Tech at bedside.  

## 2012-06-25 NOTE — ED Notes (Signed)
NP at bedside.

## 2012-06-25 NOTE — Progress Notes (Signed)
Orthopedic Tech Progress Note Patient Details:  [name] 1960-12-29 [id number]  Ortho Devices Type of Ortho Device: Crutches;Stirrup splint;Short leg splint Ortho Device/Splint Location: (R) LE Ortho Device/Splint Interventions: Application   Jennye Moccasin 06/25/2012, 11:03 PM

## 2012-06-25 NOTE — ED Provider Notes (Signed)
History     CSN: [id number]  Arrival date & time 06/25/12  2005   None     Chief Complaint  Patient presents with  . Ankle Pain    (Consider location/radiation/quality/duration/timing/severity/associated sxs/prior treatment) HPI Comments: Patient states she was chasing a spider on her porch deck, when she misstepped and fell off a short distance less than 3 inches, twisting her right ankle.  She felt a pop in her ankle and pain in the posterior calf area.  She is a small abrasion to the lateral aspect of the upper shin area.  Due to the fall.  Her tetanus status is up-to-date.  She has not taken any over-the-counter medication.  She was on the ground for 3-5 minutes while she was calling for help, as she could not get up on her own, power her "rescuer" he drove her to the emergency department, and left her.  She will call for right home.  She has no previous history of injury to that ankle.  She does note, that she had immediate swelling in the lateral aspect.  Patient is a 51 y.o. female presenting with ankle pain. The history is provided by the patient.  Ankle Pain  The incident occurred 3 to 5 hours ago. The incident occurred at home. The injury mechanism was a fall. The pain is present in the right ankle. Pertinent negatives include no numbness.    Past Medical History  Diagnosis Date  . Asthma   . Depression   . Bipolar affective disorder, depressed, mild     Remote history of suicidal attempts  . Breast cancer     In 2005 treated with surgery and Arimidex for 5 years  . History of pulmonary embolism     Bilateral in 2001  . Bipolar 1 disorder     Past Surgical History  Procedure Date  . Breast lumpectomy   . Abdominal hysterectomy   . Mastopexy 02/07/2012    Procedure: MASTOPEXY;  Surgeon: Wayland Denis, DO;  Location: [facility];  Service: Plastics;  Laterality: Left;  left breast mastopexy/reduction for symmetry after right breast cancer    Family  History  Problem Relation Age of Onset  . Cancer Father   . Diabetes Maternal Aunt   . Cancer Maternal Aunt   . Cancer Paternal Grandmother     History  Substance Use Topics  . Smoking status: Former Smoker -- 30 years    Quit date: 06/09/2010  . Smokeless tobacco: Never Used  . 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  Musculoskeletal: Positive for joint swelling.  Skin: Positive for wound.  Neurological: Negative for dizziness, weakness and numbness.    Allergies  Gabapentin; Codeine; Lamotrigine; and Topiramate  Home Medications   Current Outpatient Rx  Name Route Sig Dispense Refill  . ALBUTEROL SULFATE HFA 108 (90 BASE) MCG/ACT IN AERS Inhalation Inhale 2 puffs into the lungs every 6 (six) hours as needed. sob     . BUDESONIDE-FORMOTEROL FUMARATE 80-4.5 MCG/ACT IN AERO Inhalation Inhale 2 puffs into the lungs 2 (two) times daily.    Marland Kitchen FLUOXETINE HCL 20 MG PO CAPS Oral Take 20 mg by mouth daily.      Marland Kitchen FLUTICASONE  PROPIONATE 50 MCG/ACT NA SUSP Nasal Place 2 sprays into the nose daily. 16 g 2  . LITHIUM CARBONATE 600 MG PO CAPS Oral Take 600 mg by mouth 2 (two) times daily with a meal.     . RISPERIDONE 1 MG PO TABS Oral Take 1 mg by mouth daily.      . OXYCODONE-ACETAMINOPHEN 5-325 MG PO TABS Oral Take 1 tablet by mouth every 4 (four) hours as needed for pain. 30 tablet 0  . PROMETHAZINE HCL 12.5 MG PO TABS Oral Take 2 tablets (25 mg total) by mouth every 6 (six) hours as needed for nausea. 20 tablet 0    BP 104/70  Pulse 88  Temp 98.8 F (37.1 C) (Oral)  Resp 18  SpO2 93%  Physical Exam  Nursing note and vitals reviewed. Constitutional: She is oriented to person, place, and time. She appears well-developed and well-nourished.  HENT:  Head: Normocephalic.  Eyes: Pupils are equal, round, and  reactive to light.  Neck: Normal range of motion.  Cardiovascular: Normal rate.   Pulmonary/Chest: Effort normal.  Musculoskeletal: She exhibits edema and tenderness.       Feet:  Neurological: She is alert and oriented to person, place, and time.  Skin: Skin is warm.       Facial abrasion, lateral aspect of the anterior right shin    ED Course  Procedures (including critical care time)  Labs Reviewed - No data to display Dg Tibia/fibula Right  06/25/2012  *RADIOLOGY REPORT*  Clinical Data: Ankle pain, distal fibular fracture  RIGHT TIBIA AND FIBULA - 2 VIEW  Comparison: None.  Findings: Oblique fracture of the distal fibular shaft, displaced to 3 mm. Suspect transverse avulsion fracture from the tip of the medial malleolus, distracted approximately 1 mm.  Posterior malleolus appears intact.  No proximal   lesion is evident.  Normal mineralization and alignment.  No significant degenerative changes.  IMPRESSION:  1.  Bimalleolar fractures as above.  Original Report Authenticated By: Osa Craver, M.D.   Dg Ankle Complete Right  06/25/2012  *RADIOLOGY REPORT*  Clinical Data: Larey Seat, pain  RIGHT ANKLE - COMPLETE 3+ VIEW  Comparison: None.  Findings: There is a spiral  fracture of the distal fibula with soft tissue swelling. Slight widening of the fracture inferiorly, seen best on lateral view.  Ununited ossification center versus old fracture inferior medial malleolus.  Marked soft tissue swelling. Ankle mortise intact.  IMPRESSION: Nondisplaced fracture distal fibula.  Soft tissue swelling.  Original Report Authenticated By: Elsie Stain, M.D.     1. Ankle fracture, bimalleolar, closed       MDM   Ankle.  X-ray was ordered at triage.  This was reviewed by me revealing a nondisplaced distal fibula fracture.  I have requested the patient be given pain medication, as well as additional x-ray of the complete tib-fib.  View of the second set of x-rays revealed that she has a tiny  fracture at the, distal tip of the tibia, making this a bilateral malleolus fracture.  Patient has been placed in a stirrup and posterior splint.  I spoke with Dr. Charlann Boxer can see her in the office on Friday as she is unsure, who her family has seen as far as Orthopedist in the past She has been provided with crutches, and I will discharge her home with pain control. Patient's toes are warm, and pink, with full mobility after splint has been placed       Cipriano Mile  Manus Rudd, NP 06/25/12 [id number]  Arman Filter, NP 06/25/12 [phone]

## 2012-06-25 NOTE — ED Notes (Signed)
Patient fell and injured her right ankle.  Patient's ankle is swollen and painful.  CMS intact

## 2012-06-25 NOTE — ED Notes (Signed)
Patient transported from X-ray 

## 2012-06-26 ENCOUNTER — Inpatient Hospital Stay (HOSPITAL_COMMUNITY)
Admission: AD | Admit: 2012-06-26 | Discharge: 2012-06-28 | DRG: 493 | Disposition: A | Discharge status: Home / Self Care | Payer: 59 | Source: Ambulatory Visit | Attending: Orthopedic Surgery | Admitting: Orthopedic Surgery

## 2012-06-26 ENCOUNTER — Encounter (HOSPITAL_COMMUNITY)
Admission: AD | Disposition: A | Discharge status: Home / Self Care | Payer: Self-pay | Source: Ambulatory Visit | Attending: Orthopedic Surgery

## 2012-06-26 ENCOUNTER — Encounter (HOSPITAL_COMMUNITY): Payer: Self-pay

## 2012-06-26 ENCOUNTER — Ambulatory Visit (HOSPITAL_COMMUNITY): Payer: 59 | Admitting: *Deleted

## 2012-06-26 ENCOUNTER — Other Ambulatory Visit (HOSPITAL_COMMUNITY): Payer: Self-pay | Admitting: Orthopedic Surgery

## 2012-06-26 ENCOUNTER — Ambulatory Visit (HOSPITAL_COMMUNITY): Payer: 59

## 2012-06-26 ENCOUNTER — Encounter (HOSPITAL_COMMUNITY): Payer: Self-pay | Admitting: *Deleted

## 2012-06-26 DIAGNOSIS — Z79899 Other long term (current) drug therapy: Secondary | ICD-10-CM

## 2012-06-26 DIAGNOSIS — S8263XA Displaced fracture of lateral malleolus of unspecified fibula, initial encounter for closed fracture: Principal | ICD-10-CM | POA: Diagnosis present

## 2012-06-26 DIAGNOSIS — Z86711 Personal history of pulmonary embolism: Secondary | ICD-10-CM

## 2012-06-26 DIAGNOSIS — W010XXA Fall on same level from slipping, tripping and stumbling without subsequent striking against object, initial encounter: Secondary | ICD-10-CM | POA: Diagnosis present

## 2012-06-26 DIAGNOSIS — S82899A Other fracture of unspecified lower leg, initial encounter for closed fracture: Secondary | ICD-10-CM

## 2012-06-26 DIAGNOSIS — Z86718 Personal history of other venous thrombosis and embolism: Secondary | ICD-10-CM

## 2012-06-26 DIAGNOSIS — Z6841 Body Mass Index (BMI) 40.0 and over, adult: Secondary | ICD-10-CM

## 2012-06-26 HISTORY — DX: Reserved for concepts with insufficient information to code with codable children: IMO0002

## 2012-06-26 HISTORY — DX: Headache: R51

## 2012-06-26 HISTORY — DX: Angina pectoris, unspecified: I20.9

## 2012-06-26 HISTORY — PX: ORIF ANKLE FRACTURE: SHX5408

## 2012-06-26 HISTORY — DX: Shortness of breath: R06.02

## 2012-06-26 LAB — PROTIME-INR
INR: 0.98 (ref 0.00–1.49)
Prothrombin Time: 13.2 seconds (ref 11.6–15.2)

## 2012-06-26 LAB — BASIC METABOLIC PANEL
Chloride: 100 mEq/L (ref 96–112)
GFR calc Af Amer: >90 mL/min (ref >90)
GFR calc non Af Amer: 81 mL/min — ABNORMAL LOW (ref >90)
Potassium: 4.2 mEq/L (ref 3.5–5.1)
Sodium: 135 mEq/L (ref 135–145)

## 2012-06-26 LAB — CBC
HCT: 39.1 % (ref 36.0–46.0)
Hemoglobin: 12.9 g/dL (ref 12.0–15.0)
WBC: 7 10*3/uL (ref 4.0–10.5)

## 2012-06-26 LAB — SURGICAL PCR SCREEN: MRSA, PCR: NEGATIVE

## 2012-06-26 LAB — APTT: aPTT: 27 seconds (ref 24–37)

## 2012-06-26 IMAGING — RF DG C-ARM 61-120 MIN
1 series · 3 of 3 positions shown · non-contrast
Comparison: Preoperative study [DATE]

CLINICAL DATA: Intraoperative internal fixation of fracture of the
right distal fibula.

DG C-ARM 1-60 MIN,RIGHT ANKLE - COMPLETE 3+ VIEW

[Series 1: run · 3 of 3 slices shown]
[im 1/3]
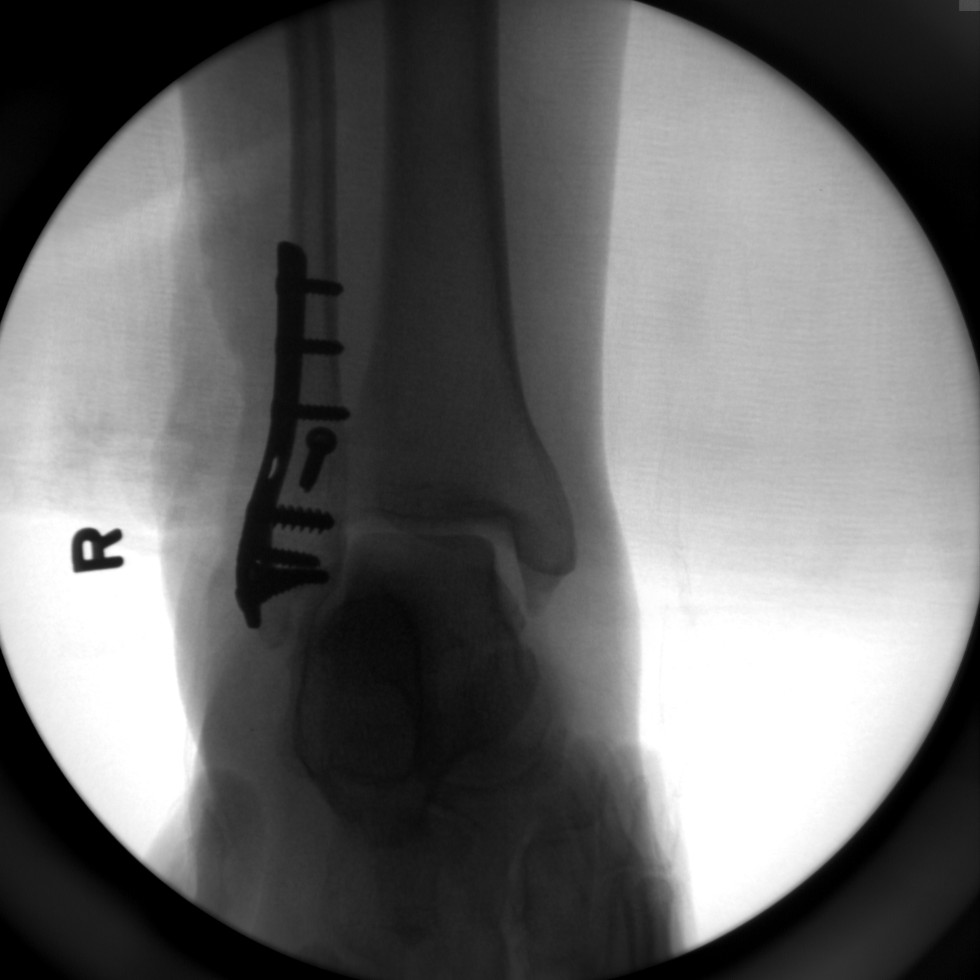
[im 2/3]
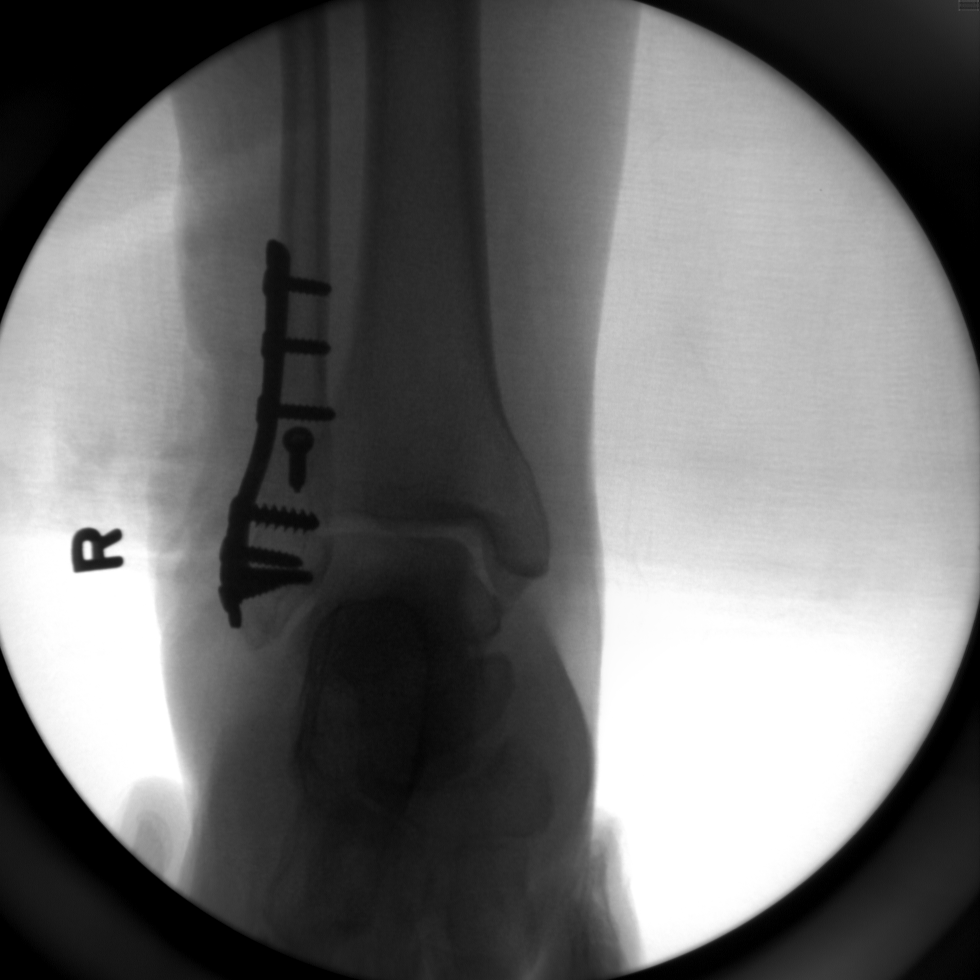
[im 3/3]
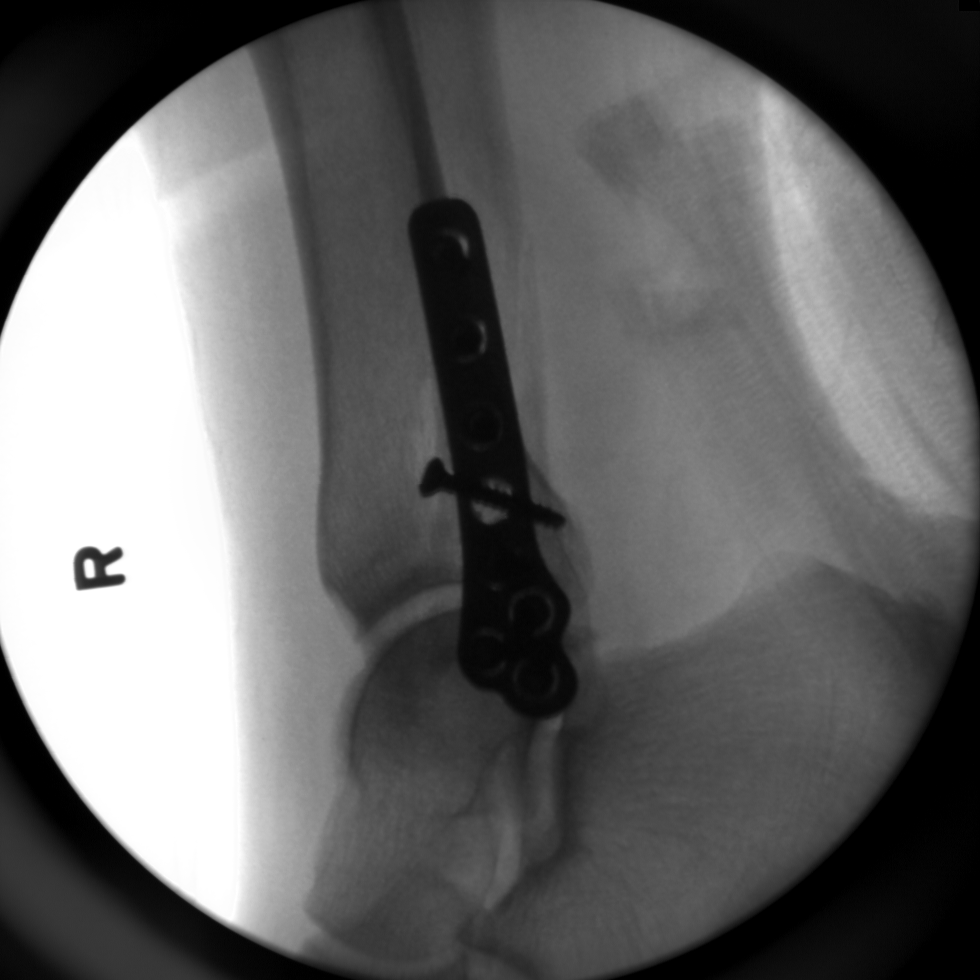

[3 of 3 positions shown; findings below may reference images not displayed]

FINDINGS: Intraoperative spot fluoroscopic views of the right
ankle were obtained for surgical control purposes.  There is
interval plate and screw fixation of the previously demonstrated
fracture of the distal right fibula.  Improved alignment and
position is noted.
IMPRESSION: Spot fluoroscopic views of the right ankle obtained
intraoperatively for surgical control purposes.  Plate screw
fixation of a fracture of the distal right fibula.]

## 2012-06-26 SURGERY — OPEN REDUCTION INTERNAL FIXATION (ORIF) ANKLE FRACTURE
Anesthesia: General | Site: Ankle | Laterality: Right | Wound class: Clean

## 2012-06-26 MED ORDER — DEXAMETHASONE SODIUM PHOSPHATE 4 MG/ML IJ SOLN
INTRAMUSCULAR | Status: DC | PRN
  Administered 2012-06-26: 4 mg via INTRAVENOUS

## 2012-06-26 MED ORDER — ONDANSETRON HCL 4 MG/2ML IJ SOLN
INTRAMUSCULAR | Status: DC | PRN
  Administered 2012-06-26: 4 mg via INTRAVENOUS

## 2012-06-26 MED ORDER — CHLORHEXIDINE GLUCONATE 4 % EX LIQD: 60.0000 mL | Freq: Once | CUTANEOUS | Status: DC

## 2012-06-26 MED ORDER — GLYCOPYRROLATE 0.2 MG/ML IJ SOLN
INTRAMUSCULAR | Status: DC | PRN
  Administered 2012-06-26: 0.4 mg via INTRAVENOUS

## 2012-06-26 MED ORDER — ROCURONIUM BROMIDE 100 MG/10ML IV SOLN
INTRAVENOUS | Status: DC | PRN
  Administered 2012-06-26: 50 mg via INTRAVENOUS

## 2012-06-26 MED ORDER — LACTATED RINGERS IV SOLN
INTRAVENOUS | Status: DC | PRN
  Administered 2012-06-26 (×2): via INTRAVENOUS

## 2012-06-26 MED ORDER — DROPERIDOL 2.5 MG/ML IJ SOLN
INTRAMUSCULAR | Status: DC | PRN
  Administered 2012-06-26: 0.625 mg via INTRAVENOUS

## 2012-06-26 MED ORDER — BUPIVACAINE HCL (PF) 0.5 % IJ SOLN
INTRAMUSCULAR | Status: DC | PRN
  Administered 2012-06-26: 10 mL

## 2012-06-26 MED ORDER — PROPOFOL 10 MG/ML IV BOLUS
INTRAVENOUS | Status: DC | PRN
  Administered 2012-06-26: 200 mg via INTRAVENOUS

## 2012-06-26 MED ORDER — NEOSTIGMINE METHYLSULFATE 1 MG/ML IJ SOLN
INTRAMUSCULAR | Status: DC | PRN
  Administered 2012-06-26: 3 mg via INTRAVENOUS

## 2012-06-26 MED ORDER — BUPIVACAINE-EPINEPHRINE PF 0.5-1:200000 % IJ SOLN
INTRAMUSCULAR | Status: DC | PRN
  Administered 2012-06-26: 30 mL

## 2012-06-26 MED ORDER — LIDOCAINE HCL (CARDIAC) 20 MG/ML IV SOLN
INTRAVENOUS | Status: DC | PRN
  Administered 2012-06-26: 60 mg via INTRAVENOUS

## 2012-06-26 MED ORDER — DEXTROSE 5 % IV SOLN
3.0000 g | INTRAVENOUS | Status: AC
  Administered 2012-06-26: 3 g via INTRAVENOUS
  Filled 2012-06-26: qty 30

## 2012-06-26 MED ORDER — MIDAZOLAM HCL 5 MG/5ML IJ SOLN
INTRAMUSCULAR | Status: DC | PRN
  Administered 2012-06-26 (×2): 1 mg via INTRAVENOUS

## 2012-06-26 MED ORDER — HYDROMORPHONE HCL PF 1 MG/ML IJ SOLN: 0.2500 mg | INTRAMUSCULAR | Status: DC | PRN

## 2012-06-26 MED ORDER — 0.9 % SODIUM CHLORIDE (POUR BTL) OPTIME
TOPICAL | Status: DC | PRN
  Administered 2012-06-26: 1000 mL

## 2012-06-26 MED ORDER — FENTANYL CITRATE 0.05 MG/ML IJ SOLN
INTRAMUSCULAR | Status: DC | PRN
  Administered 2012-06-26: 100 ug via INTRAVENOUS
  Administered 2012-06-26: 50 ug via INTRAVENOUS
  Administered 2012-06-26: 100 ug via INTRAVENOUS

## 2012-06-26 MED ORDER — OXYCODONE-ACETAMINOPHEN 5-325 MG PO TABS: 1.0000 | ORAL_TABLET | Freq: Once | ORAL | Status: DC

## 2012-06-26 SURGICAL SUPPLY — 66 items
BANDAGE ELASTIC 4 VELCRO ST LF (GAUZE/BANDAGES/DRESSINGS) ×2 IMPLANT
BANDAGE ELASTIC 6 VELCRO ST LF (GAUZE/BANDAGES/DRESSINGS) ×2 IMPLANT
BANDAGE GAUZE ELAST BULKY 4 IN (GAUZE/BANDAGES/DRESSINGS) IMPLANT
BIT DRILL 3.5 QC 155 (BIT) ×2 IMPLANT
BIT DRILL QC 2.7 6.3IN  SHORT (BIT) ×1
BIT DRILL QC 2.7 6.3IN SHORT (BIT) ×1 IMPLANT
BLADE SURG 10 STRL SS (BLADE) IMPLANT
BNDG COHESIVE 6X5 TAN STRL LF (GAUZE/BANDAGES/DRESSINGS) ×2 IMPLANT
BNDG ESMARK 4X9 LF (GAUZE/BANDAGES/DRESSINGS) ×2 IMPLANT
CLOTH BEACON ORANGE TIMEOUT ST (SAFETY) ×2 IMPLANT
COVER MAYO STAND STRL (DRAPES) IMPLANT
COVER SURGICAL LIGHT HANDLE (MISCELLANEOUS) ×2 IMPLANT
CUFF TOURNIQUET SINGLE 34IN LL (TOURNIQUET CUFF) ×2 IMPLANT
CUFF TOURNIQUET SINGLE 44IN (TOURNIQUET CUFF) IMPLANT
DRAPE C-ARM 42X72 X-RAY (DRAPES) ×2 IMPLANT
DRAPE INCISE IOBAN 66X45 STRL (DRAPES) IMPLANT
DRAPE SURG 17X23 STRL (DRAPES) ×2 IMPLANT
DRAPE U-SHAPE 47X51 STRL (DRAPES) ×2 IMPLANT
DRSG PAD ABDOMINAL 8X10 ST (GAUZE/BANDAGES/DRESSINGS) ×10 IMPLANT
DURAPREP 26ML APPLICATOR (WOUND CARE) IMPLANT
ELECT REM PT RETURN 9FT ADLT (ELECTROSURGICAL) ×2
ELECTRODE REM PT RTRN 9FT ADLT (ELECTROSURGICAL) ×1 IMPLANT
FACESHIELD LNG OPTICON STERILE (SAFETY) IMPLANT
GAUZE XEROFORM 5X9 LF (GAUZE/BANDAGES/DRESSINGS) ×4 IMPLANT
GLOVE BIOGEL PI IND STRL 8 (GLOVE) ×1 IMPLANT
GLOVE BIOGEL PI INDICATOR 8 (GLOVE) ×1
GLOVE SURG ORTHO 8.0 STRL STRW (GLOVE) ×2 IMPLANT
GOWN PREVENTION PLUS LG XLONG (DISPOSABLE) IMPLANT
GOWN STRL NON-REIN LRG LVL3 (GOWN DISPOSABLE) ×6 IMPLANT
HANDPIECE INTERPULSE COAX TIP (DISPOSABLE)
KIT BASIN OR (CUSTOM PROCEDURE TRAY) ×2 IMPLANT
KIT ROOM TURNOVER OR (KITS) ×2 IMPLANT
MANIFOLD NEPTUNE II (INSTRUMENTS) ×2 IMPLANT
NEEDLE HYPO 25GX1X1/2 BEV (NEEDLE) IMPLANT
NS IRRIG 1000ML POUR BTL (IV SOLUTION) ×2 IMPLANT
PACK ORTHO EXTREMITY (CUSTOM PROCEDURE TRAY) ×2 IMPLANT
PAD ARMBOARD 7.5X6 YLW CONV (MISCELLANEOUS) ×4 IMPLANT
PAD CAST 4YDX4 CTTN HI CHSV (CAST SUPPLIES) ×2 IMPLANT
PADDING CAST COTTON 4X4 STRL (CAST SUPPLIES) ×2
PLATE FIBULA DIST 4H (Plate) ×2 IMPLANT
SCREW CANC 5.0X14 (Screw) ×2 IMPLANT
SCREW LOCK 10X3.5XST NS (Screw) ×1 IMPLANT
SCREW LOCK 12X3.5XST PRLC (Screw) ×1 IMPLANT
SCREW LOCK 3.5X10 (Screw) ×1 IMPLANT
SCREW LOCK 3.5X12 (Screw) ×1 IMPLANT
SCREW LOCK 3.5X14 (Screw) ×2 IMPLANT
SCREW NL 3.5X22 (Screw) ×2 IMPLANT
SCREW NON LOCK 3.5X12 (Screw) ×2 IMPLANT
SCREW NONLOCK 3.5X10 (Screw) ×4 IMPLANT
SCREW NONLOCK 3.5X18 (Screw) ×2 IMPLANT
SET HNDPC FAN SPRY TIP SCT (DISPOSABLE) IMPLANT
SPLINT PLASTER CAST XFAST 5X30 (CAST SUPPLIES) ×1 IMPLANT
SPLINT PLASTER XFAST SET 5X30 (CAST SUPPLIES) ×1
SPONGE GAUZE 4X4 12PLY (GAUZE/BANDAGES/DRESSINGS) ×2 IMPLANT
STOCKINETTE IMPERVIOUS 9X36 MD (GAUZE/BANDAGES/DRESSINGS) ×2 IMPLANT
SUCTION FRAZIER TIP 10 FR DISP (SUCTIONS) ×2 IMPLANT
SUT ETHILON 3 0 PS 1 (SUTURE) ×6 IMPLANT
SUT VIC AB 2-0 CTB1 (SUTURE) ×2 IMPLANT
SUT VIC AB 3-0 SH 27 (SUTURE) ×1
SUT VIC AB 3-0 SH 27X BRD (SUTURE) ×1 IMPLANT
SYR CONTROL 10ML LL (SYRINGE) ×2 IMPLANT
TOWEL OR 17X24 6PK STRL BLUE (TOWEL DISPOSABLE) ×2 IMPLANT
TOWEL OR 17X26 10 PK STRL BLUE (TOWEL DISPOSABLE) ×2 IMPLANT
TUBE CONNECTING 12X1/4 (SUCTIONS) ×2 IMPLANT
WATER STERILE IRR 1000ML POUR (IV SOLUTION) IMPLANT
YANKAUER SUCT BULB TIP NO VENT (SUCTIONS) ×2 IMPLANT

## 2012-06-26 NOTE — Progress Notes (Signed)
C/O RIGHT ANKLE PAIN 7/10 AND DR Sampson Goon NOTIFIED AND ORDER NOTED AND THEN CLIENT STATES DOES NOT WANT ANYTHING FOR PAIN "WILL JUST WAIT" STATES AFRAID WOULD GET NAUSEATED TAKING MED ON EMPTY STOMACH

## 2012-06-26 NOTE — ED Provider Notes (Signed)
Medical screening examination/treatment/procedure(s) were performed by non-physician practitioner and as supervising physician I was immediately available for consultation/collaboration.   Loren Racer, MD 06/26/12 [phone]

## 2012-06-26 NOTE — Anesthesia Postprocedure Evaluation (Signed)
  Anesthesia Post-op Note  Patient: [name]  Procedure(s) Performed: Procedure(s) (LRB): OPEN REDUCTION INTERNAL FIXATION (ORIF) ANKLE FRACTURE (Right)  Patient Location: PACU  Anesthesia Type: GA combined with regional for post-op pain  Level of Consciousness: awake  Airway and Oxygen Therapy: Patient Spontanous Breathing and Patient connected to nasal cannula oxygen  Post-op Pain: none  Post-op Assessment: Post-op Vital signs reviewed, Patient's Cardiovascular Status Stable, Respiratory Function Stable, Patent Airway and No signs of Nausea or vomiting  Post-op Vital Signs: Reviewed and stable  Complications: No apparent anesthesia complications

## 2012-06-26 NOTE — Brief Op Note (Signed)
06/26/2012  11:25 PM  PATIENT:  Carley Hammed  51 y.o. female  PRE-OPERATIVE DIAGNOSIS:  right ankle fracture  POST-OPERATIVE DIAGNOSIS:  right ankle fracture  PROCEDURE:  Procedure(s): OPEN REDUCTION INTERNAL FIXATION (ORIF) ANKLE FRACTURE  SURGEON:  Surgeon(s): Cammy Copa, MD  ASSISTANT: none  ANESTHESIA:   general  EBL: 15 ml    Total I/O In: 2500 [I.V.:2500] Out: -   BLOOD ADMINISTERED: none  DRAINS: none   LOCAL MEDICATIONS USED:  none  SPECIMEN:  No Specimen  COUNTS:  YES  TOURNIQUET:   Total Tourniquet Time Documented: Calf (Right) - 22 minutes  DICTATION: .Other Dictation: Dictation Number done  PLAN OF CARE: Admit to inpatient   PATIENT DISPOSITION:  PACU - hemodynamically stable

## 2012-06-26 NOTE — Progress Notes (Signed)
Call to Dr. Jean Rosenthal, reported pt. last ekg during hosp. for chest pain reflecting abnormal result.  Reported history of PE as well. Will repeat ekg now.

## 2012-06-26 NOTE — Preoperative (Signed)
Beta Blockers   Reason not to administer Beta Blockers:Not Applicable 

## 2012-06-26 NOTE — Anesthesia Procedure Notes (Signed)
Anesthesia Regional Block:  Popliteal block  Pre-Anesthetic Checklist: ,, timeout performed, Correct Patient, Correct Site, Correct Laterality, Correct Procedure, Correct Position, site marked, Risks and benefits discussed, pre-op evaluation, post-op pain management  Laterality: Right  Prep: Maximum Sterile Barrier Precautions used and chloraprep       Needles:  Injection technique: Single-shot  Needle Type: Echogenic Stimulator Needle      Needle Gauge: 21 G    Additional Needles:  Procedures: ultrasound guided and nerve stimulator Popliteal block  Nerve Stimulator or Paresthesia:  Response: Peroneal, 0.4 mA,  Response: Tibial, 0.4 mA,   Additional Responses:   Narrative:  Start time: 06/26/2012 9:20 PM End time: 06/26/2012 9:34 PM Injection made incrementally with aspirations every 5 mL. Anesthesiologist: Sampson Goon, MD  Additional Notes: 2% Lidocaine skin wheel. Saphenous block above the ankle with 10cc of 0.5% Bupivicaine plain.  Popliteal block

## 2012-06-26 NOTE — Transfer of Care (Signed)
Immediate Anesthesia Transfer of Care Note  Patient: [name]  Procedure(s) Performed: Procedure(s) (LRB): OPEN REDUCTION INTERNAL FIXATION (ORIF) ANKLE FRACTURE (Right)  Patient Location: PACU  Anesthesia Type: General and GA combined with regional for post-op pain  Level of Consciousness: sedated, patient cooperative and responds to stimulation  Airway & Oxygen Therapy: Patient Spontanous Breathing and Patient connected to nasal cannula oxygen  Post-op Assessment: Report given to PACU RN, Post -op Vital signs reviewed and stable and Patient moving all extremities  Post vital signs: Reviewed and stable  Complications: No apparent anesthesia complications

## 2012-06-26 NOTE — Anesthesia Preprocedure Evaluation (Addendum)
Anesthesia Evaluation  Patient identified by MRN, date of birth, ID band Patient awake    Reviewed: Allergy & Precautions, H&P , NPO status , Patient's Chart, lab work & pertinent test results, reviewed documented beta blocker date and time   Airway Mallampati: I TM Distance: >3 FB Neck ROM: Full    Dental  (+) Teeth Intact and Dental Advisory Given   Pulmonary shortness of breath, asthma ,          Cardiovascular     Neuro/Psych  Headaches, Depression Bipolar Disorder    GI/Hepatic   Endo/Other    Renal/GU      Musculoskeletal   Abdominal   Peds  Hematology   Anesthesia Other Findings   Reproductive/Obstetrics                           Anesthesia Physical Anesthesia Plan  ASA: III  Anesthesia Plan: General   Post-op Pain Management:    Induction: Intravenous  Airway Management Planned: Oral ETT  Additional Equipment:   Intra-op Plan:   Post-operative Plan:   Informed Consent: I have reviewed the patients History and Physical, chart, labs and discussed the procedure including the risks, benefits and alternatives for the proposed anesthesia with the patient or authorized representative who has indicated his/her understanding and acceptance.   Dental advisory given  Plan Discussed with: CRNA, Anesthesiologist and Surgeon  Anesthesia Plan Comments:         Anesthesia Quick Evaluation

## 2012-06-26 NOTE — H&P (Signed)
[id number]  

## 2012-06-27 MED ORDER — BUDESONIDE-FORMOTEROL FUMARATE 80-4.5 MCG/ACT IN AERO
2.0000 | INHALATION_SPRAY | Freq: Two times a day (BID) | RESPIRATORY_TRACT | Status: DC
  Administered 2012-06-27 – 2012-06-28 (×3): 2 via RESPIRATORY_TRACT
  Filled 2012-06-27: qty 6.9

## 2012-06-27 MED ORDER — ONDANSETRON HCL 4 MG PO TABS: 4.0000 mg | ORAL_TABLET | Freq: Four times a day (QID) | ORAL | Status: DC | PRN

## 2012-06-27 MED ORDER — ENOXAPARIN SODIUM 30 MG/0.3ML ~~LOC~~ SOLN
30.0000 mg | Freq: Two times a day (BID) | SUBCUTANEOUS | Status: DC
  Administered 2012-06-27 – 2012-06-28 (×3): 30 mg via SUBCUTANEOUS
  Filled 2012-06-27 (×5): qty 0.3

## 2012-06-27 MED ORDER — ONDANSETRON HCL 4 MG/2ML IJ SOLN: 4.0000 mg | Freq: Four times a day (QID) | INTRAMUSCULAR | Status: DC | PRN

## 2012-06-27 MED ORDER — METOCLOPRAMIDE HCL 10 MG PO TABS: 5.0000 mg | ORAL_TABLET | Freq: Three times a day (TID) | ORAL | Status: DC | PRN

## 2012-06-27 MED ORDER — WARFARIN VIDEO
Freq: Once | Status: AC
  Administered 2012-06-27: 16:00:00

## 2012-06-27 MED ORDER — PROMETHAZINE HCL 12.5 MG PO TABS: 25.0000 mg | ORAL_TABLET | Freq: Four times a day (QID) | ORAL | Status: DC | PRN

## 2012-06-27 MED ORDER — COUMADIN BOOK
Freq: Once | Status: AC
  Administered 2012-06-27: 08:00:00
  Filled 2012-06-27 (×2): qty 1

## 2012-06-27 MED ORDER — FLUTICASONE PROPIONATE 50 MCG/ACT NA SUSP
2.0000 | Freq: Every day | NASAL | Status: DC
Start: 2012-06-27 — End: 2012-06-28
  Filled 2012-06-27: qty 16

## 2012-06-27 MED ORDER — METHOCARBAMOL 500 MG PO TABS
500.0000 mg | ORAL_TABLET | Freq: Four times a day (QID) | ORAL | Status: DC | PRN
  Administered 2012-06-27: 500 mg via ORAL
  Filled 2012-06-27: qty 1

## 2012-06-27 MED ORDER — WARFARIN - PHARMACIST DOSING INPATIENT: Freq: Every day | Status: DC

## 2012-06-27 MED ORDER — POTASSIUM CHLORIDE IN NACL 20-0.9 MEQ/L-% IV SOLN
INTRAVENOUS | Status: AC
  Administered 2012-06-27: 02:00:00 via INTRAVENOUS
  Filled 2012-06-27: qty 1000

## 2012-06-27 MED ORDER — OXYCODONE-ACETAMINOPHEN 5-325 MG PO TABS: 2.0000 | ORAL_TABLET | ORAL | Status: DC | PRN

## 2012-06-27 MED ORDER — OXYCODONE HCL 5 MG PO TABS: 5.0000 mg | ORAL_TABLET | ORAL | Status: AC | PRN

## 2012-06-27 MED ORDER — METHOCARBAMOL 500 MG PO TABS: 500.0000 mg | ORAL_TABLET | Freq: Four times a day (QID) | ORAL | Status: AC | PRN

## 2012-06-27 MED ORDER — OXYCODONE HCL 5 MG PO TABS
5.0000 mg | ORAL_TABLET | ORAL | Status: DC | PRN
  Administered 2012-06-27: 10 mg via ORAL
  Filled 2012-06-27: qty 2

## 2012-06-27 MED ORDER — CEFAZOLIN SODIUM 1-5 GM-% IV SOLN
1.0000 g | Freq: Three times a day (TID) | INTRAVENOUS | Status: AC
  Administered 2012-06-27 (×3): 1 g via INTRAVENOUS
  Filled 2012-06-27 (×4): qty 50

## 2012-06-27 MED ORDER — ALBUTEROL SULFATE HFA 108 (90 BASE) MCG/ACT IN AERS
2.0000 | INHALATION_SPRAY | Freq: Four times a day (QID) | RESPIRATORY_TRACT | Status: DC | PRN
  Filled 2012-06-27: qty 6.7

## 2012-06-27 MED ORDER — LITHIUM CARBONATE 300 MG PO CAPS
600.0000 mg | ORAL_CAPSULE | Freq: Two times a day (BID) | ORAL | Status: DC
  Administered 2012-06-27 – 2012-06-28 (×3): 600 mg via ORAL
  Filled 2012-06-27 (×5): qty 2

## 2012-06-27 MED ORDER — PROMETHAZINE HCL 25 MG PO TABS: 25.0000 mg | ORAL_TABLET | Freq: Four times a day (QID) | ORAL | Status: DC | PRN

## 2012-06-27 MED ORDER — METHOCARBAMOL 100 MG/ML IJ SOLN
500.0000 mg | Freq: Four times a day (QID) | INTRAVENOUS | Status: DC | PRN
  Filled 2012-06-27: qty 5

## 2012-06-27 MED ORDER — RISPERIDONE 1 MG PO TABS
1.0000 mg | ORAL_TABLET | Freq: Every day | ORAL | Status: DC
  Administered 2012-06-27 – 2012-06-28 (×2): 1 mg via ORAL
  Filled 2012-06-27 (×2): qty 1

## 2012-06-27 MED ORDER — FLUOXETINE HCL 20 MG PO CAPS
20.0000 mg | ORAL_CAPSULE | Freq: Every day | ORAL | Status: DC
  Administered 2012-06-27 – 2012-06-28 (×2): 20 mg via ORAL
  Filled 2012-06-27 (×2): qty 1

## 2012-06-27 MED ORDER — WARFARIN SODIUM 7.5 MG PO TABS
7.5000 mg | ORAL_TABLET | Freq: Once | ORAL | Status: AC
  Administered 2012-06-27: 7.5 mg via ORAL
  Filled 2012-06-27: qty 1

## 2012-06-27 MED ORDER — HYDROMORPHONE HCL PF 1 MG/ML IJ SOLN: 0.5000 mg | INTRAMUSCULAR | Status: DC | PRN

## 2012-06-27 MED ORDER — METOCLOPRAMIDE HCL 5 MG/ML IJ SOLN
5.0000 mg | Freq: Three times a day (TID) | INTRAMUSCULAR | Status: DC | PRN
  Filled 2012-06-27: qty 2

## 2012-06-27 MED ORDER — ENOXAPARIN SODIUM 30 MG/0.3ML ~~LOC~~ SOLN: 30.0000 mg | Freq: Two times a day (BID) | SUBCUTANEOUS | Status: DC

## 2012-06-27 NOTE — Progress Notes (Signed)
PT TREATMENT   06/27/12 1400  PT Visit Information  Last PT Received On 06/27/12  Assistance Needed +1  PT Time Calculation  PT Start Time 1404  PT Stop Time 1416  PT Time Calculation (min) 12 min  Restrictions  Weight Bearing Restrictions Yes  RLE Weight Bearing NWB  Cognition  Overall Cognitive Status Appears within functional limits for tasks assessed/performed  Arousal/Alertness Awake/alert  Orientation Level Appears intact for tasks assessed  Behavior During Session [facility] for tasks performed  Bed Mobility  Bed Mobility Not assessed  Transfers  Transfers Sit to Stand;Stand to Sit  Sit to Stand 4: Min guard;With upper extremity assist;From bed;From chair/3-in-1  Stand to Sit 4: Min guard;With upper extremity assist;To chair/3-in-1  Details for Transfer Assistance VC for safe hand placement. Pt able to control ascent/descent  Ambulation/Gait  Ambulation/Gait Assistance 5: Supervision  Ambulation Distance (Feet) 10 Feet  Assistive device Rolling walker  Ambulation/Gait Assistance Details VC for safety throughout proper sequencing.  Gait Pattern Step-to pattern;Decreased step length - left  Stairs Yes  Stairs Assistance 4: Min guard  Stairs Assistance Details (indicate cue type and reason) Educated pt and husband on proper and safe technique. Pt prefers using chair with R knee to get up 1 step and then the 1 step to get into the house, she will go backwards into and sit on a chair.  Stair Management Technique Other (comment) (see above)  Number of Stairs 1   PT - End of Session  Equipment Utilized During Treatment Gait belt  Activity Tolerance Patient tolerated treatment well  Patient left in chair;with call bell/phone within reach;with family/visitor present  Nurse Communication Mobility status  PT - Assessment/Plan  Comments on Treatment Session Session completed with pt and husband for safe maneuvering of stair. Practiced two ways, pt safe and comfortable to perform if d/c  today or tomorrow morning prior to PT session  PT Plan Discharge plan remains appropriate;Frequency remains appropriate  PT Frequency Min 5X/week  Follow Up Recommendations No PT follow up;Supervision for mobility/OOB  Equipment Recommended Rolling walker with 5" wheels;Tub/shower bench  Acute Rehab PT Goals  PT Goal: Sit to Stand - Progress Progressing toward goal  PT Goal: Stand to Sit - Progress Progressing toward goal  PT Transfer Goal: Bed to Chair/Chair to Bed - Progress Progressing toward goal  PT Goal: Ambulate - Progress Progressing toward goal  PT Goal: Up/Down Stairs - Progress Progressing toward goal    06/27/2012 Milana Kidney DPT PAGER: [phone] OFFICE: [phone]

## 2012-06-27 NOTE — Progress Notes (Signed)
Occupational Therapy Evaluation Patient Details Name: [name] MRN: [id number] DOB: Aug 25, 1961 Today's Date: 06/27/2012 Time: 4098-1191 OT Time Calculation (min): 47 min  OT Assessment / Plan / Recommendation Clinical Impression  Pt s/p ORIF of right ankle fracture thus affecting PLOF.  Pt able to perform ADLs and functional transfers at min guard/supervision level.  Recommending tub transfer bench.  All education completed and no further OT services needed. Signing off.    OT Assessment  Patient does not need any further OT services    Follow Up Recommendations  No OT follow up;Supervision/Assistance - 24 hour    Barriers to Discharge      Equipment Recommendations  Rolling walker with 5" wheels;Tub/shower bench    Recommendations for Other Services    Frequency       Precautions / Restrictions Restrictions Weight Bearing Restrictions: Yes RLE Weight Bearing: Non weight bearing   Pertinent Vitals/Pain See vitals    ADL  Grooming: Performed;Wash/dry hands;Teeth care;Supervision/safety Where Assessed - Grooming: Supported standing Upper Body Dressing: Performed;Supervision/safety (donned gown on back) Where Assessed - Upper Body Dressing: Supported standing Toilet Transfer: Simulated;Min Pension scheme manager Method: Sit to Barista: Other (comment) (EOB to chair) Tub/Shower Transfer: Performed;Min guard Tub/Shower Transfer Method: Science writer: Counsellor Used: Gait belt;Rolling walker Transfers/Ambulation Related to ADLs: Supervision with RW and intermittent min guard for safety with turns ADL Comments: Educated pt on threading R LE through clothing during LB dressing tasks.  Educated and demonstrated tub transfer with use of tub transfer bench and pt able safely return demonstration.    OT Diagnosis:    OT Problem List:   OT Treatment Interventions:     OT Goals    Visit Information  Last OT Received On: 06/27/12 Assistance Needed: +1    Subjective Data      Prior Functioning  Vision/Perception  Home Living Lives With: Spouse Available Help at Discharge: Family;Available 24 hours/day Type of Home: Apartment Home Access: Stairs to enter Entrance Stairs-Number of Steps: 1 Entrance Stairs-Rails: None Home Layout: One level Bathroom Shower/Tub: Forensic scientist: Standard Bathroom Accessibility: Yes How Accessible: Accessible via walker Home Adaptive Equipment: Crutches Prior Function Level of Independence: Independent Able to Take Stairs?: Yes Driving: Yes Vocation: Student Communication Communication: No difficulties Dominant Hand: Right      Cognition  Overall Cognitive Status: Appears within functional limits for tasks assessed/performed Arousal/Alertness: Awake/alert Orientation Level: Appears intact for tasks assessed Behavior During Session: [facility] for tasks performed    Extremity/Trunk Assessment Right Upper Extremity Assessment RUE ROM/Strength/Tone: Within functional levels Left Upper Extremity Assessment LUE ROM/Strength/Tone: Within functional levels   Mobility Bed Mobility Bed Mobility: Supine to Sit;Sitting - Scoot to Edge of Bed Supine to Sit: 7: Independent Sitting - Scoot to Edge of Bed: 7: Independent Transfers Sit to Stand: 4: Min guard;With upper extremity assist;From bed;From chair/3-in-1;Other (comment) (from tub transfer bench) Stand to Sit: 4: Min guard;With upper extremity assist;To chair/3-in-1;With armrests (to tub transfer bench) Details for Transfer Assistance: VC for hand placement and proper sequencing for safety and comfort during transfer   Exercise    Balance    End of Session OT - End of Session Equipment Utilized During Treatment: Gait belt Activity Tolerance: Patient tolerated treatment well Patient left: in chair;with call bell/phone within reach;with family/visitor present Nurse  Communication: Mobility status  GO   06/27/2012 Cipriano Mile OTR/L Pager [phone] Office [phone]   Cipriano Mile 06/27/2012, 4:06 PM

## 2012-06-27 NOTE — Progress Notes (Signed)
ANTICOAGULATION CONSULT NOTE - Initial Consult  Pharmacy Consult for Coumadin Indication: VTE prophylaxis  Allergies  Allergen Reactions  . Gabapentin Swelling  . Codeine     REACTION: lethargic  . Lamotrigine     REACTION: lips swell  . Shellfish Allergy     Some shellfish- is able to eat shrimp but not mussels, clams   . Topiramate     REACTION: hallucinations    Patient Measurements: Height: 5\' 5"  (165.1 cm) Weight: 248 lb (112.492 kg) IBW/kg (Calculated) : 57   Vital Signs: Temp: 98.2 F (36.8 C) (07/19 0005) Temp src: Oral (07/18 1624) BP: 128/72 mmHg (07/19 0005) Pulse Rate: 84  (07/19 0005)  Labs:  Basename 06/26/12 1645  HGB 12.9  HCT 39.1  PLT 198  APTT 27  LABPROT 13.2  INR 0.98  HEPARINUNFRC --  CREATININE 0.83  CKTOTAL --  CKMB --  TROPONINI --    Estimated Creatinine Clearance: 102.5 ml/min (by C-G formula based on Cr of 0.83).   Medical History: Past Medical History  Diagnosis Date  . Asthma   . Depression   . Bipolar affective disorder, depressed, mild     Remote history of suicidal attempts  . Breast cancer     In 2005 treated with surgery and Arimidex for 5 years  . History of pulmonary embolism     Bilateral in 2001  . Bipolar 1 disorder   . Anginal pain     11/2011  . Shortness of breath     in the past  . Headache     h/o migraines   . HNP (herniated nucleus pulposus)     2000- L4-5, used pain mgt. prog. in [place]    Medications:  Prescriptions prior to admission  Medication Sig Dispense Refill  . albuterol (PROVENTIL HFA;VENTOLIN HFA) 108 (90 BASE) MCG/ACT inhaler Inhale 2 puffs into the lungs every 6 (six) hours as needed. sob       . budesonide-formoterol (SYMBICORT) 80-4.5 MCG/ACT inhaler Inhale 2 puffs into the lungs 2 (two) times daily.      Marland Kitchen FLUoxetine (PROZAC) 20 MG capsule Take 20 mg by mouth daily.        Marland Kitchen lithium 600 MG capsule Take 600 mg by mouth 2 (two) times daily with a meal.       .  oxyCODONE-acetaminophen (PERCOCET/ROXICET) 5-325 MG per tablet Take 1 tablet by mouth every 4 (four) hours as needed for pain.  30 tablet  0  . risperiDONE (RISPERDAL) 1 MG tablet Take 1 mg by mouth daily.        . fluticasone (FLONASE) 50 MCG/ACT nasal spray Place 2 sprays into the nose daily.  16 g  2  . promethazine (PHENERGAN) 12.5 MG tablet Take 2 tablets (25 mg total) by mouth every 6 (six) hours as needed for nausea.  20 tablet  0   Scheduled:    . budesonide-formoterol  2 puff Inhalation BID  .  ceFAZolin (ANCEF) IV  3 g Intravenous 60 min Pre-Op  .  ceFAZolin (ANCEF) IV  1 g Intravenous Q8H  . coumadin book   Does not apply Once  . enoxaparin (LOVENOX) injection  30 mg Subcutaneous Q12H  . FLUoxetine  20 mg Oral Daily  . fluticasone  2 spray Each Nare Daily  . lithium  600 mg Oral BID WC  . risperiDONE  1 mg Oral Daily  . warfarin  7.5 mg Oral ONCE-1800  . warfarin   Does not apply Once  .  Warfarin - Pharmacist Dosing Inpatient   Does not apply q1800  . DISCONTD: chlorhexidine  60 mL Topical Once  . DISCONTD: chlorhexidine  60 mL Topical Once  . DISCONTD: oxyCODONE-acetaminophen  1 tablet Oral Once    Assessment: 51yo female s/p ORIF for R ankle Fx due to fall to begin Coumadin for VTE Px.  Goal of Therapy:  INR 2-3   Plan:  Will give Coumadin 7.5mg  x1 today and monitor INR for dose adjustments; begin Coumadin education.  Colleen Can PharmD BCPS 06/27/2012,12:42 AM

## 2012-06-27 NOTE — Care Management Note (Signed)
  Page 1 of 1   06/27/2012     2:21:36 PM   CARE MANAGEMENT NOTE 06/27/2012  Patient:  MILAGROS, MIDDENDORF   Account Number:  [id number]  Date Initiated:  06/27/2012  Documentation initiated by:  Ronny Flurry  Subjective/Objective Assessment:   s/p Right ankle displaced lateral malleolus fracture open  reduction, internal fixation.     Action/Plan:     lives with husband . Has 24 hour support / care from family . No recommendations for home health   Anticipated DC Date:  06/27/2012   Anticipated DC Plan:  HOME/SELF CARE      DC Planning Services  CM consult      Choice offered to / List presented to:     DME arranged  Levan Hurst      DME agency  [facility]        Status of service:   Medicare Important Message given?   (If response is "NO", the following Medicare IM given date fields will be blank) Date Medicare IM given:   Date Additional Medicare IM given:    Discharge Disposition:    Per UR Regulation:    If discussed at Long Length of Stay Meetings, dates discussed:    Comments:    06-27-12 Patient set up with rolling walker and tub transfer bench. No recommendations for home health. Ronny Flurry RN BSN [phone]

## 2012-06-27 NOTE — H&P (Signed)
[name], [name]              ACCOUNT NO.:  [id number]  MEDICAL RECORD NO.:  [id number]  LOCATION:  6N12C                        FACILITY:  [facility]  PHYSICIAN:  Burnard Bunting, M.D.    DATE OF BIRTH:  1961/07/14  DATE OF ADMISSION:  06/26/2012 DATE OF DISCHARGE:                             HISTORY & PHYSICAL   CHIEF COMPLAINT:  Right ankle pain.  HISTORY OF PRESENT ILLNESS:  [name] is a 51 year old female who injured her right ankle yesterday.  She sustained a fall and twisted her ankle.  She also hurt her left ankle which she has been able to weight bear on the left ankle.  She was seen at [facility]. Radiographs were obtained, showed displaced ankle fracture.  She presents now for further management.  CURRENT MEDICATIONS:  Lithium, Prozac, Risperdal, aspirin, and inhaler.  ALLERGIES:  She is allergic to codeine, Neurontin, and Lamictal.  Had a lumpectomy in 2005, hysterectomy in 2005.  Notably, she does have a history of deep vein thrombosis and pulmonary embolism about 10 years ago.  FAMILY MEDICAL HISTORY:  Negative for DVT.  It is positive for diabetes, cancer, high blood pressure.  She is married but a Consulting civil engineer.  Currently does have a 20 pack-year smoking history.  REVIEW OF SYSTEMS:  All systems were reviewed and are negative except they relate to right ankle.  PHYSICAL EXAMINATION:  GENERAL:  She is well-developed, well-nourished, in no acute distress.  Alert and oriented.  Normal body mass index.  She has normal alignment.  She has swelling and tenderness along the medial and lateral aspect of the ankle.  Pedal pulse is palpable.  Compartments soft.  She has an abrasion over the anterior aspect of the right knee, a little bit of tenderness around the ATFL and CFL on the left ankle.  Radiographs showed displaced lateral malleolus fracture with widening of the mortise.  IMPRESSION:  Displaced ankle fracture, widening of the  mortise, particularly between the distal tib-fib area.  This is in comparison to the left ankle.  PLAN:  Open reduction and internal fixation of ankle fracture with syndesmotic fixation.  Risks and benefits discussed with the patient, but not limited to infection, deep vein thrombosis, need for subsequent hardware removal.  Also because of her history, she will need to be placed on both Lovenox bridging and Coumadin for DVT prophylaxis.  We will likely increase her hospital stay to at least 2 to possibly 3 days. The patient will be nonweightbearing for a period of 2 months and we will try to get her ankle moving as soon as possible.  Risks and benefits of the surgical __________ all questions answered.  Medical complexity increased today by her coexisting medical comorbidities and the decision for surgery. All questions were answered.     Burnard Bunting, M.D.     GSD/MEDQ  D:  06/26/2012  T:  06/27/2012  Job:  [id number]

## 2012-06-27 NOTE — Evaluation (Signed)
Physical Therapy Evaluation Patient Details Name: [name] MRN: [id number] DOB: Oct 26, 1961 Today's Date: 06/27/2012 Time: 9147-8295 PT Time Calculation (min): 44 min  PT Assessment / Plan / Recommendation Clinical Impression  Pt s/p ORIF of right ankle fracture. Pt with good functional mobility, although still difficulty with one stair. Pt will benefit from skilled PT in the acute care setting in order to maximize functional mobility prior to d/c    PT Assessment  Patient needs continued PT services    Follow Up Recommendations  No PT follow up;Supervision for mobility/OOB       Equipment Recommendations  Rolling walker with 5" wheels       Frequency Min 5X/week    Precautions / Restrictions Restrictions Weight Bearing Restrictions: Yes RLE Weight Bearing: Non weight bearing         Mobility  Bed Mobility Bed Mobility: Supine to Sit;Sitting - Scoot to Edge of Bed Supine to Sit: 7: Independent Sitting - Scoot to Edge of Bed: 7: Independent Transfers Transfers: Sit to Stand;Stand to Sit Sit to Stand: 4: Min guard;With upper extremity assist;From bed;From chair/3-in-1 Stand to Sit: 4: Min guard;With upper extremity assist;To chair/3-in-1 Details for Transfer Assistance: VC for hand placement and proper sequencing for safety and comfort during transfer Ambulation/Gait Ambulation/Gait Assistance: 4: Min guard Ambulation Distance (Feet): 20 Feet (multiple times) Assistive device: Rolling walker Ambulation/Gait Assistance Details: VC for proper sequencing and technique to maintain NWB using RW. Pt with good LLE strength to hop using RW Gait Pattern: Step-to pattern;Decreased step length - left Stairs: Yes Stairs Assistance: 3: Mod assist;2: Max assist Stairs Assistance Details (indicate cue type and reason): VC for proper sequencing. Attempted backwards stair with RW, pt unable to lift weight to take step backwards. Attempted stairs using chair next to step and using R  knee and then stepping onto step, pt felt more comfortable. Will attempt this method as well as backwards to chair on top of curb with husband.  Stair Management Technique: Other (comment) (see above)    Exercises     PT Diagnosis: Acute pain  PT Problem List: Decreased strength;Decreased activity tolerance;Decreased mobility;Decreased knowledge of use of DME;Decreased safety awareness;Decreased knowledge of precautions;Pain PT Treatment Interventions: DME instruction;Gait training;Stair training;Functional mobility training;Therapeutic activities;Therapeutic exercise;Patient/family education   PT Goals Acute Rehab PT Goals PT Goal Formulation: With patient Time For Goal Achievement: 07/04/12 Potential to Achieve Goals: Fair Pt will go Sit to Stand: with modified independence PT Goal: Sit to Stand - Progress: Goal set today Pt will go Stand to Sit: with modified independence PT Goal: Stand to Sit - Progress: Goal set today Pt will Transfer Bed to Chair/Chair to Bed: with modified independence PT Transfer Goal: Bed to Chair/Chair to Bed - Progress: Goal set today Pt will Ambulate: with modified independence;51 - 150 feet;with least restrictive assistive device PT Goal: Ambulate - Progress: Goal set today Pt will Go Up / Down Stairs: 1-2 stairs;with min assist;with least restrictive assistive device PT Goal: Up/Down Stairs - Progress: Goal set today  Visit Information  Last PT Received On: 06/27/12 Assistance Needed: +1 PT/OT Co-Evaluation/Treatment: Yes    Subjective Data      Prior Functioning  Home Living Lives With: Spouse Available Help at Discharge: Family;Available 24 hours/day Type of Home: Apartment Home Access: Stairs to enter Entrance Stairs-Number of Steps: 1 Entrance Stairs-Rails: None Home Layout: One level Bathroom Shower/Tub: Forensic scientist: Standard Bathroom Accessibility: Yes How Accessible: Accessible via walker Home Adaptive  Equipment: Crutches  Prior Function Level of Independence: Independent Able to Take Stairs?: Yes Driving: Yes Vocation: Student Comments: back to school in 30 days Communication Communication: No difficulties Dominant Hand: Right    Cognition  Overall Cognitive Status: Appears within functional limits for tasks assessed/performed Arousal/Alertness: Awake/alert Orientation Level: Appears intact for tasks assessed Behavior During Session: [facility] for tasks performed    Extremity/Trunk Assessment Right Lower Extremity Assessment RLE ROM/Strength/Tone: Unable to fully assess;Due to precautions;Due to pain RLE Sensation: WFL - Light Touch Left Lower Extremity Assessment LLE ROM/Strength/Tone: Within functional levels LLE Sensation: WFL - Light Touch   Balance    End of Session PT - End of Session Equipment Utilized During Treatment: Gait belt Activity Tolerance: Patient tolerated treatment well Patient left: in chair;with call bell/phone within reach;with family/visitor present Nurse Communication: Mobility status   Milana Kidney 06/27/2012, 1:41 PM  06/27/2012 Milana Kidney DPT PAGER: [phone] OFFICE: [phone]

## 2012-06-27 NOTE — Progress Notes (Signed)
Subjective: Pt stable block in effect   Objective: Vital signs in last 24 hours: Temp:  [97.5 F (36.4 C)-98.9 F (37.2 C)] 97.6 F (36.4 C) (07/19 1315) Pulse Rate:  [77-89] 84  (07/19 1315) Resp:  [18-20] 18  (07/19 1315) BP: (103-135)/(53-87) 112/71 mmHg (07/19 1315) SpO2:  [95 %-100 %] 98 % (07/19 1315) Weight:  [112.492 kg (248 lb)] 112.492 kg (248 lb) (07/18 1627)  Intake/Output from previous day: 07/18 0701 - 07/19 0700 In: 2985 [P.O.:120; I.V.:2815; IV Piggyback:50] Out: 1500 [Urine:1500] Intake/Output this shift: Total I/O In: 360 [P.O.:360] Out: 200 [Urine:200]  Exam:  Intact pulses distally  Labs:  Basename 06/26/12 1645  HGB 12.9    Basename 06/26/12 1645  WBC 7.0  RBC 4.56  HCT 39.1  PLT 198    Basename 06/26/12 1645  NA 135  K 4.2  CL 100  CO2 25  BUN 10  CREATININE 0.83  GLUCOSE 77  CALCIUM 9.8    Basename 06/26/12 1645  LABPT --  INR 0.98    Assessment/Plan: Pt stable - block in effect - will plan on dc am after block worn off - rx on chart   [name] 06/27/2012, 4:08 PM

## 2012-06-27 NOTE — Op Note (Signed)
[name], [name] NO.:  [id number]  MEDICAL RECORD NO.:  [id number]  LOCATION:  6N12C                        FACILITY:  [facility]  PHYSICIAN:  Burnard Bunting, M.D.    DATE OF BIRTH:  [birth year]  DATE OF PROCEDURE: DATE OF DISCHARGE:                              OPERATIVE REPORT   PREOPERATIVE DIAGNOSIS:  Right ankle displaced lateral malleolus fracture.  POSTOPERATIVE DIAGNOSIS:  Right ankle displaced lateral malleolus fracture.  PROCEDURE:  Right ankle displaced lateral malleolus fracture open reduction, internal fixation.  SURGEON:  Burnard Bunting, M.D.  ASSISTANT:  None.  ANESTHESIA:  General endotracheal plus femoral popliteal nerve block.  INDICATIONS:  [name] is a patient who fell and sustained a displaced ankle fracture, presents now for operative management after explanation of risks and benefits.  PROCEDURE IN DETAIL:  The patient was brought to the operating room where general endotracheal anesthesia was induced.  Preoperative antibiotics were administered.  Time-out was called.  Right ankle was prescrubbed with alcohol, Betadine, which allowed to air dry, prepped with DuraPrep solution and draped in a sterile manner.  Collier Flowers was used to cover the operative field.  Leg was elevated and exsanguinated via Esmarch wrap around the ankle for an ankle Esmarch for approximately 25 minutes.  Lateral incision was made over the lateral malleolus.  Skin and subcutaneous tissue were sharply divided.  Care was taken to avoid injury to superficial peroneal nerve.  Fracture was identified and reduced.  Lag screw was placed.  Lateral plate was placed.  Anatomic reduction was achieved.  Fluoroscopy was used to confirm that the syndesmosis was stable.  Tourniquet was released.  Bleeding points controlled with electrocautery.  Irrigation was performed.  Incision was closed using 2-0 Vicryl, 3-0 Vicryl, and 3-0 nylon.  Bulky well-padded posterior splint  was applied.  The patient tolerated the procedure well without immediate complications.     Burnard Bunting, M.D.     GSD/MEDQ  D:  06/26/2012  T:  06/27/2012  Job:  [id number]

## 2012-06-28 LAB — PROTIME-INR: Prothrombin Time: 13.7 seconds (ref 11.6–15.2)

## 2012-06-28 MED ORDER — WARFARIN SODIUM 7.5 MG PO TABS: 7.5000 mg | ORAL_TABLET | Freq: Once | ORAL | Status: DC

## 2012-06-28 MED ORDER — WARFARIN SODIUM 7.5 MG PO TABS
7.5000 mg | ORAL_TABLET | Freq: Once | ORAL | Status: DC
  Filled 2012-06-28: qty 1

## 2012-06-28 NOTE — Progress Notes (Signed)
ANTICOAGULATION CONSULT NOTE - Follow Up Consult  Pharmacy Consult for Coumadin Indication: VTE prophylaxis  Allergies  Allergen Reactions  . Gabapentin Swelling  . Codeine     REACTION: lethargic  . Lamotrigine     REACTION: lips swell  . Shellfish Allergy     Some shellfish- is able to eat shrimp but not mussels, clams   . Topiramate     REACTION: hallucinations    Patient Measurements: Height: 5\' 5"  (165.1 cm) Weight: 248 lb (112.492 kg) IBW/kg (Calculated) : 57   Vital Signs: Temp: 97.6 F (36.4 C) (07/20 0954) Temp src: Oral (07/20 0954) BP: 119/70 mmHg (07/20 0954) Pulse Rate: 81  (07/20 0954)  Labs:  Basename 06/28/12 0715 06/26/12 1645  HGB -- 12.9  HCT -- 39.1  PLT -- 198  APTT -- 27  LABPROT 13.7 13.2  INR 1.03 0.98  HEPARINUNFRC -- --  CREATININE -- 0.83  CKTOTAL -- --  CKMB -- --  TROPONINI -- --    Estimated Creatinine Clearance: 102.5 ml/min (by C-G formula based on Cr of 0.83).   Assessment: 51yo female s/p ORIF for R ankle Fx due to fall to continue Coumadin for VTE Px. Noted INR is subtherapeutic, as expected s/p first Coumadin dose last PM. No bleeding reported.  Goal of Therapy:  INR 2-3 Monitor platelets by anticoagulation protocol: Yes   Plan:  - Coumadin 7.5mg  PO x 1 today - Will f/up daily INR - Will f/up to complete Coumadin education  Jackelyne Sayer K. Allena Katz, PharmD, BCPS.  Clinical Pharmacist Pager [phone]. 06/28/2012 10:43 AM

## 2012-06-28 NOTE — Progress Notes (Signed)
Pt ready to go home.  She will continue taking  Coumadin and Lovenox at home.  Went over [facility] administration of Lovenox with her.  She stated she is very comfortable with it since she has done it in the past.  Very familiar with Coumadin also.  Advised to still read the Coumadin book and follow up with her primary for INR checks and dosing.

## 2012-06-30 ENCOUNTER — Encounter (HOSPITAL_COMMUNITY): Payer: Self-pay | Admitting: Orthopedic Surgery

## 2012-07-03 ENCOUNTER — Telehealth: Payer: Self-pay | Admitting: *Deleted

## 2012-07-03 NOTE — Telephone Encounter (Signed)
patient confirmed over the phone the new date and time on 12-17-2012 at 2:30pm

## 2012-07-17 ENCOUNTER — Ambulatory Visit: Payer: BC Managed Care – PPO

## 2012-07-28 ENCOUNTER — Ambulatory Visit: Payer: BC Managed Care – PPO

## 2012-07-28 NOTE — Discharge Summary (Signed)
Physician Discharge Summary  Patient ID: [name] MRN: [id number] DOB/AGE: 1961-11-12 51 y.o.  Admit date: 06/26/2012 Discharge date: 06/29/2012 Admission Diagnoses:  Ankle fracture  Discharge Diagnoses:  Same  Surgeries: Procedure(s): OPEN REDUCTION INTERNAL FIXATION (ORIF) ANKLE FRACTURE on 06/26/2012 - 06/27/2012   Consultants:    Discharged Condition: Stable  Hospital Course: MYSTIC LABO is an 51 y.o. female who was admitted 06/26/2012 with a chief complaint of ankle pain, and found to have a diagnosis of ankle fracture  They were brought to the operating room on 06/26/2012 - 06/27/2012 and underwent the above named procedures.She was started on dvt prophylaxis post op and mobilized with PT before dc.    Antibiotics given:  Anti-infectives     Start     Dose/Rate Route Frequency Ordered Stop   06/27/12 0015   ceFAZolin (ANCEF) IVPB 1 g/50 mL premix        1 g 100 mL/hr over 30 Minutes Intravenous 3 times per day 06/27/12 0016 06/27/12 2118   06/26/12 1631   ceFAZolin (ANCEF) 3 g in dextrose 5 % 50 mL IVPB        3 g 160 mL/hr over 30 Minutes Intravenous 60 min pre-op 06/26/12 1631 06/26/12 2202        .  Recent vital signs:  Filed Vitals:   06/28/12 0954  BP: 119/70  Pulse: 81  Temp: 97.6 F (36.4 C)  Resp: 20    Recent laboratory studies:  Results for orders placed during the hospital encounter of 06/26/12  APTT      Component Value Range   aPTT 27  24 - 37 seconds  BASIC METABOLIC PANEL      Component Value Range   Sodium 135  135 - 145 mEq/L   Potassium 4.2  3.5 - 5.1 mEq/L   Chloride 100  96 - 112 mEq/L   CO2 25  19 - 32 mEq/L   Glucose, Bld 77  70 - 99 mg/dL   BUN 10  6 - 23 mg/dL   Creatinine, Ser 7.82  0.50 - 1.10 mg/dL   Calcium 9.8  8.4 - 95.6 mg/dL   GFR calc non Af Amer 81 (*) >90 mL/min   GFR calc Af Amer >90  >90 mL/min  CBC      Component Value Range   WBC 7.0  4.0 - 10.5 K/uL   RBC 4.56  3.87 - 5.11 MIL/uL   Hemoglobin 12.9   12.0 - 15.0 g/dL   HCT 21.3  08.6 - 57.8 %   MCV 85.7  78.0 - 100.0 fL   MCH 28.3  26.0 - 34.0 pg   MCHC 33.0  30.0 - 36.0 g/dL   RDW 46.9  62.9 - 52.8 %   Platelets 198  150 - 400 K/uL  PROTIME-INR      Component Value Range   Prothrombin Time 13.2  11.6 - 15.2 seconds   INR 0.98  0.00 - 1.49  SURGICAL PCR SCREEN      Component Value Range   MRSA, PCR NEGATIVE  NEGATIVE   Staphylococcus aureus NEGATIVE  NEGATIVE  PROTIME-INR      Component Value Range   Prothrombin Time 13.7  11.6 - 15.2 seconds   INR 1.03  0.00 - 1.49    Discharge Medications:   Medication List  As of 07/28/2012  6:54 PM   STOP taking these medications         oxyCODONE-acetaminophen 5-325 MG per tablet  TAKE these medications         albuterol 108 (90 BASE) MCG/ACT inhaler   Commonly known as: PROVENTIL HFA;VENTOLIN HFA   Inhale 2 puffs into the lungs every 6 (six) hours as needed. sob      budesonide-formoterol 80-4.5 MCG/ACT inhaler   Commonly known as: SYMBICORT   Inhale 2 puffs into the lungs 2 (two) times daily.      enoxaparin 30 MG/0.3ML injection   Commonly known as: LOVENOX   Inject 0.3 mLs (30 mg total) into the skin every 12 (twelve) hours.      FLUoxetine 20 MG capsule   Commonly known as: PROZAC   Take 20 mg by mouth daily.      fluticasone 50 MCG/ACT nasal spray   Commonly known as: FLONASE   Place 2 sprays into the nose daily.      lithium 600 MG capsule   Take 600 mg by mouth 2 (two) times daily with a meal.      promethazine 12.5 MG tablet   Commonly known as: PHENERGAN   Take 2 tablets (25 mg total) by mouth every 6 (six) hours as needed for nausea.      risperiDONE 1 MG tablet   Commonly known as: RISPERDAL   Take 1 mg by mouth daily.      warfarin 7.5 MG tablet   Commonly known as: COUMADIN   Take 1 tablet (7.5 mg total) by mouth one time only at 6 PM.            Diagnostic Studies: No results found.  Disposition: 01-Home or Self Care  Discharge  Orders    Future Appointments: Provider: Department: Dept Phone: Center:   08/06/2012 4:40 PM [facility] [phone] [facility]   08/27/2012 4:30 PM Kalman Shan, MD [facility] [phone] None   12/17/2012 2:30 PM Victorino December, MD [facility] [phone] None     Future Orders Please Complete By Expires   Diet - low sodium heart healthy      Call MD / Call 911      Comments:   If you experience chest pain or shortness of breath, CALL 911 and be transported to the hospital emergency room.  If you develope a fever above 101 F, pus (white drainage) or increased drainage or redness at the wound, or calf pain, call your surgeon's office.   Constipation Prevention      Comments:   Drink plenty of fluids.  Prune juice may be helpful.  You may use a stool softener, such as Colace (over the counter) 100 mg twice a day.  Use MiraLax (over the counter) for constipation as needed.   Increase activity slowly as tolerated      Discharge instructions      Comments:   1. Non weight bearing right leg. 2. Keep leg elevated. 3. Continue coumadin and lovenox until INR 2.5 or greater 4. Follow up next Friday         Signed: Jordanne Elsbury SCOTT 07/28/2012, 6:54 PM

## 2012-08-06 ENCOUNTER — Ambulatory Visit: Payer: BC Managed Care – PPO

## 2012-08-25 ENCOUNTER — Ambulatory Visit
Admission: RE | Admit: 2012-08-25 | Discharge: 2012-08-25 | Disposition: A | Discharge status: Home / Self Care | Payer: BC Managed Care – PPO | Source: Ambulatory Visit | Attending: Internal Medicine | Admitting: Internal Medicine

## 2012-08-25 DIAGNOSIS — Z1231 Encounter for screening mammogram for malignant neoplasm of breast: Secondary | ICD-10-CM

## 2012-08-27 ENCOUNTER — Ambulatory Visit (INDEPENDENT_AMBULATORY_CARE_PROVIDER_SITE_OTHER): Payer: 59 | Admitting: Internal Medicine

## 2012-08-27 ENCOUNTER — Encounter: Payer: Self-pay | Admitting: Internal Medicine

## 2012-08-27 VITALS — BP 122/86 | HR 79 | Temp 98.4°F | Ht 65.0 in | Wt 262.2 lb

## 2012-08-27 DIAGNOSIS — Z23 Encounter for immunization: Secondary | ICD-10-CM

## 2012-08-27 DIAGNOSIS — J45909 Unspecified asthma, uncomplicated: Secondary | ICD-10-CM

## 2012-08-27 NOTE — Patient Instructions (Addendum)
#  asthma   - glad this is stable  - continue symbicort 80/4.5 2 puff twice daily -  - take albuterol 2 puff as needed - flu shot today 08/27/2012   #Weight management  - we discussed extensively about weight management   - follow low glycemic diet plan that I outlined for you after extensive discussion  - For breakfast   - recommend steel cut oat meal or fiber one 60 cal (half to one cup) with low sugar 60 cal silk soymilk and some berries and one egg  - snack with berries or low glycemic fruit or mens health 35gm mixed-nut pack  - lunch and dinner - unlimited non-starchy vegetable (prefer raw fresh or roasted or grilled) with skinless chicken or fish   - drink lot of water  - avoid all moderate and high glycemic foods  - measure weight once  A week  - download myfitness pal app onto your phone and set weight loss goal of 1-2 pounds per month  #Followup  - 3 months

## 2012-08-27 NOTE — Progress Notes (Signed)
Subjective:    Patient ID: [name], female    DOB: 1961/08/05, 51 y.o.   MRN: [id number]  HPI # Breast cancer in 2005   - Treated at [facility]. Dr Doyne Keel.  Under remission per hx No oncologist in [place].  # Bilateral pulmonary embolisms in 2001    - "both lungs". Diagnosed at [facility]. Treatd in medical floor with heparin and coumadin. Per hx no oxygenation, mental status or hemodynamic issues at time of dx  - s/p coumadin Rx for 1 year. # She has had a history of 12 prior suicide attempts #Bipolar disorder #Hyperlipidemia #Obesity  Body mass index is 43.53 kg/(m^2). on 01/29/12  #Dyspnea    - dyspnea due to obesity, asthma (proven on mc challenge and EIB on CPST fall 2011) +/- VCD. Placed on qvar end 2011  December 19, 2010: Last visit was 09/06/2010. Started on QVAR at that time for new dx of asthma. Now reporting for fu. States qvar helping a lot. Less dyspneic. Rescue albueterol use is only 1 per 2 weeks. Less chest tightness. Improved dyspnea. Denies associated cough, wheeze. Still with some dysnea when she climbs stairs. No new complaints. Reports qvar compliance. STrluggling to lose weight PLAN  -discussed weight loss. Encouraged to joine a Cytogeneticist or zone diet - contnue qvar (sample given and MDI tech taught) - rov 6 months  OV 01/29/2012 Followup dyspnea due to above. Suppsed to have returned in 6 months since Jan 2012 but returning only a full year later. Has joined weight watchers past few months and has lost 7# weight. 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 Therefore, very little time to exercise.ll. Feels asthma is under control. Compliant with qvar but she feels that once she makes time to exercise qvar will not be sufficient. This is because when she climbs 2-3 flights of stairs she gets dyspneic and needs albuterol. She wants LABA for asthma as well.   Past, Family, Social reviewed: no change since  last visit other than noted in HPI    #asthma  - glad this is stable  - due to increased exercise needs stop qvar but instated Please start symbicort 80/4.5 2 puff twice daily - take sample, script and show technique  - take pm dose of symbicort 10 min before exercise in evening  - take albuterol 2 puff as needed  - glad you had flu shot  #Weight management  - glad you are in weight watcher and you have lost 7#; continue with them  - you need to exercise regularly atleast per week  - one idea is to do ellipitcal and do your law school and criminal justice reading with your tablet computer on the elliptica  - for fat burn, more important to work out longer time (say 40-64min) at heart rate 105-115 per min which is around 55% age predicted max for you  #Followup  - 6 mmonths or sooner  OV 08/27/2012  #Weight  - Body mass index is 43.63 kg/(m^2). Has gained weight. Not exercising anymore. Recent ankle fracture a handicap. WEnt through her diet and she is largely living eating moderate and high glycemic foods. Over abundance of pineapple, banana, sugared soy milk, instant oat meal, occasional chinese food. She does eat some non-starchy vegetable but diet is devoid of nuts and low glycemic fruits.   #Asthma  - stable. No dyspnea. Continues with symbicort. Wil have flu shot today  Past,  Family, Social reviewed: fractured right ankle trying to stamp out a spider that got away. CAst off. Now on lovenox and coumadin. No issue   Review of Systems  Constitutional: Negative for fever and unexpected weight change.  HENT: Negative for ear pain, nosebleeds, congestion, sore throat, rhinorrhea, sneezing, trouble swallowing, dental problem, postnasal drip and sinus pressure.   Eyes: Negative for redness and itching.  Respiratory: Negative for cough, chest tightness, shortness of breath and wheezing.   Cardiovascular: Negative for palpitations and leg swelling.  Gastrointestinal: Negative for  nausea and vomiting.  Genitourinary: Negative for dysuria.  Musculoskeletal: Negative for joint swelling.  Skin: Negative for rash.  Neurological: Negative for headaches.  Hematological: Does not bruise/bleed easily.  Psychiatric/Behavioral: Negative for dysphoric mood. The patient is not nervous/anxious.        Objective:   Physical Exam General:  well developed, well nourished, in no acute distressobese.   Head:  normocephalic and atraumatic Eyes:  PERRLA/EOM intact; conjunctiva and sclera clear Ears:  TMs intact and clear with normal canals Nose:  no deformity, discharge, inflammation, or lesions Mouth:  no deformity or lesions Neck:  no masses, thyromegaly, or abnormal cervical nodes Chest Wall:  no deformities noted Lungs:  clear bilaterally to auscultation and percussion Heart:  regular rate and rhythm, S1, S2 without murmurs, rubs, gallops, or clicks Abdomen:  bowel sounds positive; abdomen soft and non-tender without masses, or organomegaly Msk:  no deformity or scoliosis noted with normal posture Pulses:  pulses normal Extremities:  left arm mid 1/3  - indurated area of 3cm but no redness or crepitus but moderately tender Neurologic:  CN II-XII grossly intact with normal reflexes, coordination, muscle strength and tone Skin:  intact without lesions or rashes Cervical Nodes:  no significant adenopathy Axillary Nodes:  no significant adenopathy Psych:  alert and cooperative; normal mood and affect; normal attention span and concentration        Assessment & Plan:

## 2012-08-31 NOTE — Assessment & Plan Note (Signed)
#  asthma   - glad this is stable  - continue symbicort 80/4.5 2 puff twice daily -  - take albuterol 2 puff as needed - flu shot today 08/27/2012   #Followup  - 3 months

## 2012-08-31 NOTE — Assessment & Plan Note (Addendum)
Body mass index is 43.53 kg/(m^2). on 01/29/12 Body mass index is 43.53 kg/(m^2). on 01/30/2012 Body mass index is 43.63 kg/(m^2). on 9/;18/13  PLAN  #Weight management  - we discussed extensively about weight management   - follow low glycemic diet plan that I outlined for you after extensive discussion  - For breakfast   - recommend steel cut oat meal or fiber one 60 cal (half to one cup) with low sugar 60 cal silk soymilk and some berries and one egg  - snack with berries or low glycemic fruit or mens health 35gm mixed-nut pack  - lunch and dinner - unlimited non-starchy vegetable (prefer raw fresh or roasted or grilled) with skinless chicken or fish   - drink lot of water  - avoid all moderate and high glycemic foods  - measure weight once  A week  - download myfitness pal app onto your phone and set weight loss goal of 1-2 pounds per month   > 50% of this > 25 min visit spent in face to face counseling (15 min visit converted to 25 min)

## 2012-12-16 ENCOUNTER — Ambulatory Visit: Payer: BC Managed Care – PPO | Admitting: Family

## 2012-12-17 ENCOUNTER — Telehealth: Payer: Self-pay | Admitting: Oncology

## 2012-12-17 ENCOUNTER — Encounter: Payer: Self-pay | Admitting: Oncology

## 2012-12-17 ENCOUNTER — Ambulatory Visit (HOSPITAL_BASED_OUTPATIENT_CLINIC_OR_DEPARTMENT_OTHER): Payer: 59 | Admitting: Oncology

## 2012-12-17 VITALS — BP 126/84 | HR 92 | Temp 97.8°F | Resp 20 | Ht 65.0 in | Wt 265.6 lb

## 2012-12-17 DIAGNOSIS — N6489 Other specified disorders of breast: Secondary | ICD-10-CM

## 2012-12-17 DIAGNOSIS — Z853 Personal history of malignant neoplasm of breast: Secondary | ICD-10-CM

## 2012-12-17 NOTE — Patient Instructions (Addendum)
[name] with Law school!!!

## 2012-12-17 NOTE — Progress Notes (Signed)
OFFICE PROGRESS NOTE  CC  Dr. Dorothyann Peng   DIAGNOSIS: 52 year old female with invasive ductal carcinoma originally diagnosed in 2004 in [place]. Patient was originally seen by me on 11/06/2010 her  PRIOR THERAPY:  #1 or 2004 patient felt a mass. She then went on to have a lumpectomy of this mass. The final pathology revealed an invasive ductal carcinoma that was ER positive PR positive. Patient recollected that she did receive neoadjuvant chemotherapy consisting of Adriamycin and possibly he had an excellent response. And then in July 2005 she had her lumpectomy. Followed by radiation therapy. She was then begun on Arimidex completing in 2010.  #2 she also underwent bilateral oophorectomies.  #3 she relocated to [facility] and has been seeing Dr. Leontine Locket as her for her primary care health  CURRENT THERAPY: Observation  INTERVAL HISTORY: IYARI HAGNER 52 y.o. female returns for followup visit at one year. She was last seen back in 2012. Overall she seems to be doing well.  He denies any fevers chills night sweats headaches no shortness of breath chest pains palpitations. Her main concern is her weight gain. She has been diagnosed with asthma and she is unable to exercise to keep the weight off of her. She is trying to diet but states that it is not working for her. She also continues to undergo therapy for her bipolar disorder and is managed very well. Patient tells me that she mainly has manic episodes rather than depressive episodes.  MEDICAL HISTORY: Past Medical History  Diagnosis Date  . Asthma   . Depression   . Bipolar affective disorder, depressed, mild     Remote history of suicidal attempts  . Breast cancer     In 2005 treated with surgery and Arimidex for 5 years  . History of pulmonary embolism     Bilateral in 2001  . Bipolar 1 disorder   . Anginal pain     11/2011  . Shortness of breath     in the past  . Headache     h/o migraines   .  HNP (herniated nucleus pulposus)     2000- L4-5, used pain mgt. prog. in [place]    ALLERGIES:  is allergic to gabapentin; codeine; lamotrigine; shellfish allergy; and topiramate.  MEDICATIONS:  Current Outpatient Prescriptions  Medication Sig Dispense Refill  . albuterol (PROVENTIL HFA;VENTOLIN HFA) 108 (90 BASE) MCG/ACT inhaler Inhale 2 puffs into the lungs every 6 (six) hours as needed. sob       . atorvastatin (LIPITOR) 20 MG tablet Take 1 tablet by mouth Daily.      Marland Kitchen FLUoxetine (PROZAC) 20 MG capsule Take 20 mg by mouth daily.        Marland Kitchen lithium 600 MG capsule Take 600 mg by mouth 2 (two) times daily with a meal.       . risperiDONE (RISPERDAL) 1 MG tablet Take 1 mg by mouth daily.          SURGICAL HISTORY:  Past Surgical History  Procedure Date  . Abdominal hysterectomy   . Mastopexy 02/07/2012    Procedure: MASTOPEXY;  Surgeon: Wayland Denis, DO;  Location: [facility];  Service: Plastics;  Laterality: Left;  left breast mastopexy/reduction for symmetry after right breast cancer  . Breast lumpectomy     2005  . Tubal ligation   . Orif ankle fracture 06/26/2012    Procedure: OPEN REDUCTION INTERNAL FIXATION (ORIF) ANKLE FRACTURE;  Surgeon: Cammy Copa, MD;  Location: [facility] OR;  Service: Orthopedics;  Laterality: Right;    REVIEW OF SYSTEMS:  Constitutional: negative Eyes: negative Ears, nose, mouth, throat, and face: negative Respiratory: positive for asthma Cardiovascular: negative Gastrointestinal: negative Genitourinary:negative Hematologic/lymphatic: negative Musculoskeletal:negative Neurological: negative Behavioral/Psych: positive for Bipolar disorder with manic phase is.   PHYSICAL EXAMINATION: General appearance: alert, cooperative and moderately obese Head: Normocephalic, without obvious abnormality, atraumatic Neck: no adenopathy, no carotid bruit, no JVD, supple, symmetrical, trachea midline and thyroid not enlarged, symmetric, no  tenderness/mass/nodules Lymph nodes: Cervical, supraclavicular, and axillary nodes normal. Resp: clear to auscultation bilaterally and normal percussion bilaterally Back: symmetric, no curvature. ROM normal. No CVA tenderness. Cardio: regular rate and rhythm, S1, S2 normal, no murmur, click, rub or gallop and normal apical impulse GI: soft, non-tender; bowel sounds normal; no masses,  no organomegaly Extremities: extremities normal, atraumatic, no cyanosis or edema Neurologic: Alert and oriented X 3, normal strength and tone. Normal symmetric reflexes. Normal coordination and gait Mental status: Alert, oriented, thought content appropriate Sensory: normal Motor: grossly normal Reflexes: normal 2+ and symmetric Bilateral breasts are examined. The right breast does reveal a central lumpectomy there is no evidence of masses no skin changes. Left breast no masses or nipple discharge.  ECOG PERFORMANCE STATUS: 0 - Asymptomatic  Blood pressure 126/84, pulse 92, temperature 97.8 F (36.6 C), resp. rate 20, height 5\' 5"  (1.651 m), weight 265 lb 9.6 oz (120.475 kg).  LABORATORY DATA: Lab Results  Component Value Date   WBC 7.0 06/26/2012   HGB 12.9 06/26/2012   HCT 39.1 06/26/2012   MCV 85.7 06/26/2012   PLT 198 06/26/2012      Chemistry      Component Value Date/Time   NA 135 06/26/2012 1645   K 4.2 06/26/2012 1645   CL 100 06/26/2012 1645   CO2 25 06/26/2012 1645   BUN 10 06/26/2012 1645   CREATININE 0.83 06/26/2012 1645      Component Value Date/Time   CALCIUM 9.8 06/26/2012 1645   ALKPHOS 71 11/07/2011 1318   AST 24 11/07/2011 1318   ALT 40* 11/07/2011 1318   BILITOT 0.5 11/07/2011 1318       RADIOGRAPHIC STUDIES:  No results found.  ASSESSMENT: 52 year old female with  #1 invasive ductal carcinoma of the right breast originally diagnosed in 2004. At that time she underwent neoadjuvant chemotherapy consisting of Adriamycin Cytoxan. This was followed by lumpectomy followed by  radiation therapy. Her tumor was estrogen receptor and progesterone receptor positive. So after completion of radiation therapy she received 5 years of Arimidex. Patient also has had bilateral salpingo-oophorectomies. She completed her Arimidex therapy in 2010.  #2 patient has had genetic testing performed and it was read out to be negative both BRCA1 and BRCA2 gene mutation.  #3 patient is concerned about her weight gain.   #4 asthma she continues to be seen by pulmonary   PLAN:   #1 breast cancer we will continue to observe her on a yearly basis. She has had her mammogram which was negative.  All questions were answered. The patient knows to call the clinic with any problems, questions or concerns. We can certainly see the patient much sooner if necessary.  I spent 15 minutes counseling the patient face to face. The total time spent in the appointment was 30 minutes.    Drue Second, MD Medical/Oncology [facility] [phone] (beeper) [phone] (Office)  12/17/2012, 3:17 PM

## 2012-12-17 NOTE — Telephone Encounter (Signed)
Per 1/8 pof f/u as needed.

## 2013-03-19 ENCOUNTER — Other Ambulatory Visit: Payer: Self-pay | Admitting: Orthopedic Surgery

## 2013-03-19 DIAGNOSIS — M25561 Pain in right knee: Secondary | ICD-10-CM

## 2013-03-24 ENCOUNTER — Ambulatory Visit
Admission: RE | Admit: 2013-03-24 | Discharge: 2013-03-24 | Disposition: A | Discharge status: Home / Self Care | Payer: 59 | Source: Ambulatory Visit | Attending: Orthopedic Surgery | Admitting: Orthopedic Surgery

## 2013-03-24 DIAGNOSIS — M25561 Pain in right knee: Secondary | ICD-10-CM

## 2020-12-10 HISTORY — PX: BREAST BIOPSY: SHX20

## 2023-05-13 ENCOUNTER — Ambulatory Visit: Payer: Medicaid Other | Admitting: Family

## 2023-05-17 ENCOUNTER — Ambulatory Visit (INDEPENDENT_AMBULATORY_CARE_PROVIDER_SITE_OTHER): Payer: BC Managed Care – PPO | Admitting: Family

## 2023-05-17 VITALS — BP 128/86 | HR 81 | Temp 97.6°F | Resp 20 | Ht 65.0 in | Wt 259.4 lb

## 2023-05-17 DIAGNOSIS — E785 Hyperlipidemia, unspecified: Secondary | ICD-10-CM | POA: Diagnosis not present

## 2023-05-17 DIAGNOSIS — M25511 Pain in right shoulder: Secondary | ICD-10-CM

## 2023-05-17 DIAGNOSIS — Z87891 Personal history of nicotine dependence: Secondary | ICD-10-CM

## 2023-05-17 DIAGNOSIS — F909 Attention-deficit hyperactivity disorder, unspecified type: Secondary | ICD-10-CM

## 2023-05-17 DIAGNOSIS — F411 Generalized anxiety disorder: Secondary | ICD-10-CM | POA: Diagnosis not present

## 2023-05-17 DIAGNOSIS — F319 Bipolar disorder, unspecified: Secondary | ICD-10-CM | POA: Diagnosis not present

## 2023-05-17 DIAGNOSIS — F5101 Primary insomnia: Secondary | ICD-10-CM

## 2023-05-17 DIAGNOSIS — Z1159 Encounter for screening for other viral diseases: Secondary | ICD-10-CM

## 2023-05-17 DIAGNOSIS — Z113 Encounter for screening for infections with a predominantly sexual mode of transmission: Secondary | ICD-10-CM

## 2023-05-17 DIAGNOSIS — Z1231 Encounter for screening mammogram for malignant neoplasm of breast: Secondary | ICD-10-CM

## 2023-05-17 DIAGNOSIS — I10 Essential (primary) hypertension: Secondary | ICD-10-CM | POA: Diagnosis not present

## 2023-05-17 DIAGNOSIS — Z1211 Encounter for screening for malignant neoplasm of colon: Secondary | ICD-10-CM

## 2023-05-17 NOTE — Progress Notes (Signed)
Provider: Richarda Blade FNP-C   Mikell Camp, Donalee Citrin, NP  Patient Care Team: Malva Diesing, Donalee Citrin, NP as PCP - General (Family Medicine)  Extended Emergency Contact Information Primary Emergency Contact: Chapman,Charles Address: [address]          Jacky Kindle [place] Home Phone: [phone] Mobile Phone: [phone] Relation: Spouse Secondary Emergency Contact: Kelly,Dale Mobile Phone: [phone] Relation: Friend Preferred language: English Interpreter needed? No  Code Status:  Full Code  Goals of care: Advanced Directive information    06/27/2012   12:26 AM  Advanced Directives  Does Patient Have a Medical Advance Directive? Patient does not have advance directive;Patient would like information  Pre-existing out of facility DNR order (yellow form or pink MOST form) No     Chief Complaint  Patient presents with   New Patient (Initial Visit)    Patient presents today for a new patient appointment    HPI:  Pt is a 62 y.o. female seen today establish care here at [facility] and Adult  care for medical management of chronic diseases.Has a medical histroy of Hyperlipidemia,Bipolar affective disorder,Asthma , Obesity among others.   Hyperlipidemia - previous LDL 107   Bipolar - on Abilify injection due to previous noncompliance with medication. Has been out of the medication since relocating from [place] in February,2024. Request referral to psychiatrist.  ADHD -  on atomoxetine   Insominia - takes Trazodone 50 mg tablet PRN.rarely takes it.   Generalized anxiety - current on lorazepam 1 mg Tablet PRN.sometimes takes once or twice a week.   Asthma - on albuterol PRN controlled   Obesity - states does not exercise.Discuss exercise at least three times per week for 30 minutes.walking or swimming exercise discussed.  Hx of PE - took coumadin for one year 2001.No recurrent symptoms.   Former smoker - smoked 1/2 pack per day on and  off for 30 years.Quit in 2011  Hx of right breast cancer - Had chemo,radiation and surgery lumpectomy.Dx 2004 and completed treatment in 2005.Took Arimedex for 5 yrs.  Had left breast reduction in 2012.   Past Medical History:  Diagnosis Date   Anginal pain (HCC)    11/2011   Asthma    Bipolar 1 disorder (HCC)    Bipolar affective disorder, depressed, mild (HCC)    Remote history of suicidal attempts   Breast cancer (HCC)    In 2005 treated with surgery and Arimidex for 5 years   Depression    Headache(784.0)    h/o migraines    History of pulmonary embolism    Bilateral in 2001   HNP (herniated nucleus pulposus)    2000- L4-5, used pain mgt. prog. in [place]   Hypertension    Shortness of breath    in the past   Past Surgical History:  Procedure Laterality Date   ABDOMINAL HYSTERECTOMY     BREAST LUMPECTOMY     2005   MASTOPEXY  02/07/2012   Procedure: MASTOPEXY;  Surgeon: Wayland Denis, DO;  Location: [facility];  Service: Plastics;  Laterality: Left;  left breast mastopexy/reduction for symmetry after right breast cancer   ORIF ANKLE FRACTURE  06/26/2012   Procedure: OPEN REDUCTION INTERNAL FIXATION (ORIF) ANKLE FRACTURE;  Surgeon: Cammy Copa, MD;  Location: [facility] OR;  Service: Orthopedics;  Laterality: Right;   TUBAL LIGATION      Allergies  Allergen Reactions   Gabapentin Swelling   Codeine     REACTION:  lethargic   Lamotrigine     REACTION: lips swell   Shellfish Allergy     Some shellfish- is able to eat shrimp but not mussels, clams    Topiramate     REACTION: hallucinations    Allergies as of 05/17/2023       Reactions   Gabapentin Swelling   Codeine    REACTION: lethargic   Lamotrigine    REACTION: lips swell   Shellfish Allergy    Some shellfish- is able to eat shrimp but not mussels, clams    Topiramate    REACTION: hallucinations        Medication List        Accurate as of May 17, 2023  9:16 AM. If you have any  questions, ask your nurse or doctor.          albuterol 108 (90 Base) MCG/ACT inhaler Commonly known as: VENTOLIN HFA Inhale 2 puffs into the lungs every 6 (six) hours as needed. sob   atorvastatin 20 MG tablet Commonly known as: LIPITOR Take 1 tablet by mouth Daily.   FLUoxetine 20 MG capsule Commonly known as: PROZAC Take 20 mg by mouth daily.   lithium 600 MG capsule Take 600 mg by mouth 2 (two) times daily with a meal.   risperiDONE 1 MG tablet Commonly known as: RISPERDAL Take 1 mg by mouth daily.        Review of Systems  Constitutional:  Negative for appetite change, chills, fatigue, fever and unexpected weight change.  HENT:  Negative for congestion, ear discharge, ear pain, hearing loss, nosebleeds, postnasal drip, rhinorrhea, sinus pressure, sinus pain, sneezing, sore throat, tinnitus and trouble swallowing.   Eyes:  Negative for pain, discharge, redness, itching and visual disturbance.  Respiratory:  Negative for cough, chest tightness, shortness of breath and wheezing.   Cardiovascular:  Negative for chest pain, palpitations and leg swelling.  Gastrointestinal:  Negative for abdominal distention, abdominal pain, blood in stool, constipation, diarrhea, nausea and vomiting.  Endocrine: Negative for cold intolerance, heat intolerance, polydipsia, polyphagia and polyuria.  Genitourinary:  Negative for difficulty urinating, dysuria, flank pain, frequency and urgency.  Musculoskeletal:  Negative for arthralgias, back pain, gait problem, joint swelling, myalgias, neck pain and neck stiffness.  Skin:  Negative for color change, pallor, rash and wound.  Neurological:  Negative for dizziness, syncope, speech difficulty, weakness, light-headedness, numbness and headaches.  Hematological:  Does not bruise/bleed easily.  Psychiatric/Behavioral:  Negative for agitation, behavioral problems, confusion, hallucinations, self-injury, sleep disturbance and suicidal ideas. The  patient is nervous/anxious.     Immunization History  Administered Date(s) Administered   Influenza Split 11/14/2011, 08/27/2012   Influenza,inj,quad, With Preservative 02/18/2019   PFIZER(Purple Top)SARS-COV-2 Vaccination 03/15/2020, 04/05/2020, 01/02/2021   Pertinent  Health Maintenance Due  Topic Date Due   PAP SMEAR-Modifier  Never done   Colonoscopy  Never done   MAMMOGRAM  08/25/2014   INFLUENZA VACCINE  07/11/2023       No data to display         Functional Status Survey:    Vitals:   05/17/23 0856  BP: 128/86  Pulse: 81  Resp: 20  Temp: 97.6 F (36.4 C)  SpO2: 98%  Weight: 259 lb 6.4 oz (117.7 kg)  Height: 5\' 5"  (1.651 m)   Body mass index is 43.17 kg/m. Physical Exam Vitals reviewed.  Constitutional:      General: She is not in acute distress.    Appearance: Normal appearance. She is obese.  She is not ill-appearing or diaphoretic.  HENT:     Head: Normocephalic.     Right Ear: Tympanic membrane, ear canal and external ear normal. There is no impacted cerumen.     Left Ear: Tympanic membrane, ear canal and external ear normal. There is no impacted cerumen.     Nose: Nose normal. No congestion or rhinorrhea.     Mouth/Throat:     Mouth: Mucous membranes are moist.     Pharynx: Oropharynx is clear. No oropharyngeal exudate or posterior oropharyngeal erythema.  Eyes:     General: No scleral icterus.       Right eye: No discharge.        Left eye: No discharge.     Extraocular Movements: Extraocular movements intact.     Conjunctiva/sclera: Conjunctivae normal.     Pupils: Pupils are equal, round, and reactive to light.  Neck:     Vascular: No carotid bruit.  Cardiovascular:     Rate and Rhythm: Normal rate and regular rhythm.     Pulses: Normal pulses.     Heart sounds: Normal heart sounds. No murmur heard.    No friction rub. No gallop.  Pulmonary:     Effort: Pulmonary effort is normal. No respiratory distress.     Breath sounds: Normal breath  sounds. No wheezing, rhonchi or rales.  Chest:     Chest wall: No tenderness.  Abdominal:     General: Bowel sounds are normal. There is no distension.     Palpations: Abdomen is soft. There is no mass.     Tenderness: There is no abdominal tenderness. There is no right CVA tenderness, left CVA tenderness, guarding or rebound.  Musculoskeletal:        General: No swelling or tenderness. Normal range of motion.     Cervical back: Normal range of motion. No rigidity or tenderness.     Right lower leg: No edema.     Left lower leg: No edema.  Lymphadenopathy:     Cervical: No cervical adenopathy.  Skin:    General: Skin is warm and dry.     Coloration: Skin is not pale.     Findings: No bruising, erythema, lesion or rash.  Neurological:     Mental Status: She is alert and oriented to person, place, and time.     Cranial Nerves: No cranial nerve deficit.     Sensory: No sensory deficit.     Motor: No weakness.     Coordination: Coordination normal.     Gait: Gait normal.  Psychiatric:        Mood and Affect: Mood is anxious.        Speech: Speech normal.        Behavior: Behavior normal.        Thought Content: Thought content normal.        Judgment: Judgment normal.     Labs reviewed: No results for input(s): "NA", "K", "CL", "CO2", "GLUCOSE", "BUN", "CREATININE", "CALCIUM", "MG", "PHOS" in the last 8760 hours. No results for input(s): "AST", "ALT", "ALKPHOS", "BILITOT", "PROT", "ALBUMIN" in the last 8760 hours. No results for input(s): "WBC", "NEUTROABS", "HGB", "HCT", "MCV", "PLT" in the last 8760 hours. Lab Results  Component Value Date   TSH 2.697 11/14/2011   No results found for: "HGBA1C" Lab Results  Component Value Date   CHOL 154 11/14/2011   HDL 46 11/14/2011   LDLCALC 82 11/14/2011   TRIG 132 11/14/2011   CHOLHDL 3.3 11/14/2011  Significant Diagnostic Results in last 30 days:  No results found.  Assessment/Plan  1. Hyperlipidemia LDL goal  <100 Previous LDL at goal -Currently off atorvastatin - Lipid panel  2. Essential hypertension Blood pressure well-controlled -Continue on lisinopril, spironolactone and propranolol -Not on aspirin and off statin -Dietary modification and exercise at least 3 times per week for 30 minutes advised - TSH - COMPLETE METABOLIC PANEL WITH GFR - CBC with Differential/Platelet  3. Bipolar 1 disorder (HCC) Mood stable -Continue on Abilify IM due to previous noncompliance with oral medication.Will send referral to psychiatry to establish care and manage Abilify injection - Ambulatory referral to Psychiatry  4. Generalized anxiety disorder Continue on lorazepam as needed - Ambulatory referral to Psychiatry  5. Primary insomnia Continue on trazodone as needed - Ambulatory referral to Psychiatry  6. Breast cancer screening by mammogram History of breast cancer.  No recurrent symptoms reported. - MM DIGITAL SCREENING BILATERAL  7. Attention deficit hyperactivity disorder (ADHD), unspecified ADHD type -Continue on Strattera - Ambulatory referral to Psychiatry  8. Former smoker Smoked half a pack on and off for 30 years - CT CHEST LUNG CA SCREEN LOW DOSE W/O CM; Future  9. Encounter for hepatitis C screening test for low risk patient Low risk - Hepatitis C antibody  10. Screen for STD (sexually transmitted disease) No high risk behaviors reported - HIV Antibody (routine testing w rflx)  11. Colon cancer screening Also due for colonoscopy.Cologuard versus colonoscopy discussed patient prefers Cologuard.Made aware Kit will be send to his home address then follow direction and mail.  - Cologuard  12. Acute pain of right shoulder Limited range of motion with internal and external rotation of shoulder.Will obtain imaging - DG Shoulder Right; Future  Family/ staff Communication: Reviewed plan of care with patient verbalized understanding  Labs/tests ordered:  - DG Shoulder Right;  Future - CT CHEST LUNG CA SCREEN LOW DOSE W/O CM; Future - Cologuard - Hepatitis C antibody - TSH - COMPLETE METABOLIC PANEL WITH GFR - CBC with Differential/Platelet  Next Appointment : Return in about 6 months (around 11/16/2023) for medical mangement of chronic issues., fasting labs in one week, pap smear in 1 week .   Caesar Bookman, NP

## 2023-05-17 NOTE — Patient Instructions (Signed)
-   Please get right shoulder X-ray at [facility] imaging at [facility] then will call you with results.  - Extra strength tylenol 500 mg tablet one by mouth every 8 hrs as needed for right shoulder pain  - use ice /heat compressor on right shoulder 15 -20 minutes daily for pain.

## 2023-05-29 ENCOUNTER — Ambulatory Visit: Payer: BC Managed Care – PPO | Admitting: Adult Health

## 2023-05-29 ENCOUNTER — Ambulatory Visit
Admission: RE | Admit: 2023-05-29 | Discharge: 2023-05-29 | Disposition: A | Discharge status: Home / Self Care | Payer: Medicaid Other | Source: Ambulatory Visit | Attending: Family | Admitting: Family

## 2023-05-29 ENCOUNTER — Other Ambulatory Visit: Payer: BC Managed Care – PPO

## 2023-05-29 ENCOUNTER — Ambulatory Visit
Admission: RE | Admit: 2023-05-29 | Discharge: 2023-05-29 | Disposition: A | Discharge status: Home / Self Care | Payer: BC Managed Care – PPO | Source: Ambulatory Visit | Attending: Family | Admitting: Family

## 2023-05-29 DIAGNOSIS — M25511 Pain in right shoulder: Secondary | ICD-10-CM | POA: Diagnosis not present

## 2023-05-29 DIAGNOSIS — Z87891 Personal history of nicotine dependence: Secondary | ICD-10-CM

## 2023-05-31 NOTE — Progress Notes (Signed)
-    right shoulder negative for dislocation, fracture nor arthritis.

## 2023-06-12 ENCOUNTER — Other Ambulatory Visit: Payer: BC Managed Care – PPO

## 2023-06-12 DIAGNOSIS — I10 Essential (primary) hypertension: Secondary | ICD-10-CM | POA: Diagnosis not present

## 2023-06-12 DIAGNOSIS — Z113 Encounter for screening for infections with a predominantly sexual mode of transmission: Secondary | ICD-10-CM | POA: Diagnosis not present

## 2023-06-12 DIAGNOSIS — Z1159 Encounter for screening for other viral diseases: Secondary | ICD-10-CM | POA: Diagnosis not present

## 2023-06-12 DIAGNOSIS — E785 Hyperlipidemia, unspecified: Secondary | ICD-10-CM | POA: Diagnosis not present

## 2023-06-12 LAB — CBC WITH DIFFERENTIAL/PLATELET
Basophils Absolute: 31 cells/uL (ref 0–200)
MCHC: 33.5 g/dL (ref 32.0–36.0)
MCV: 85.3 fL (ref 80.0–100.0)
Neutro Abs: 1448 cells/uL — ABNORMAL LOW (ref 1500–7800)
RBC: 4.48 10*6/uL (ref 3.80–5.10)
Total Lymphocyte: 45.6 %

## 2023-06-13 LAB — TSH: TSH: 1.21 mIU/L (ref 0.40–4.50)

## 2023-06-13 LAB — COMPLETE METABOLIC PANEL WITH GFR
AG Ratio: 1.4 (calc) (ref 1.0–2.5)
ALT: 16 U/L (ref 6–29)
AST: 12 U/L (ref 10–35)
Albumin: 4 g/dL (ref 3.6–5.1)
Alkaline phosphatase (APISO): 86 U/L (ref 37–153)
BUN: 14 mg/dL (ref 7–25)
CO2: 24 mmol/L (ref 20–32)
Calcium: 9.1 mg/dL (ref 8.6–10.4)
Chloride: 105 mmol/L (ref 98–110)
Creat: 0.77 mg/dL (ref 0.50–1.05)
Globulin: 2.9 g/dL (calc) (ref 1.9–3.7)
Glucose, Bld: 103 mg/dL — ABNORMAL HIGH (ref 65–99)
Potassium: 4.1 mmol/L (ref 3.5–5.3)
Sodium: 140 mmol/L (ref 135–146)
Total Bilirubin: 0.5 mg/dL (ref 0.2–1.2)
Total Protein: 6.9 g/dL (ref 6.1–8.1)
eGFR: 88 mL/min/{1.73_m2} (ref >=60)

## 2023-06-13 LAB — LIPID PANEL
Cholesterol: 282 mg/dL — ABNORMAL HIGH (ref <200)
HDL: 55 mg/dL (ref >=50)
LDL Cholesterol (Calc): 202 mg/dL (calc) — ABNORMAL HIGH
Non-HDL Cholesterol (Calc): 227 mg/dL (calc) — ABNORMAL HIGH (ref <130)
Total CHOL/HDL Ratio: 5.1 (calc) — ABNORMAL HIGH (ref <5.0)
Triglycerides: 112 mg/dL (ref <150)

## 2023-06-13 LAB — CBC WITH DIFFERENTIAL/PLATELET
Absolute Monocytes: 309 cells/uL (ref 200–950)
Basophils Relative: 0.9 %
Eosinophils Absolute: 61 cells/uL (ref 15–500)
Eosinophils Relative: 1.8 %
HCT: 38.2 % (ref 35.0–45.0)
Hemoglobin: 12.8 g/dL (ref 11.7–15.5)
Lymphs Abs: 1550 cells/uL (ref 850–3900)
MCH: 28.6 pg (ref 27.0–33.0)
MPV: 12.6 fL — ABNORMAL HIGH (ref 7.5–12.5)
Monocytes Relative: 9.1 %
Neutrophils Relative %: 42.6 %
Platelets: 200 10*3/uL (ref 140–400)
RDW: 12.8 % (ref 11.0–15.0)
WBC: 3.4 10*3/uL — ABNORMAL LOW (ref 3.8–10.8)

## 2023-06-13 LAB — HEPATITIS C ANTIBODY: Hepatitis C Ab: NONREACTIVE

## 2023-06-13 LAB — HIV ANTIBODY (ROUTINE TESTING W REFLEX): HIV 1&2 Ab, 4th Generation: NONREACTIVE

## 2023-06-14 ENCOUNTER — Ambulatory Visit: Payer: BC Managed Care – PPO | Admitting: Family

## 2023-06-18 DIAGNOSIS — L239 Allergic contact dermatitis, unspecified cause: Secondary | ICD-10-CM | POA: Diagnosis not present

## 2023-06-18 DIAGNOSIS — W57XXXA Bitten or stung by nonvenomous insect and other nonvenomous arthropods, initial encounter: Secondary | ICD-10-CM | POA: Diagnosis not present

## 2023-06-18 DIAGNOSIS — S60561A Insect bite (nonvenomous) of right hand, initial encounter: Secondary | ICD-10-CM | POA: Diagnosis not present

## 2023-06-20 ENCOUNTER — Ambulatory Visit
Admission: RE | Admit: 2023-06-20 | Discharge: 2023-06-20 | Disposition: A | Discharge status: Home / Self Care | Payer: BC Managed Care – PPO | Source: Ambulatory Visit | Attending: Family | Admitting: Family

## 2023-06-20 ENCOUNTER — Encounter: Payer: Self-pay | Admitting: Family

## 2023-06-20 ENCOUNTER — Ambulatory Visit (INDEPENDENT_AMBULATORY_CARE_PROVIDER_SITE_OTHER): Payer: BC Managed Care – PPO | Admitting: Family

## 2023-06-20 VITALS — BP 124/80 | HR 84 | Temp 97.3°F | Ht 65.0 in | Wt 260.0 lb

## 2023-06-20 DIAGNOSIS — I700 Atherosclerosis of aorta: Secondary | ICD-10-CM | POA: Diagnosis not present

## 2023-06-20 DIAGNOSIS — J432 Centrilobular emphysema: Secondary | ICD-10-CM | POA: Diagnosis not present

## 2023-06-20 DIAGNOSIS — Z87891 Personal history of nicotine dependence: Secondary | ICD-10-CM

## 2023-06-20 DIAGNOSIS — E785 Hyperlipidemia, unspecified: Secondary | ICD-10-CM | POA: Diagnosis not present

## 2023-06-20 DIAGNOSIS — W57XXXD Bitten or stung by nonvenomous insect and other nonvenomous arthropods, subsequent encounter: Secondary | ICD-10-CM

## 2023-06-20 DIAGNOSIS — M25511 Pain in right shoulder: Secondary | ICD-10-CM

## 2023-06-20 DIAGNOSIS — S40862D Insect bite (nonvenomous) of left upper arm, subsequent encounter: Secondary | ICD-10-CM

## 2023-06-20 HISTORY — DX: Personal history of antineoplastic chemotherapy: Z92.21

## 2023-06-20 HISTORY — DX: Personal history of irradiation: Z92.3

## 2023-06-20 MED ORDER — ASPIRIN 81 MG PO TBEC
81.0000 mg | DELAYED_RELEASE_TABLET | Freq: Every day | ORAL | Status: AC
Start: 2023-06-20 — End: ?

## 2023-06-20 MED ORDER — ATORVASTATIN CALCIUM 10 MG PO TABS: 10.0000 mg | ORAL_TABLET | Freq: Every day | ORAL | 3 refills | Status: DC

## 2023-06-20 NOTE — Progress Notes (Signed)
Provider: Richarda Blade FNP-C  Reiley Bertagnolli, Donalee Citrin, NP  Patient Care Team: Angeline Trick, Donalee Citrin, NP as PCP - General (Family Medicine)  Extended Emergency Contact Information Primary Emergency Contact: Kelly,Dale Mobile Phone: [phone] Relation: Friend Preferred language: English Interpreter needed? No Secondary Emergency Contact: McDavid,Kenneth Mobile Phone: [phone] Relation: Spouse Preferred language: English Interpreter needed? No  Code Status:  Full Code  Goals of care: Advanced Directive information    06/27/2012   12:26 AM  Advanced Directives  Does Patient Have a Medical Advance Directive? Patient does not have advance directive;Patient would like information  Pre-existing out of facility DNR order (yellow form or pink MOST form) No     Chief Complaint  Patient presents with   Acute Visit    Discuss chest xray results and ongoing right shoulder pain. Patent would also like spider bite examined (seen at urgent care recently in [place] for this concern)     HPI:  Pt is a 62 y.o. female seen today for an acute visit to discuss right shoulder X-ray and chest CT scan results.she is here with Husband.    Right shoulder X-ray done 05/29/2023 showed  no evidence of fracture or dislocation. Also no evidence of arthropathy or other focal bone abnormality. She continues to complain of pain with reaching up or stretching hand.Discussed referral to Orthopedic for further evaluation of shoulder pain.   Lung cancer low dose CT scan done 05/29/2023 showed Lung-RADS 1, negative. Annual screening with low-dose chest CT without contrast recommend in 12 months.Aortic atherosclerosis also noted on CT scan.states previously on Atorvastatin but had stopped.reports no side effects to statin.Also not on any ASA or anticoagulant.Discussed restarting atorvastatin and Baby Asprin which she agrees.   Finally mild diffuse bronchial wall thickening with very mild centrilobular and  paraseptal emphysema was noted on CT scan imaging findings suggestive of underlying COPD.Has a significant history of cigarette smoking.On Albuterol rescue inhaler.States gets short of breath whenever she goes up on the stairs.she denies any worsening cough or wheezing.   States was recently seen at [facility] urgent care on 06/18/2023 in [place] for left back of the upper arm possible spider bite. States arm was itchy,swollen,painful and red.Triamcinolone 0.5 % cream,Mupirocin 2% ointment and loratadine 10 mg tablet daily was prescribed.states bite site and swelling has improved but still has small blister area that is not resolved.Has been applying ice compressor.  She denies any fever or chills.   Past Medical History:  Diagnosis Date   Anginal pain (HCC)    11/2011   Asthma    Bipolar 1 disorder (HCC)    Bipolar affective disorder, depressed, mild (HCC)    Remote history of suicidal attempts   Breast cancer (HCC)    In 2005 treated with surgery and Arimidex for 5 years   Depression    Headache(784.0)    h/o migraines    History of pulmonary embolism    Bilateral in 2001   HNP (herniated nucleus pulposus)    2000- L4-5, used pain mgt. prog. in [place]   Hypertension    Shortness of breath    in the past   Past Surgical History:  Procedure Laterality Date   ABDOMINAL HYSTERECTOMY     BREAST LUMPECTOMY     2005   MASTOPEXY  02/07/2012   Procedure: MASTOPEXY;  Surgeon: Wayland Denis, DO;  Location: [facility];  Service: Plastics;  Laterality: Left;  left breast mastopexy/reduction for symmetry after right breast cancer   ORIF ANKLE FRACTURE  06/26/2012   Procedure: OPEN REDUCTION INTERNAL FIXATION (ORIF) ANKLE FRACTURE;  Surgeon: Cammy Copa, MD;  Location[facility];  Service: Orthopedics;  Laterality: Right;   TUBAL LIGATION      Allergies  Allergen Reactions   Gabapentin Swelling   Codeine     REACTION: lethargic   Lamotrigine     REACTION: lips swell    Shellfish Allergy     Some shellfish- is able to eat shrimp but not mussels, clams    Topiramate     REACTION: hallucinations    Outpatient Encounter Medications as of 06/20/2023  Medication Sig   albuterol (PROVENTIL HFA;VENTOLIN HFA) 108 (90 BASE) MCG/ACT inhaler Inhale 2 puffs into the lungs every 6 (six) hours as needed. sob    lisinopril (ZESTRIL) 40 MG tablet Take 40 mg by mouth daily.   loratadine (CLARITIN) 10 MG tablet Take 10 mg by mouth daily.   LORazepam (ATIVAN) 1 MG tablet Take 1 mg by mouth as needed for anxiety.   traZODone (DESYREL) 50 MG tablet Take 50 mg by mouth as needed for sleep.   ARIPiprazole (ABILIFY IM) Inject into the muscle. 30 day injection (Patient not taking: Reported on 06/20/2023)   atomoxetine (STRATTERA) 10 MG capsule Take 10 mg by mouth daily. (Patient not taking: Reported on 06/20/2023)   propranolol (INDERAL) 60 MG tablet Take 60 mg by mouth daily. (Patient not taking: Reported on 06/20/2023)   spironolactone (ALDACTONE) 25 MG tablet Take 25 mg by mouth daily. (Patient not taking: Reported on 06/20/2023)   No facility-administered encounter medications on file as of 06/20/2023.    Review of Systems  Constitutional:  Negative for appetite change, chills, fatigue, fever and unexpected weight change.  Eyes:  Negative for pain, discharge, redness, itching and visual disturbance.  Respiratory:  Negative for cough, chest tightness, shortness of breath and wheezing.   Cardiovascular:  Negative for chest pain, palpitations and leg swelling.  Gastrointestinal:  Negative for abdominal distention, abdominal pain, constipation, diarrhea, nausea and vomiting.  Musculoskeletal:  Negative for arthralgias, back pain, gait problem, joint swelling, myalgias, neck pain and neck stiffness.  Skin:  Negative for color change, pallor, rash and wound.       Left upper arm insect bite   Neurological:  Negative for dizziness, weakness, light-headedness, numbness and headaches.   Hematological:  Does not bruise/bleed easily.    Immunization History  Administered Date(s) Administered   Influenza Split 11/14/2011, 08/27/2012   Influenza,inj,quad, With Preservative 02/18/2019   PFIZER(Purple Top)SARS-COV-2 Vaccination 03/15/2020, 04/05/2020, 01/02/2021   Pertinent  Health Maintenance Due  Topic Date Due   PAP SMEAR-Modifier  Never done   Colonoscopy  Never done   MAMMOGRAM  08/25/2014   INFLUENZA VACCINE  07/11/2023      06/20/2023    2:35 PM  Fall Risk  Falls in the past year? 0  Was there an injury with Fall? 0  Fall Risk Category Calculator 0  Patient at Risk for Falls Due to No Fall Risks  Fall risk Follow up Falls evaluation completed   Functional Status Survey:    Vitals:   06/20/23 1436  BP: 124/80  Pulse: 84  Temp: (!) 97.3 F (36.3 C)  TempSrc: Temporal  SpO2: 97%  Weight: 260 lb (117.9 kg)  Height: 5\' 5"  (1.651 m)   Body mass index is 43.27 kg/m. Physical Exam Vitals reviewed.  Constitutional:      General: She is not in acute distress.    Appearance: Normal appearance. She  is obese. She is not ill-appearing or diaphoretic.  HENT:     Mouth/Throat:     Mouth: Mucous membranes are moist.     Pharynx: Oropharynx is clear. No oropharyngeal exudate or posterior oropharyngeal erythema.  Eyes:     General: No scleral icterus.       Right eye: No discharge.        Left eye: No discharge.     Conjunctiva/sclera: Conjunctivae normal.     Pupils: Pupils are equal, round, and reactive to light.  Neck:     Vascular: No carotid bruit.  Cardiovascular:     Rate and Rhythm: Normal rate and regular rhythm.     Pulses: Normal pulses.     Heart sounds: Normal heart sounds. No murmur heard.    No friction rub. No gallop.  Pulmonary:     Effort: Pulmonary effort is normal. No respiratory distress.     Breath sounds: Normal breath sounds. No wheezing, rhonchi or rales.  Chest:     Chest wall: No tenderness.  Abdominal:     General:  Bowel sounds are normal. There is no distension.     Palpations: Abdomen is soft. There is no mass.     Tenderness: There is no abdominal tenderness. There is no right CVA tenderness, left CVA tenderness, guarding or rebound.  Musculoskeletal:        General: No swelling or tenderness. Normal range of motion.     Cervical back: Normal range of motion. No rigidity or tenderness.     Right lower leg: No edema.     Left lower leg: No edema.  Lymphadenopathy:     Cervical: No cervical adenopathy.  Skin:    General: Skin is warm and dry.     Coloration: Skin is not pale.     Findings: No bruising, erythema, lesion or rash.     Comments: Left upper arm insect bite site with small blister slight tender without any erythema,drainage or swelling.   Neurological:     Mental Status: She is alert and oriented to person, place, and time.     Cranial Nerves: No cranial nerve deficit.     Sensory: No sensory deficit.     Motor: No weakness.     Coordination: Coordination normal.     Gait: Gait normal.  Psychiatric:        Mood and Affect: Mood normal.        Speech: Speech normal.        Behavior: Behavior normal.     Labs reviewed: Recent Labs    06/12/23 0905  NA 140  K 4.1  CL 105  CO2 24  GLUCOSE 103*  BUN 14  CREATININE 0.77  CALCIUM 9.1   Recent Labs    06/12/23 0905  AST 12  ALT 16  BILITOT 0.5  PROT 6.9   Recent Labs    06/12/23 0905  WBC 3.4*  NEUTROABS 1,448*  HGB 12.8  HCT 38.2  MCV 85.3  PLT 200   Lab Results  Component Value Date   TSH 1.21 06/12/2023   No results found for: "HGBA1C" Lab Results  Component Value Date   CHOL 282 (H) 06/12/2023   HDL 55 06/12/2023   LDLCALC 202 (H) 06/12/2023   TRIG 112 06/12/2023   CHOLHDL 5.1 (H) 06/12/2023    Significant Diagnostic Results in last 30 days:  CT CHEST LUNG CA SCREEN LOW DOSE W/O CM  Result Date: 06/03/2023 CLINICAL DATA:  62 year old  female former smoker (quit in 2011) with 20 pack-year  history of smoking. Lung cancer screening examination. EXAM: CT CHEST WITHOUT CONTRAST LOW-DOSE FOR LUNG CANCER SCREENING TECHNIQUE: Multidetector CT imaging of the chest was performed following the standard protocol without IV contrast. RADIATION DOSE REDUCTION: This exam was performed according to the departmental dose-optimization program which includes automated exposure control, adjustment of the mA and/or kV according to patient size and/or use of iterative reconstruction technique. COMPARISON:  Chest CTA 11/13/2011. FINDINGS: Cardiovascular: Heart size is normal. There is no significant pericardial fluid, thickening or pericardial calcification. Atherosclerotic calcifications in the thoracic aorta. No definite coronary artery calcifications. Mediastinum/Nodes: No pathologically enlarged mediastinal or hilar lymph nodes. Please note that accurate exclusion of hilar adenopathy is limited on noncontrast CT scans. Esophagus is unremarkable in appearance. No axillary lymphadenopathy. Surgical clips in the right axillary region from prior lymph node dissection. Lungs/Pleura: Tiny calcified granuloma in the left lower lobe. No other suspicious appearing pulmonary nodules or masses are noted. No acute consolidative airspace disease. No pleural effusions. Mild diffuse bronchial wall thickening with very mild centrilobular and paraseptal emphysema. Upper Abdomen: Aortic atherosclerosis. Numerous colonic diverticuli are noted in the visualized transverse colon. Musculoskeletal: Postoperative changes of lumpectomy in the right breast. There are no aggressive appearing lytic or blastic lesions noted in the visualized portions of the skeleton. IMPRESSION: 1. Lung-RADS 1, negative. Continue annual screening with low-dose chest CT without contrast in 12 months. 2. Aortic atherosclerosis. 3. Mild diffuse bronchial wall thickening with very mild centrilobular and paraseptal emphysema; imaging findings suggestive of underlying  COPD. Aortic Atherosclerosis (ICD10-I70.0) and Emphysema (ICD10-J43.9). Electronically Signed   By: Trudie Reed M.D.   On: 06/03/2023 10:33   DG Shoulder Right  Result Date: 05/31/2023 CLINICAL DATA:  Pain EXAM: RIGHT SHOULDER - 2+ VIEW COMPARISON:  None Available. FINDINGS: There is no evidence of fracture or dislocation. There is no evidence of arthropathy or other focal bone abnormality. Soft tissues are unremarkable. IMPRESSION: Negative. Electronically Signed   By: Gerome Sam III M.D.   On: 05/31/2023 15:06    Assessment/Plan 1. Acute pain of right shoulder X-ray done 05/29/2023 was negative for dislocation,fracture or arthropathy.results discussed and reviewed during visit. - continue on OTC analgesic  - will refer to orthopedic for further evaluation due persistent pain with ROM raising arm above head and rotation.  - Ambulatory referral to Orthopedic Surgery  2. Hyperlipidemia LDL goal <100 Chol 282,TRG 112 and LDL 202 had stopped atorvastatin by herself.Discussed restarting medication which she agrees. - start on atorvastatin as below.  -Dietary modification and exercise advised.  Recommended water aerobics or swimming due to inability to walk secondary to knee pain. - atorvastatin (LIPITOR) 10 MG tablet; Take 1 tablet (10 mg total) by mouth daily.  Dispense: 90 tablet; Refill: 3 - Lipid panel; Future  3. Atherosclerosis of aorta (HCC) Noted by chest CT scan done 05/29/2023.  Results reviewed and discussed at this visit recommend starting on atorvastatin and aspirin.  Agrees to plan.  Samples of Bayer aspirin 81 mg tablet given. - atorvastatin (LIPITOR) 10 MG tablet; Take 1 tablet (10 mg total) by mouth daily.  Dispense: 90 tablet; Refill: 3 - aspirin EC 81 MG tablet; Take 1 tablet (81 mg total) by mouth daily. Swallow whole. - Lipid panel; Future - COMPLETE METABOLIC PANEL WITH GFR; Future - CBC with Differential/Platelet; Future  4. Centrilobular emphysema (HCC) Noted  on CT scan done 05/29/2023 has significant history of cigarette smoking.  No worsening  cough or wheezing reported. -Continue on albuterol -Will add Advair will send to pulmonary if symptoms worsen - COMPLETE METABOLIC PANEL WITH GFR; Future - CBC with Differential/Platelet; Future  5. Insect bite of left upper extremity, subsequent encounter Afebrile - Left upper arm insect bite site with small blister slight tender without any erythema,drainage or swelling.  -Continue on triamcinolone, loratadine and Mupirocin 2% ointment as prescribed in the urgent care.  Pitted -Continue to monitor for any signs of infection and notify provider if symptoms worsen.  Family/ staff Communication: Reviewed plan of care with patient and husband verbalized understanding  Labs/tests ordered:  - COMPLETE METABOLIC PANEL WITH GFR; Future - CBC with Differential/Platelet; Future - Lipid panel; Future  Next Appointment: Return in about 4 months (around 10/21/2023) for medical mangement of chronic issues., fasting labs prior to visit.   Caesar Bookman, NP

## 2023-06-27 ENCOUNTER — Ambulatory Visit (HOSPITAL_BASED_OUTPATIENT_CLINIC_OR_DEPARTMENT_OTHER): Payer: BC Managed Care – PPO | Admitting: Physician Assistant

## 2023-06-27 ENCOUNTER — Encounter (HOSPITAL_BASED_OUTPATIENT_CLINIC_OR_DEPARTMENT_OTHER): Payer: Self-pay | Admitting: Physician Assistant

## 2023-06-27 DIAGNOSIS — M25511 Pain in right shoulder: Secondary | ICD-10-CM

## 2023-06-27 MED ORDER — LIDOCAINE HCL 1 % IJ SOLN
2.0000 mL | INTRAMUSCULAR | Status: AC | PRN
Start: 2023-06-27 — End: 2023-06-27
  Administered 2023-06-27: 2 mL

## 2023-06-27 MED ORDER — METHYLPREDNISOLONE ACETATE 40 MG/ML IJ SUSP
80.0000 mg | INTRAMUSCULAR | Status: AC | PRN
Start: 2023-06-27 — End: 2023-06-27
  Administered 2023-06-27: 80 mg via INTRA_ARTICULAR

## 2023-06-27 MED ORDER — MELOXICAM 7.5 MG PO TABS
7.5000 mg | ORAL_TABLET | Freq: Every day | ORAL | 0 refills | Status: DC
Start: 2023-06-27 — End: 2023-09-02

## 2023-06-27 MED ORDER — BUPIVACAINE HCL 0.25 % IJ SOLN
2.0000 mL | INTRAMUSCULAR | Status: AC | PRN
Start: 2023-06-27 — End: 2023-06-27
  Administered 2023-06-27: 2 mL via INTRA_ARTICULAR

## 2023-06-27 NOTE — Progress Notes (Signed)
Office Visit Note   Patient: [name]           Date of Birth: June 08, 1961           MRN: [id number] Visit Date: 06/27/2023              Requested by: Caesar Bookman, NP [address] [place],  [place] [address] PCP: Caesar Bookman, NP   Assessment & Plan: Visit Diagnoses:  1. Acute pain of right shoulder     Plan: Patient is a [name] 62 year old woman who comes in today with a 5-week history of right shoulder pain.  Denies any injuries or change in activities.  She simply said she awoke with pain in her right shoulder denies any neck pain but does have some pain that radiates from her shoulder down her arm.  Her neck exam is benign.  Her shoulder exam she has significant pain with forward elevation internal rotation behind her back she does have positive impingement findings including an empty can test no ecchymosis ecchymosis or acute fractures noted.  Given her history findings most consistent with rotator cuff tendinitis.  She does have an appointment to see Dr. Steward Drone next week but came in today hoping she should get some relief from her pain prior to that visit.  I talked about trying a subacromial injection she like to go forward with this today we talked about the risks and the benefits.  She is taking a lot of Aleve and Advil she will stop this and I will prescribe her some meloxicam.  Also talked about Voltaren gel.  I am concerned that she is starting to develop a little bit of adhesive capsulitis though difficult to discern since she has so much pain with range of motion   Follow-Up Instructions: No follow-ups on file.   Orders:  No orders of the defined types were placed in this encounter.  Meds ordered this encounter  Medications   meloxicam (MOBIC) 7.5 MG tablet    Sig: Take 1 tablet (7.5 mg total) by mouth daily.    Dispense:  30 tablet    Refill:  0      Procedures: Large Joint Inj: R subacromial bursa on 06/27/2023 11:50 AM Indications:  diagnostic evaluation and pain Details: 25 G 1.5 in needle  Arthrogram: No  Medications: 80 mg methylPREDNISolone acetate 40 MG/ML; 2 mL lidocaine 1 %; 2 mL bupivacaine 0.25 % Outcome: tolerated well, no immediate complications Procedure, treatment alternatives, risks and benefits explained, specific risks discussed. Consent was given by the patient.     Clinical Data: No additional findings.   Subjective: No chief complaint on file.   HPI [name] 62 year old woman with 5 to 6-week history of right shoulder pain.  Denies any injury or paresthesias or radicular symptoms other than when her shoulder hurts it does shoot down her arm.  No neck pain.  Denies any injury just thought she slept on it wrong.  She had been seen to by her primary care provider who did not find any acute abnormalities she feels like it is getting worse and is gotten worse in the last couple days.  She does have an initial consultation with Dr. Steward Drone this week but was hoping to get some relief as she has been having trouble sleeping.  She is right-hand dominant.  She is prediabetic Review of Systems  All other systems reviewed and are negative.    Objective: Vital Signs: There were no vitals taken for  this visit.  Physical Exam Constitutional:      Appearance: Normal appearance.  Pulmonary:     Effort: Pulmonary effort is normal.  Abdominal:     General: Abdomen is flat.  Skin:    General: Skin is warm and dry.  Neurological:     Mental Status: She is alert.  Psychiatric:        Mood and Affect: Mood normal.    Ortho Exam Right shoulder she is neurovascularly intact.  Strong pulses distally.  She has no pain with neck range of motion does not nor does she have any radicular findings.  She can forward elevate to about 100 degrees actively passively this is significantly painful and somewhat stiff.  Internal rotation behind her back is also very difficult and painful.  She hurt her strength with  external and internal rotation is in intact she has a positive empty can test positive speeds test no ecchymosis resisted abduction is fair but limited by pain Specialty Comments:  No specialty comments available.  Imaging: No results found.   PMFS History: Patient Active Problem List   Diagnosis Date Noted   Centrilobular emphysema (HCC) 06/20/2023   Atherosclerosis of aorta (HCC) 06/20/2023   Breast asymmetry 02/07/2012   Morbid obesity (HCC) 01/30/2012   Bipolar affective disorder (HCC) 11/14/2011   Chest pain 11/13/2011   Unspecified asthma(493.90) 11/13/2011   History of pulmonary embolism    HYPERLIPIDEMIA 08/22/2010   DYSPNEA 08/22/2010   CHEST PAIN 08/22/2010   Past Medical History:  Diagnosis Date   Anginal pain (HCC)    11/2011   Asthma    Bipolar 1 disorder (HCC)    Bipolar affective disorder, depressed, mild (HCC)    Remote history of suicidal attempts   Breast cancer (HCC)    In 2005 treated with surgery and Arimidex for 5 years   Depression    Headache(784.0)    h/o migraines    History of pulmonary embolism    Bilateral in 2001   HNP (herniated nucleus pulposus)    2000- L4-5, used pain mgt. prog. in [place]   Hypertension    Personal history of chemotherapy    Personal history of radiation therapy    Shortness of breath    in the past    Family History  Problem Relation Age of Onset   Breast cancer Mother    Prostate cancer Father    Diabetes Maternal Aunt    Cancer Maternal Aunt    Parkinson's disease Maternal Grandmother    Breast cancer Paternal Grandmother    Dementia Paternal Grandmother    Cancer Paternal Grandfather    Breast cancer Cousin    Heart attack Brother     Past Surgical History:  Procedure Laterality Date   ABDOMINAL HYSTERECTOMY     BREAST BIOPSY Left 12/2020   BREAST LUMPECTOMY Right    2005   MASTOPEXY  02/07/2012   Procedure: MASTOPEXY;  Surgeon: Wayland Denis, DO;  Location: [facility];  Service:  Plastics;  Laterality: Left;  left breast mastopexy/reduction for symmetry after right breast cancer   ORIF ANKLE FRACTURE  06/26/2012   Procedure: OPEN REDUCTION INTERNAL FIXATION (ORIF) ANKLE FRACTURE;  Surgeon: Cammy Copa, MD;  Location: 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

## 2023-07-04 ENCOUNTER — Ambulatory Visit (INDEPENDENT_AMBULATORY_CARE_PROVIDER_SITE_OTHER): Payer: BC Managed Care – PPO | Admitting: Student

## 2023-07-04 ENCOUNTER — Encounter (HOSPITAL_BASED_OUTPATIENT_CLINIC_OR_DEPARTMENT_OTHER): Payer: Self-pay | Admitting: Student

## 2023-07-04 ENCOUNTER — Ambulatory Visit (HOSPITAL_BASED_OUTPATIENT_CLINIC_OR_DEPARTMENT_OTHER): Payer: BC Managed Care – PPO | Admitting: Orthopaedic Surgery

## 2023-07-04 DIAGNOSIS — M25511 Pain in right shoulder: Secondary | ICD-10-CM | POA: Diagnosis not present

## 2023-07-04 DIAGNOSIS — M7501 Adhesive capsulitis of right shoulder: Secondary | ICD-10-CM

## 2023-07-04 MED ORDER — TRIAMCINOLONE ACETONIDE 40 MG/ML IJ SUSP
2.0000 mL | INTRAMUSCULAR | Status: AC | PRN
Start: 2023-07-04 — End: 2023-07-04
  Administered 2023-07-04: 2 mL via INTRA_ARTICULAR

## 2023-07-04 MED ORDER — LIDOCAINE HCL 1 % IJ SOLN
4.0000 mL | INTRAMUSCULAR | Status: AC | PRN
Start: 2023-07-04 — End: 2023-07-04
  Administered 2023-07-04: 4 mL
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
## 2023-07-09 ENCOUNTER — Other Ambulatory Visit: Payer: Self-pay

## 2023-07-09 MED ORDER — LISINOPRIL 40 MG PO TABS: 40.0000 mg | ORAL_TABLET | Freq: Every day | ORAL | 1 refills | Status: DC

## 2023-07-09 NOTE — Telephone Encounter (Signed)
Patient called and states that her medication Lisinopril needs refill. Medication on list but I haven't seen that it's been refilled by PCP Ngetich, Dinah C, NP . Medication pend and sent to PCP Ngetich, Donalee Citrin, NP

## 2023-07-10 DIAGNOSIS — F3132 Bipolar disorder, current episode depressed, moderate: Secondary | ICD-10-CM | POA: Diagnosis not present

## 2023-07-10 DIAGNOSIS — F902 Attention-deficit hyperactivity disorder, combined type: Secondary | ICD-10-CM | POA: Diagnosis not present

## 2023-07-12 ENCOUNTER — Ambulatory Visit (HOSPITAL_BASED_OUTPATIENT_CLINIC_OR_DEPARTMENT_OTHER): Payer: BC Managed Care – PPO | Admitting: Student

## 2023-07-12 NOTE — Telephone Encounter (Signed)
Medication refilled by PCP Ngetich, Nelda Bucks, NP

## 2023-07-22 ENCOUNTER — Ambulatory Visit (INDEPENDENT_AMBULATORY_CARE_PROVIDER_SITE_OTHER): Payer: BC Managed Care – PPO | Admitting: Student

## 2023-07-22 ENCOUNTER — Encounter (HOSPITAL_BASED_OUTPATIENT_CLINIC_OR_DEPARTMENT_OTHER): Payer: Self-pay | Admitting: Student

## 2023-07-22 ENCOUNTER — Ambulatory Visit (HOSPITAL_BASED_OUTPATIENT_CLINIC_OR_DEPARTMENT_OTHER): Payer: BC Managed Care – PPO | Admitting: Student

## 2023-07-22 VITALS — BP 138/85 | HR 78 | Ht 65.0 in | Wt 255.0 lb

## 2023-07-22 DIAGNOSIS — R41840 Attention and concentration deficit: Secondary | ICD-10-CM

## 2023-07-22 DIAGNOSIS — M25511 Pain in right shoulder: Secondary | ICD-10-CM

## 2023-07-22 DIAGNOSIS — M7501 Adhesive capsulitis of right shoulder: Secondary | ICD-10-CM | POA: Diagnosis not present

## 2023-07-22 DIAGNOSIS — F319 Bipolar disorder, unspecified: Secondary | ICD-10-CM | POA: Insufficient documentation

## 2023-07-22 DIAGNOSIS — F902 Attention-deficit hyperactivity disorder, combined type: Secondary | ICD-10-CM | POA: Diagnosis not present

## 2023-07-22 DIAGNOSIS — F3132 Bipolar disorder, current episode depressed, moderate: Secondary | ICD-10-CM | POA: Diagnosis not present

## 2023-07-22 DIAGNOSIS — E119 Type 2 diabetes mellitus without complications: Secondary | ICD-10-CM | POA: Diagnosis not present

## 2023-07-22 MED ORDER — LIDOCAINE HCL 1 % IJ SOLN
4.0000 mL | INTRAMUSCULAR | Status: AC | PRN
Start: 2023-07-22 — End: 2023-07-22
  Administered 2023-07-22: 4 mL

## 2023-07-22 MED ORDER — TRIAMCINOLONE ACETONIDE 40 MG/ML IJ SUSP
2.0000 mL | INTRAMUSCULAR | Status: AC | PRN
Start: 2023-07-22 — End: 2023-07-22
  Administered 2023-07-22: 2 mL via INTRA_ARTICULAR

## 2023-07-22 MED ORDER — ATOMOXETINE HCL 10 MG PO CAPS
10.0000 mg | ORAL_CAPSULE | Freq: Every day | ORAL | 1 refills | Status: DC
Start: 2023-08-05 — End: 2024-02-17

## 2023-07-22 MED ORDER — ABILIFY MAINTENA 400 MG IM PRSY
400.0000 mg | PREFILLED_SYRINGE | INTRAMUSCULAR | 1 refills | Status: DC
Start: 2023-07-22 — End: 2023-09-20
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
## 2023-07-23 ENCOUNTER — Ambulatory Visit (HOSPITAL_BASED_OUTPATIENT_CLINIC_OR_DEPARTMENT_OTHER): Payer: BC Managed Care – PPO

## 2023-07-23 ENCOUNTER — Encounter (HOSPITAL_COMMUNITY): Payer: Self-pay

## 2023-07-23 ENCOUNTER — Other Ambulatory Visit: Payer: Self-pay

## 2023-07-23 VITALS — BP 156/100 | HR 91 | Ht 65.0 in | Wt 255.0 lb

## 2023-07-23 DIAGNOSIS — F319 Bipolar disorder, unspecified: Secondary | ICD-10-CM | POA: Diagnosis not present

## 2023-07-23 MED ORDER — ARIPIPRAZOLE ER 400 MG IM PRSY
400.0000 mg | PREFILLED_SYRINGE | INTRAMUSCULAR | Status: DC
Start: 2023-07-23 — End: 2023-09-20
  Administered 2023-07-23: 400 mg via INTRAMUSCULAR

## 2023-07-23 NOTE — Progress Notes (Signed)
Patient presents today for her first injection of Abilify Maintena 400 mg. Patient is well groomed and pleasant, appropriate affect. Patient did not have any questions for me, she has been on this injection before and was very happy to be starting it again. Patient denies any SI/HI or AVH. The injection was prepared as ordered and given in patients left deltoid per her request. Patient tolerated well and without complaint, she will return in 28 days for her next due injection   NDC: 59148-072-92 LOT: [id number] EXP: DEC 2026

## 2023-08-19 ENCOUNTER — Ambulatory Visit (HOSPITAL_BASED_OUTPATIENT_CLINIC_OR_DEPARTMENT_OTHER): Payer: BC Managed Care – PPO

## 2023-08-19 ENCOUNTER — Encounter (HOSPITAL_COMMUNITY): Payer: Self-pay

## 2023-08-19 VITALS — BP 137/86 | HR 100 | Ht 65.0 in | Wt 250.0 lb

## 2023-08-19 DIAGNOSIS — F319 Bipolar disorder, unspecified: Secondary | ICD-10-CM | POA: Diagnosis not present

## 2023-08-19 NOTE — Progress Notes (Signed)
Patient presents today for her due injection of Abilify Maintena 400 mg. She is a day early as she could not come tomorrow. Patient is coming from work, she is well groomed with appropriate affect. Patient denies any SI?HI or AVH. She said she feels like she may be getting manic, patient goes to bed at 10-10:30 at night and recently she has been waking up at 3:30 am. I will send a message to her doctor about this. Injection was prepared as ordered and given in patients left deltoid. Patient tolerated well and without complaint.   NDC: 86578-469-62 LOT: [id number] EXP: GMW 1027

## 2023-08-20 ENCOUNTER — Ambulatory Visit (HOSPITAL_COMMUNITY): Payer: BC Managed Care – PPO

## 2023-09-02 ENCOUNTER — Ambulatory Visit (HOSPITAL_BASED_OUTPATIENT_CLINIC_OR_DEPARTMENT_OTHER): Payer: BC Managed Care – PPO | Admitting: Student

## 2023-09-02 VITALS — BP 149/83 | Wt 253.0 lb

## 2023-09-02 DIAGNOSIS — G2571 Drug induced akathisia: Secondary | ICD-10-CM | POA: Diagnosis not present

## 2023-09-02 DIAGNOSIS — R41840 Attention and concentration deficit: Secondary | ICD-10-CM | POA: Diagnosis not present

## 2023-09-02 DIAGNOSIS — F902 Attention-deficit hyperactivity disorder, combined type: Secondary | ICD-10-CM | POA: Diagnosis not present

## 2023-09-02 DIAGNOSIS — F319 Bipolar disorder, unspecified: Secondary | ICD-10-CM | POA: Diagnosis not present

## 2023-09-02 DIAGNOSIS — F3132 Bipolar disorder, current episode depressed, moderate: Secondary | ICD-10-CM | POA: Diagnosis not present

## 2023-09-02 MED ORDER — PROPRANOLOL HCL 10 MG PO TABS
10.0000 mg | ORAL_TABLET | Freq: Two times a day (BID) | ORAL | 2 refills | Status: AC
Start: 2023-09-02 — End: ?

## 2023-09-02 MED ORDER — LITHIUM CARBONATE 300 MG PO TABS
300.0000 mg | ORAL_TABLET | Freq: Every day | ORAL | 2 refills | Status: AC
Start: 2023-09-02 — End: ?

## 2023-09-03 ENCOUNTER — Telehealth: Payer: BC Managed Care – PPO | Admitting: Family

## 2023-09-03 ENCOUNTER — Telehealth (HOSPITAL_COMMUNITY): Payer: Self-pay

## 2023-09-03 DIAGNOSIS — G2571 Drug induced akathisia: Secondary | ICD-10-CM | POA: Insufficient documentation

## 2023-09-03 DIAGNOSIS — R41840 Attention and concentration deficit: Secondary | ICD-10-CM | POA: Insufficient documentation

## 2023-09-03 MED ORDER — AMLODIPINE BESYLATE 5 MG PO TABS: 5.0000 mg | ORAL_TABLET | Freq: Every day | ORAL | 5 refills | Status: DC

## 2023-09-03 NOTE — Telephone Encounter (Signed)
Fax received from patients pharmacy indicating that patient takes Lisinopril and there is a major interaction for Lithium toxicity. Please contact the pharmacy at [phone]

## 2023-09-03 NOTE — Progress Notes (Signed)
BH MD Outpatient Progress Note  09/03/2023 5:43 PM [name]  MRN:  [id number]  Assessment:  [name] [facility] presents for follow-up evaluation.  The patient has a history of bipolar affective disorder 1, as well as alcohol use disorder, with many previous psychiatric hospitalizations.  It appears that the patient has not had a hospitalization in several years and had been doing relatively well on Abilify.  Today the patient reports signs and symptoms consistent with akathisia, approximately 2 months since beginning her Abilify injections.  She also reports the emergence of mild hypomanic symptoms.  Much care was put into the patient's treatment plan.  The patient reports previous success with lithium and tolerating the medication well.  The patient is on lisinopril, so we coordinated with the patient's primary care physician to have her discontinue this medication and start amlodipine, which can be safely prescribed with lithium.  I will plan to follow-up with the patient via phone in 1 week, and at that point we can start lithium 300 mg twice daily, obtaining a level 5 days later, and we can also start propranolol for the patient's akathisia.  (The patient has taken propranolol previously, 60 mg extended release, for blood pressure).  For now, the patient continues to have a steady level of Abilify Maintena, since she received the shot on 9/9.  The patient feels that her side effect burden and her minor hypomanic symptoms can safely wait 1 week to be further addressed with medications.  Identifying Information: [name] is a 62 y.o. y.o. female with a history of bipolar affective disorder and alcohol use disorder, in sustained remission who is an established patient with [facility] for management of bipolar affective disorder.   Plan:  # Bipolar affective disorder 1, history of numerous psychiatric hospitalizations, currently in partial  remission Interventions: -- Continue Abilify Maintena 400 mg q28 days, can continue this medication if propranolol is effective for akathisia, will be a good combination with the addition of lithium - Patient to discontinue lisinopril for hypertension and begin amlodipine, discussed this with the patient via phone, and the patient expressed her understanding - Will plan to start lithium 300 mg twice daily next week for mood symptoms, as well as propranolol 10 mg twice daily for akathisia. (Please note the prescriptions were sent in for these medications, but the patient has been clearly told to not begin taking them until our phone call next week.  The patient expressed her understanding). - Creatinine within normal limits and TSH within normal limits - The patient has been counseled on side effects of lithium and lithium toxicity - Will counsel the patient on avoiding NSAIDs while using lithium - Patient is scheduled to continue therapy with the [facility]  - Can consider Remeron should propranolol turn out to be ineffective for akathisia   # Inattention, self-reported diagnosis of ADHD Interventions: -- Continue Strattera 10 mg daily   Patient was given contact information for behavioral health clinic and was instructed to call 911 for emergencies.   Subjective:  Chief Complaint:  Chief Complaint  Patient presents with   Follow-up    Interval History:  The patient is accompanied by her husband today, [name].  She reports experiencing several hours of "feeling antsy and and squirrely" approximately 2 times per week.  She says that this sensation is very frustrating, and consists of a strong desire to move around and do something active.  She says that it is very difficult to  stay seated during this period of time. The patient denies taking any oral Abilify.   The patient reports that she has been doing well overall, functioning well at work and feeling relatively stable.   Unfortunately, the patient reports experiencing some residual hypomanic symptoms, such as occasional insomnia, in which sleep is replaced by increased goal-directed activity.  She denies experiencing any grandiosity or impulsivity.  On my assessment there is no rapid or pressured speech.  The patient appears calm and has a linear and logical thought process.  The patient denies experiencing any depressive symptoms and states that she has no hopelessness or suicidal thoughts that she has experienced.     Visit Diagnosis:    ICD-10-CM   1. Bipolar 1 disorder (HCC)  F31.9 lithium 300 MG tablet    2. Inattention  R41.840     3. Akathisia  G25.71 propranolol (INDERAL) 10 MG tablet      Past Psychiatric History:  From initial evaluation: 62 y.o. female with a history of bipolar affective disorder 1 who presents in person to [facility] for initial evaluation of medication management for bipolar disorder.  Patient reports being diagnosed with manic depressive illness at the age of 82, and having approximately 20 psychiatric hospitalizations until achieving sobriety from alcohol when she was approximately 62 years old.  Since becoming sober, the patient reports having no psychiatric hospitalizations until she decided, in conjunction with her psychiatrist, to taper off of Abilify.  She had a depressive relapse and was admitted to a psychiatric hospital near her home in [place].  She was stabilized on Abilify and given a long-acting injectable of the medication.  She states that she had been doing well since that time and recently moved to [place] because of work obligations.  Unfortunately, she became nonadherent to the injection as a result of having no provider in [place].   Past Medical History:  Past Medical History:  Diagnosis Date   Anginal pain (HCC)    11/2011   Asthma    Bipolar 1 disorder (HCC)    Bipolar affective disorder, depressed, mild (HCC)     Remote history of suicidal attempts   Breast cancer (HCC)    In 2005 treated with surgery and Arimidex for 5 years   Depression    Headache(784.0)    h/o migraines    History of pulmonary embolism    Bilateral in 2001   HNP (herniated nucleus pulposus)    2000- L4-5, used pain mgt. prog. in [place]   Hypertension    Personal history of chemotherapy    Personal history of radiation therapy    Shortness of breath    in the past    Past Surgical History:  Procedure Laterality Date   ABDOMINAL HYSTERECTOMY     BREAST BIOPSY Left 12/2020   BREAST LUMPECTOMY Right    2005   MASTOPEXY  02/07/2012   Procedure: MASTOPEXY;  Surgeon: Wayland Denis, DO;  Location: [facility];  Service: Plastics;  Laterality: Left;  left breast mastopexy/reduction for symmetry after right breast cancer   ORIF ANKLE FRACTURE  06/26/2012   Procedure: OPEN REDUCTION INTERNAL FIXATION (ORIF) ANKLE FRACTURE;  Surgeon: Cammy Copa, MD;  Location[facility];  Service: Orthopedics;  Laterality: Right;   TUBAL LIGATION      Family Psychiatric History: None pertinent  Family History:  Family History  Problem Relation Age of Onset   Breast cancer Mother    Prostate cancer Father  Diabetes Maternal Aunt    Cancer Maternal Aunt    Parkinson's disease Maternal Grandmother    Breast cancer Paternal Grandmother    Dementia Paternal Grandmother    Cancer Paternal Grandfather    Breast cancer Cousin    Heart attack Brother     Social History:  Social History   Socioeconomic History   Marital status: Married    Spouse name: Not on file   Number of children: Not on file   Years of education: Not on file   Highest education level: Not on file  Occupational History   Not on file  Tobacco Use   Smoking status: Former    Current packs/day: 0.00    Types: Cigarettes    Start date: 06/09/1980    Quit date: 06/09/2010    Years since quitting: 13.2   Smokeless tobacco: Never  Vaping Use   Vaping  status: Never Used  Substance and Sexual Activity   Alcohol use: Not Currently    Comment: 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 2      Pets: No      Highest level or education completed: Law school      Current/Past profession: Librarian, academic of Domestic Violence Banker       Exercise: No                 Type and how often:          Living Will: Yes   DNR: Yes   POA/HPOA: Yes      Functional Status:   Do you have difficulty bathing or dressing yourself? No   Do you have difficulty preparing food or eating? No   Do you have difficulty managing your medications? No   Do you have difficulty managing your finances? No   Do you have difficulty affording your medications? No   Social Determinants of Health   Financial Resource Strain: Medium Risk (11/06/2019)   Received from [facility], [facility]   Overall Financial Resource Strain (CARDIA)    Difficulty of Paying Living Expenses: Somewhat hard  Food Insecurity: No Food Insecurity (11/06/2019)   Received from [facility], [facility]   Hunger Vital Sign    Worried About Running Out of Food in the Last Year: Never true    Ran Out of Food in the Last Year: Never true  Transportation Needs: No Transportation Needs (11/06/2019)   Received from [facility], [facility] - Transportation    Lack of Transportation (Medical): No    Lack of Transportation (Non-Medical): No  Physical Activity: Inactive (03/25/2019)   Received from [facility], [facility]   Exercise Vital Sign    Days of Exercise per Week: 0 days    Minutes of Exercise per Session: 0 min  Stress: Stress Concern Present (03/25/2019)   Received from [facility], [facility]  [employer] [facility] Occupational [facility] - Occupational Stress Questionnaire    Feeling of Stress : Very much  Social Connections: Unknown (06/18/2023)   Received from [facility], [facility]   Social Network    Social Network: Not on file    Allergies:  Allergies  Allergen Reactions   Gabapentin Swelling   Codeine     REACTION: lethargic   Lamotrigine     REACTION: lips swell   Shellfish Allergy     Some shellfish- is able to eat shrimp but not mussels, clams    Topiramate     REACTION: hallucinations    Current Medications: Current Outpatient Medications  Medication Sig Dispense Refill   lithium 300 MG tablet Take 1 tablet (300 mg total) by mouth at bedtime. 30 tablet 2   propranolol (INDERAL) 10 MG tablet Take 1 tablet (10 mg total) by mouth 2 (two) times daily. 60 tablet 2   albuterol (PROVENTIL HFA;VENTOLIN HFA) 108 (90 BASE) MCG/ACT inhaler Inhale 2 puffs into the lungs every 6 (six) hours as needed. sob      amLODipine (NORVASC) 5 MG tablet Take 1 tablet (5 mg total) by mouth daily. 30 tablet 5   ARIPiprazole ER (ABILIFY MAINTENA) 400 MG PRSY prefilled syringe Inject 400 mg into the muscle every 28 (twenty-eight) days. 1 each 1   aspirin EC 81 MG tablet Take 1 tablet (81 mg total) by mouth daily. Swallow whole.     atomoxetine (STRATTERA) 10 MG capsule Take 1 capsule (10 mg total) by mouth daily. 30 capsule 1   atorvastatin (LIPITOR) 10 MG tablet Take 1 tablet (10 mg total) by mouth daily. 90 tablet 3   loratadine (CLARITIN) 10 MG tablet Take 10 mg by mouth daily.     spironolactone (ALDACTONE) 25 MG tablet Take 25 mg by mouth daily. (Patient not taking: Reported on 06/20/2023)     Current Facility-Administered Medications  Medication Dose Route  Frequency Provider Last Rate Last Admin   ARIPiprazole ER (ABILIFY MAINTENA) 400 MG prefilled syringe 400 mg  400 mg Intramuscular Q28 days Carlyn Reichert, MD   400 mg at 07/23/23 1652     Objective:  Psychiatric Specialty Exam: Physical Exam Constitutional:      Appearance: the patient is not toxic-appearing.  Pulmonary:     Effort: Pulmonary effort is normal.  Neurological:     General: No focal deficit present.     Mental Status: the patient is alert and oriented to person, place, and time.   Review of Systems  Respiratory:  Negative for shortness of breath.   Cardiovascular:  Negative for chest pain.  Gastrointestinal:  Negative for abdominal pain, constipation, diarrhea, nausea and vomiting.  Neurological:  Negative for headaches.      BP (!) 149/83   Wt 253 lb (114.8 kg)   BMI 42.10 kg/m   General Appearance: Fairly Groomed  Eye Contact:  Good  Speech:  Clear and Coherent  Volume:  Normal  Mood:  Euthymic  Affect:  Congruent  Thought Process:  Coherent  Orientation:  Full (Time, Place, and Person)  Thought Content: Logical   Suicidal Thoughts:  No  Homicidal Thoughts:  No  Memory:  Immediate;   Good  Judgement:  fair  Insight:  fair  Psychomotor Activity:  Normal  Concentration:  Concentration: Good  Recall:  Good  Fund of Knowledge: Good  Language: Good  Akathisia:  No  Handed:    AIMS (if indicated): not done  Assets:  Manufacturing systems engineer  Desire for Improvement Financial Resources/Insurance Housing Leisure Time Physical Health  ADL's:  Intact  Cognition: WNL  Sleep:  Fair     Metabolic Disorder Labs: No results found for: "HGBA1C", "MPG" No results found for: "PROLACTIN" Lab Results  Component Value Date   CHOL 282 (H) 06/12/2023   TRIG 112 06/12/2023   HDL 55 06/12/2023   CHOLHDL 5.1 (H) 06/12/2023   VLDL 26 11/14/2011   LDLCALC 202 (H) 06/12/2023   LDLCALC 82 11/14/2011   Lab Results  Component Value Date   TSH 1.21 06/12/2023    TSH 2.697 11/14/2011    Therapeutic Level Labs: Lab Results  Component Value Date   LITHIUM 0.27 (L) 11/13/2011   LITHIUM 0.26 (L) 01/17/2009   No results found for: "VALPROATE" No results found for: "CBMZ"  Screenings:  Collaboration of Care: none  A total of 30 minutes was spent involved in face to face clinical care, chart review, documentation.   Carlyn Reichert, MD 09/03/2023, 5:43 PM

## 2023-09-03 NOTE — Telephone Encounter (Signed)
Message received from patient's Psychiatry.starting patient on Lithium but cannot take along with Lisinopril.patient has taken amlodipine in the past.please call patient and notify to discontinue Lisinopril then pickup prescription for amlodipine 5 mg tablet one by mouth daily. - Please schedule appointment in 2 weeks to recheck blood pressure.

## 2023-09-03 NOTE — Telephone Encounter (Signed)
Discussed Dinah's message with patient and she verbalized understanding. Appointment scheduled for 09/20/23 to follow-up as patient will not be able to pick up amlodipine until this Friday.

## 2023-09-10 ENCOUNTER — Other Ambulatory Visit (INDEPENDENT_AMBULATORY_CARE_PROVIDER_SITE_OTHER): Payer: BC Managed Care – PPO | Admitting: Student

## 2023-09-10 DIAGNOSIS — F319 Bipolar disorder, unspecified: Secondary | ICD-10-CM

## 2023-09-10 DIAGNOSIS — G2571 Drug induced akathisia: Secondary | ICD-10-CM

## 2023-09-10 DIAGNOSIS — Z79899 Other long term (current) drug therapy: Secondary | ICD-10-CM

## 2023-09-10 MED ORDER — PROPRANOLOL HCL 10 MG PO TABS
10.0000 mg | ORAL_TABLET | Freq: Every day | ORAL | 2 refills | Status: DC
Start: 2023-09-10 — End: 2023-09-20

## 2023-09-10 MED ORDER — LITHIUM CARBONATE 600 MG PO CAPS
600.0000 mg | ORAL_CAPSULE | Freq: Every day | ORAL | 2 refills | Status: DC
Start: 2023-09-10 — End: 2023-09-19

## 2023-09-10 NOTE — Progress Notes (Signed)
Called to discuss the patient's plan of care.  She reports stopping lisinopril 6 days ago.  She reports that she has not started her amlodipine 5 mg daily.  She does report recent issues with gambling, but says these have not been severe.  She is concerned about the side effect of Abilify with gambling.  She reports that symptoms of akathisia have been significant but not intolerable.  Plan: Discussed in detail and agreed upon with the patient - Start lithium 600 mg nightly (patient reports only being able to remember taking medication 1 time a day) - Schedule lab appointment for Tuesday, October 8 at 8 AM - Start propranolol at 10 mg nightly - Start amlodipine 5 mg daily (patient's blood pressure was very elevated and she has previously tolerated propranolol 60 mg daily with other antihypertensive agents, feel that she can easily tolerate starting these 2 medications at the same time). - Will call the patient on Friday, October 11 to follow-up on lithium level - Will consider increase to 900 mg nightly and consider increased dose of propranolol - Patient does not desire to get another Abilify Maintena injection - Discussed the need for the patient to avoid NSAIDs while on lithium.  Described again lithium toxicity and the need to seek emergency care, and the patient agreed.  Carlyn Reichert, MD PGY-3

## 2023-09-17 ENCOUNTER — Other Ambulatory Visit: Payer: Self-pay

## 2023-09-17 ENCOUNTER — Ambulatory Visit (HOSPITAL_COMMUNITY): Payer: BC Managed Care – PPO

## 2023-09-17 VITALS — BP 132/80 | HR 82 | Ht 65.0 in | Wt 254.0 lb

## 2023-09-17 DIAGNOSIS — F319 Bipolar disorder, unspecified: Secondary | ICD-10-CM

## 2023-09-17 DIAGNOSIS — Z79899 Other long term (current) drug therapy: Secondary | ICD-10-CM | POA: Diagnosis not present

## 2023-09-17 NOTE — Progress Notes (Signed)
Patient arrived today for her due Lithium Level. Patient is a cancer survivor and has had chemotherapy in the past. We were unable to collect the blood. I did not have any butterfly needles unfortunately. We tried once in the Left [facility] and missed. I printed patients orders and had her go to [facility].

## 2023-09-18 LAB — LITHIUM LEVEL: Lithium Lvl: 0.2 mmol/L — ABNORMAL LOW (ref 0.5–1.2)

## 2023-09-19 ENCOUNTER — Other Ambulatory Visit (HOSPITAL_COMMUNITY): Payer: Self-pay | Admitting: Student

## 2023-09-19 DIAGNOSIS — F319 Bipolar disorder, unspecified: Secondary | ICD-10-CM

## 2023-09-19 MED ORDER — LITHIUM CARBONATE ER 300 MG PO TBCR
900.0000 mg | EXTENDED_RELEASE_TABLET | Freq: Every day | ORAL | 1 refills | Status: DC
Start: 2023-09-19 — End: 2024-02-17

## 2023-09-19 NOTE — Progress Notes (Signed)
Reviewed lab, lithium level was 0.2 after 6 or 7 days on the medication.  Plan to increase the dose from 600 mg nightly to 900 mg nightly.  Called and discussed this with the patient and sent a new prescription.   Carlyn Reichert, MD PGY-3

## 2023-09-20 ENCOUNTER — Encounter: Payer: Self-pay | Admitting: Family

## 2023-09-20 ENCOUNTER — Ambulatory Visit (INDEPENDENT_AMBULATORY_CARE_PROVIDER_SITE_OTHER): Payer: BC Managed Care – PPO | Admitting: Family

## 2023-09-20 VITALS — BP 140/92 | HR 100 | Temp 97.9°F | Resp 18 | Ht 65.0 in | Wt 256.2 lb

## 2023-09-20 DIAGNOSIS — G2571 Drug induced akathisia: Secondary | ICD-10-CM | POA: Diagnosis not present

## 2023-09-20 DIAGNOSIS — F3132 Bipolar disorder, current episode depressed, moderate: Secondary | ICD-10-CM | POA: Diagnosis not present

## 2023-09-20 DIAGNOSIS — I10 Essential (primary) hypertension: Secondary | ICD-10-CM | POA: Diagnosis not present

## 2023-09-20 DIAGNOSIS — F902 Attention-deficit hyperactivity disorder, combined type: Secondary | ICD-10-CM | POA: Diagnosis not present

## 2023-09-20 MED ORDER — PROPRANOLOL HCL 10 MG PO TABS
10.0000 mg | ORAL_TABLET | Freq: Every day | ORAL | 1 refills | Status: DC
Start: 2023-09-20 — End: 2024-02-17

## 2023-09-20 MED ORDER — SPIRONOLACTONE 25 MG PO TABS: 25.0000 mg | ORAL_TABLET | Freq: Every day | ORAL | 1 refills | Status: DC

## 2023-09-20 NOTE — Patient Instructions (Signed)
Book Resources for depression and anxiety:  - Depression the way out by Brien Few  - SOS for anxiety and depression  - Telling Yourself the truth

## 2023-09-20 NOTE — Progress Notes (Signed)
Provider: Richarda Blade FNP-C  Danasia Baker, Donalee Citrin, NP  Patient Care Team: Cyntia Staley, Donalee Citrin, NP as PCP - General (Family Medicine)  Extended Emergency Contact Information Primary Emergency Contact: Rosalie Gums [place] Mobile Phone: [phone] Relation: Spouse Preferred language: English Interpreter needed? No Secondary Emergency Contact: Kelly,Dale  [place] Phone: [phone] Relation: Father Preferred language: English Interpreter needed? No  Code Status:  Full Code  Goals of care: Advanced Directive information    09/20/2023    1:24 PM  Advanced Directives  Does Patient Have a Medical Advance Directive? No     Chief Complaint  Patient presents with   Follow-up    HPI:  Pt is a 62 y.o. female seen today for an acute visit for follow up high blood pressure.states B/p at home runs in the 120's -130's /80's.she denies any headache,dizziness,vision changes,fatigue,chest tightness,palpitation,chest pain or shortness of breath.   States recently bought a dog will be walking twice daily. Has been trying to watch her diet too.      Past Medical History:  Diagnosis Date   Anginal pain (HCC)    11/2011   Asthma    Bipolar 1 disorder (HCC)    Bipolar affective disorder, depressed, mild (HCC)    Remote history of suicidal attempts   Breast cancer (HCC)    In 2005 treated with surgery and Arimidex for 5 years   Depression    Headache(784.0)    h/o migraines    History of pulmonary embolism    Bilateral in 2001   HNP (herniated nucleus pulposus)    2000- L4-5, used pain mgt. prog. in [place]   Hypertension    Personal history of chemotherapy    Personal history of radiation therapy    Shortness of breath    in the past   Past Surgical History:  Procedure Laterality Date   ABDOMINAL HYSTERECTOMY     BREAST BIOPSY Left 12/2020   BREAST LUMPECTOMY Right    2005   MASTOPEXY  02/07/2012   Procedure: MASTOPEXY;   Surgeon: Wayland Denis, DO;  Location: [facility];  Service: Plastics;  Laterality: Left;  left breast mastopexy/reduction for symmetry after right breast cancer   ORIF ANKLE FRACTURE  06/26/2012   Procedure: OPEN REDUCTION INTERNAL FIXATION (ORIF) ANKLE FRACTURE;  Surgeon: Cammy Copa, MD;  Location: [facility] OR;  Service: Orthopedics;  Laterality: Right;   TUBAL LIGATION      Allergies  Allergen Reactions   Gabapentin Swelling   Codeine     REACTION: lethargic   Lamotrigine     REACTION: lips swell   Shellfish Allergy     Some shellfish- is able to eat shrimp but not mussels, clams    Topiramate     REACTION: hallucinations    Outpatient Encounter Medications as of 09/20/2023  Medication Sig   albuterol (PROVENTIL HFA;VENTOLIN HFA) 108 (90 BASE) MCG/ACT inhaler Inhale 2 puffs into the lungs every 6 (six) hours as needed. sob    amLODipine (NORVASC) 5 MG tablet Take 1 tablet (5 mg total) by mouth daily.   aspirin EC 81 MG tablet Take 1 tablet (81 mg total) by mouth daily. Swallow whole.   atomoxetine (STRATTERA) 10 MG capsule Take 1 capsule (10 mg total) by mouth daily.   atorvastatin (LIPITOR) 10 MG tablet Take 1 tablet (10 mg total) by mouth daily.   lithium carbonate (LITHOBID) 300 MG ER tablet Take 3 tablets (900 mg total) by mouth  at bedtime.   [DISCONTINUED] ARIPiprazole ER (ABILIFY MAINTENA) 400 MG PRSY prefilled syringe Inject 400 mg into the muscle every 28 (twenty-eight) days.   [DISCONTINUED] loratadine (CLARITIN) 10 MG tablet Take 10 mg by mouth daily.   propranolol (INDERAL) 10 MG tablet Take 1 tablet (10 mg total) by mouth daily with supper. (Patient not taking: Reported on 09/20/2023)   spironolactone (ALDACTONE) 25 MG tablet Take 25 mg by mouth daily. (Patient not taking: Reported on 09/20/2023)   [DISCONTINUED] ARIPiprazole ER (ABILIFY MAINTENA) 400 MG prefilled syringe 400 mg    No facility-administered encounter medications on file as of  09/20/2023.    Review of Systems  Constitutional:  Negative for appetite change, chills, fatigue, fever and unexpected weight change.  HENT:  Negative for congestion, dental problem, ear discharge, ear pain, facial swelling, hearing loss, nosebleeds, postnasal drip, rhinorrhea, sinus pressure, sinus pain, sneezing, sore throat, tinnitus and trouble swallowing.   Eyes:  Negative for pain, discharge, redness, itching and visual disturbance.  Respiratory:  Negative for cough, chest tightness, shortness of breath and wheezing.   Cardiovascular:  Negative for chest pain, palpitations and leg swelling.  Gastrointestinal:  Negative for abdominal distention, abdominal pain, blood in stool, constipation, diarrhea, nausea and vomiting.  Genitourinary:  Negative for difficulty urinating, dysuria, flank pain, frequency and urgency.  Musculoskeletal:  Negative for arthralgias, back pain, gait problem, joint swelling and myalgias.  Skin:  Negative for color change, pallor and rash.  Neurological:  Negative for dizziness, syncope, speech difficulty, weakness, light-headedness, numbness and headaches.    Immunization History  Administered Date(s) Administered   Influenza Split 11/14/2011, 08/27/2012   Influenza,inj,quad, With Preservative 02/18/2019   PFIZER(Purple Top)SARS-COV-2 Vaccination 03/15/2020, 04/05/2020, 01/02/2021   Pertinent  Health Maintenance Due  Topic Date Due   Colonoscopy  Never done   MAMMOGRAM  08/25/2014   INFLUENZA VACCINE  07/11/2023      06/20/2023    2:35 PM  Fall Risk  Falls in the past year? 0  Was there an injury with Fall? 0  Fall Risk Category Calculator 0  Patient at Risk for Falls Due to No Fall Risks  Fall risk Follow up Falls evaluation completed   Functional Status Survey:    Vitals:   09/20/23 1325  BP: (!) 140/92  Pulse: 100  Resp: 18  Temp: 97.9 F (36.6 C)  SpO2: 98%  Weight: 256 lb 3.2 oz (116.2 kg)  Height: 5\' 5"  (1.651 m)   Body mass index  is 42.63 kg/m. Physical Exam Vitals reviewed.  Constitutional:      General: She is not in acute distress.    Appearance: Normal appearance. She is normal weight. She is not ill-appearing or diaphoretic.  HENT:     Head: Normocephalic.     Right Ear: Tympanic membrane, ear canal and external ear normal. There is no impacted cerumen.     Left Ear: Tympanic membrane, ear canal and external ear normal. There is no impacted cerumen.     Nose: Nose normal. No congestion or rhinorrhea.     Mouth/Throat:     Mouth: Mucous membranes are moist.     Pharynx: Oropharynx is clear. No oropharyngeal exudate or posterior oropharyngeal erythema.  Eyes:     General: No scleral icterus.       Right eye: No discharge.        Left eye: No discharge.     Extraocular Movements: Extraocular movements intact.     Conjunctiva/sclera: Conjunctivae normal.  Pupils: Pupils are equal, round, and reactive to light.  Neck:     Vascular: No carotid bruit.  Cardiovascular:     Rate and Rhythm: Normal rate and regular rhythm.     Pulses: Normal pulses.     Heart sounds: Normal heart sounds. No murmur heard.    No friction rub. No gallop.  Pulmonary:     Effort: Pulmonary effort is normal. No respiratory distress.     Breath sounds: Normal breath sounds. No wheezing, rhonchi or rales.  Chest:     Chest wall: No tenderness.  Abdominal:     General: Bowel sounds are normal. There is no distension.     Palpations: Abdomen is soft. There is no mass.     Tenderness: There is no abdominal tenderness. There is no right CVA tenderness, left CVA tenderness, guarding or rebound.  Musculoskeletal:        General: No swelling or tenderness. Normal range of motion.     Cervical back: Normal range of motion. No rigidity or tenderness.     Right lower leg: No edema.     Left lower leg: No edema.  Lymphadenopathy:     Cervical: No cervical adenopathy.  Skin:    General: Skin is warm and dry.     Coloration: Skin is  not pale.     Findings: No bruising, erythema, lesion or rash.  Neurological:     Mental Status: She is alert and oriented to person, place, and time.     Cranial Nerves: No cranial nerve deficit.     Sensory: No sensory deficit.     Motor: No weakness.     Coordination: Coordination normal.     Gait: Gait normal.  Psychiatric:        Mood and Affect: Mood normal.        Speech: Speech normal.        Behavior: Behavior normal.     Labs reviewed: Recent Labs    06/12/23 0905  NA 140  K 4.1  CL 105  CO2 24  GLUCOSE 103*  BUN 14  CREATININE 0.77  CALCIUM 9.1   Recent Labs    06/12/23 0905  AST 12  ALT 16  BILITOT 0.5  PROT 6.9   Recent Labs    06/12/23 0905  WBC 3.4*  NEUTROABS 1,448*  HGB 12.8  HCT 38.2  MCV 85.3  PLT 200   Lab Results  Component Value Date   TSH 1.21 06/12/2023   No results found for: "HGBA1C" Lab Results  Component Value Date   CHOL 282 (H) 06/12/2023   HDL 55 06/12/2023   LDLCALC 202 (H) 06/12/2023   TRIG 112 06/12/2023   CHOLHDL 5.1 (H) 06/12/2023    Significant Diagnostic Results in last 30 days:  No results found.  Assessment/Plan 1. Akathisia Continue  on propranolol - propranolol (INDERAL) 10 MG tablet; Take 1 tablet (10 mg total) by mouth daily with supper.  Dispense: 90 tablet; Refill: 1  2. Essential hypertension B/p not at goal this visit but her home readings are within normal range. -Will have her monitor her blood pressure at home if greater than 140 to notify provider.  Consider increasing amlodipine to 10 mg daily -Dietary modification and exercise at least 3 times a week for 30 minutes -Continue on spironolactone, propranolol and amlodipine  Family/ staff Communication: Reviewed plan of care with patient verbalized understanding  Labs/tests ordered: None   Next Appointment: Return if symptoms worsen or  fail to improve.   Caesar Bookman, NP

## 2023-10-09 ENCOUNTER — Ambulatory Visit (HOSPITAL_COMMUNITY): Payer: BC Managed Care – PPO | Admitting: Student

## 2023-10-10 DIAGNOSIS — F902 Attention-deficit hyperactivity disorder, combined type: Secondary | ICD-10-CM | POA: Diagnosis not present

## 2023-10-10 DIAGNOSIS — F3132 Bipolar disorder, current episode depressed, moderate: Secondary | ICD-10-CM | POA: Diagnosis not present

## 2023-10-25 ENCOUNTER — Encounter: Payer: Self-pay | Admitting: Nurse Practitioner

## 2023-10-25 ENCOUNTER — Ambulatory Visit (INDEPENDENT_AMBULATORY_CARE_PROVIDER_SITE_OTHER): Payer: BC Managed Care – PPO | Admitting: Nurse Practitioner

## 2023-10-25 VITALS — BP 138/88 | HR 83 | Temp 96.8°F | Resp 20 | Ht 65.0 in | Wt 260.2 lb

## 2023-10-25 DIAGNOSIS — G2571 Drug induced akathisia: Secondary | ICD-10-CM

## 2023-10-25 DIAGNOSIS — I10 Essential (primary) hypertension: Secondary | ICD-10-CM | POA: Insufficient documentation

## 2023-10-25 DIAGNOSIS — F319 Bipolar disorder, unspecified: Secondary | ICD-10-CM

## 2023-10-25 DIAGNOSIS — I159 Secondary hypertension, unspecified: Secondary | ICD-10-CM

## 2023-10-25 DIAGNOSIS — R251 Tremor, unspecified: Secondary | ICD-10-CM | POA: Diagnosis not present

## 2023-10-25 DIAGNOSIS — F909 Attention-deficit hyperactivity disorder, unspecified type: Secondary | ICD-10-CM | POA: Insufficient documentation

## 2023-10-25 MED ORDER — PROPRANOLOL HCL 10 MG PO TABS
10.0000 mg | ORAL_TABLET | Freq: Every day | ORAL | 1 refills | Status: DC | PRN
Start: 2023-10-25 — End: 2024-02-17

## 2023-10-25 NOTE — Progress Notes (Addendum)
Location:   [facility]   Place of Service:   clinic [facility] Provider: Chipper Oman NP  Code Status: DNR Goals of Care:     10/25/2023    3:45 PM  Advanced Directives  Does Patient Have a Medical Advance Directive? No;Yes  Type of Advance Directive Living will  Does patient want to make changes to medical advance directive? No - Patient declined  Would patient like information on creating a medical advance directive? No - Patient declined     Chief Complaint  Patient presents with   Acute Visit    Hand concern    HPI: Patient is a 62 y.o. female seen today for an acute visit for the R hand tremulous on and off, have to use the left hand to hold it down, occurs at speech when holding microphone, at lunch, stated she is not nervous at all.     HTN, takes Amlodipine, Spironolactone  Bipolar, Lithium(off Abilify 2/2 EPS), TSH 1.21 06/12/23, lithium level 0.2 09/17/23  ADHD Atomoxetine  Akathisia on Propranolol  HLD Atorvastatin, LDL 202 06/12/23  Past Medical History:  Diagnosis Date   Anginal pain (HCC)    11/2011   Asthma    Bipolar 1 disorder (HCC)    Bipolar affective disorder, depressed, mild (HCC)    Remote history of suicidal attempts   Breast cancer (HCC)    In 2005 treated with surgery and Arimidex for 5 years   Depression    Headache(784.0)    h/o migraines    History of pulmonary embolism    Bilateral in 2001   HNP (herniated nucleus pulposus)    2000- L4-5, used pain mgt. prog. in [place]   Hypertension    Personal history of chemotherapy    Personal history of radiation therapy    Shortness of breath    in the past    Past Surgical History:  Procedure Laterality Date   ABDOMINAL HYSTERECTOMY     BREAST BIOPSY Left 12/2020   BREAST LUMPECTOMY Right    2005   MASTOPEXY  02/07/2012   Procedure: MASTOPEXY;  Surgeon: Wayland Denis, DO;  Location: [facility];  Service: Plastics;  Laterality: Left;  left breast mastopexy/reduction for symmetry after  right breast cancer   ORIF ANKLE FRACTURE  06/26/2012   Procedure: OPEN REDUCTION INTERNAL FIXATION (ORIF) ANKLE FRACTURE;  Surgeon: Cammy Copa, MD;  Location: [facility] OR;  Service: Orthopedics;  Laterality: Right;   TUBAL LIGATION      Allergies  Allergen Reactions   Gabapentin Swelling   Codeine     REACTION: lethargic   Lamotrigine     REACTION: lips swell   Shellfish Allergy     Some shellfish- is able to eat shrimp but not mussels, clams    Topiramate     REACTION: hallucinations    Allergies as of 10/25/2023       Reactions   Gabapentin Swelling   Codeine    REACTION: lethargic   Lamotrigine    REACTION: lips swell   Shellfish Allergy    Some shellfish- is able to eat shrimp but not mussels, clams    Topiramate    REACTION: hallucinations        Medication List        Accurate as of October 25, 2023 11:59 PM. If you have any questions, ask your nurse or doctor.          albuterol 108 (90 Base) MCG/ACT inhaler Commonly known as: VENTOLIN  HFA Inhale 2 puffs into the lungs every 6 (six) hours as needed. sob   amLODipine 5 MG tablet Commonly known as: NORVASC Take 1 tablet (5 mg total) by mouth daily.   aspirin EC 81 MG tablet Take 1 tablet (81 mg total) by mouth daily. Swallow whole.   atomoxetine 10 MG capsule Commonly known as: STRATTERA Take 1 capsule (10 mg total) by mouth daily.   atorvastatin 10 MG tablet Commonly known as: LIPITOR Take 1 tablet (10 mg total) by mouth daily.   lithium carbonate 300 MG ER tablet Commonly known as: LITHOBID Take 3 tablets (900 mg total) by mouth at bedtime.   propranolol 10 MG tablet Commonly known as: INDERAL Take 1 tablet (10 mg total) by mouth daily with supper. What changed: Another medication with the same name was added. Make sure you understand how and when to take each. Changed by: Aaron Boeh X Crespin Forstrom   propranolol 10 MG tablet Commonly known as: INDERAL Take 1 tablet (10 mg total) by mouth daily  as needed. What changed: You were already taking a medication with the same name, and this prescription was added. Make sure you understand how and when to take each. Changed by: Tavarious Freel X Aiyanah Kalama   spironolactone 25 MG tablet Commonly known as: ALDACTONE Take 1 tablet (25 mg total) by mouth daily.        Review of Systems:  Review of Systems  Constitutional:  Negative for appetite change, fatigue and fever.  HENT:  Negative for trouble swallowing.   Eyes:  Negative for visual disturbance.  Respiratory:  Negative for cough and wheezing.   Cardiovascular:  Negative for leg swelling.  Gastrointestinal:  Negative for abdominal pain, constipation, nausea and vomiting.  Genitourinary:  Negative for dysuria, frequency and urgency.       3/night  Musculoskeletal:  Negative for gait problem.  Neurological:  Positive for tremors. Negative for speech difficulty and headaches.  Psychiatric/Behavioral:  Positive for sleep disturbance. Negative for confusion. The patient is nervous/anxious.     Health Maintenance  Topic Date Due   DTaP/Tdap/Td (1 - Tdap) Never done   Zoster Vaccines- Shingrix (1 of 2) Never done   Cervical Cancer Screening (HPV/Pap Cotest)  Never done   Colonoscopy  Never done   MAMMOGRAM  08/25/2014   INFLUENZA VACCINE  07/11/2023   COVID-19 Vaccine (4 - 2023-24 season) 08/11/2023   Lung Cancer Screening  05/28/2024   Hepatitis C Screening  Completed   HIV Screening  Completed   HPV VACCINES  Aged Out    Physical Exam: Vitals:   10/25/23 1438  BP: 138/88  Pulse: 83  Resp: 20  Temp: (!) 96.8 F (36 C)  SpO2: 93%  Weight: 260 lb 3.2 oz (118 kg)  Height: 5\' 5"  (1.651 m)   Body mass index is 43.3 kg/m. Physical Exam Vitals and nursing note reviewed.  Constitutional:      Appearance: Normal appearance.  HENT:     Head: Normocephalic and atraumatic.     Nose: Nose normal.     Mouth/Throat:     Mouth: Mucous membranes are moist.  Eyes:     Extraocular  Movements: Extraocular movements intact.     Conjunctiva/sclera: Conjunctivae normal.     Pupils: Pupils are equal, round, and reactive to light.  Cardiovascular:     Rate and Rhythm: Normal rate and regular rhythm.     Heart sounds: No murmur heard. Pulmonary:     Effort: Pulmonary effort is normal.  Breath sounds: No rales.  Abdominal:     General: Bowel sounds are normal.     Palpations: Abdomen is soft.     Tenderness: There is no abdominal tenderness.  Musculoskeletal:        General: Normal range of motion.     Cervical back: Normal range of motion and neck supple.     Right lower leg: No edema.     Left lower leg: No edema.  Skin:    General: Skin is warm and dry.  Neurological:     General: No focal deficit present.     Mental Status: She is alert and oriented to person, place, and time. Mental status is at baseline.     Gait: Gait normal.     Comments: No resting or intentional tremor in hand/fingers or cogwheeling upon my examination.   Psychiatric:        Mood and Affect: Mood normal.        Behavior: Behavior normal.        Thought Content: Thought content normal.        Judgment: Judgment normal.     Labs reviewed: Basic Metabolic Panel: Recent Labs    06/12/23 0905  NA 140  K 4.1  CL 105  CO2 24  GLUCOSE 103*  BUN 14  CREATININE 0.77  CALCIUM 9.1  TSH 1.21   Liver Function Tests: Recent Labs    06/12/23 0905  AST 12  ALT 16  BILITOT 0.5  PROT 6.9   No results for input(s): "LIPASE", "AMYLASE" in the last 8760 hours. No results for input(s): "AMMONIA" in the last 8760 hours. CBC: Recent Labs    06/12/23 0905  WBC 3.4*  NEUTROABS 1,448*  HGB 12.8  HCT 38.2  MCV 85.3  PLT 200   Lipid Panel: Recent Labs    06/12/23 0905  CHOL 282*  HDL 55  LDLCALC 202*  TRIG 112  CHOLHDL 5.1*   No results found for: "HGBA1C"  Procedures since last visit: No results found.  Assessment/Plan Tremulousness Intentional, predictable,  the R  hand tremulous on and off, have to use the left hand to hold it down, occurs at speech when holding microphone, at lunch, stated she is not nervous at all.   Try Propranolol prn.   HTN (hypertension) Blood pressure is controlled, takes Amlodipine, Spironolactone  Bipolar 1 disorder (HCC) Lithium(off Abilify 2/2 EPS), TSH 1.21 06/12/23, lithium level 0.2 09/17/23  ADHD Atomoxetine  Akathisia on Propranolol  HYPERLIPIDEMIA  Atorvastatin, LDL 202 06/12/23    Labs/tests ordered:  none Next appt:  11/06/2023

## 2023-10-25 NOTE — Assessment & Plan Note (Signed)
Atorvastatin, LDL 202 06/12/23

## 2023-10-25 NOTE — Assessment & Plan Note (Signed)
Atomoxetine

## 2023-10-25 NOTE — Assessment & Plan Note (Signed)
Intentional, predictable,  the R hand tremulous on and off, have to use the left hand to hold it down, occurs at speech when holding microphone, at lunch, stated she is not nervous at all.   Try Propranolol prn.

## 2023-10-25 NOTE — Assessment & Plan Note (Signed)
Lithium(off Abilify 2/2 EPS), TSH 1.21 06/12/23, lithium level 0.2 09/17/23

## 2023-10-25 NOTE — Assessment & Plan Note (Signed)
on Propranolol.

## 2023-10-25 NOTE — Assessment & Plan Note (Signed)
Blood pressure is controlled, takes Amlodipine, Spironolactone

## 2023-11-06 ENCOUNTER — Ambulatory Visit: Payer: BC Managed Care – PPO | Admitting: Family

## 2023-11-16 ENCOUNTER — Emergency Department (HOSPITAL_COMMUNITY)
Admission: EM | Admit: 2023-11-16 | Discharge: 2023-11-17 | Disposition: A | Discharge status: Home / Self Care | Payer: BC Managed Care – PPO | Attending: Emergency Medicine | Admitting: Emergency Medicine

## 2023-11-16 ENCOUNTER — Encounter (HOSPITAL_COMMUNITY): Payer: Self-pay | Admitting: *Deleted

## 2023-11-16 ENCOUNTER — Other Ambulatory Visit: Payer: Self-pay

## 2023-11-16 ENCOUNTER — Emergency Department (HOSPITAL_COMMUNITY): Payer: BC Managed Care – PPO

## 2023-11-16 DIAGNOSIS — R079 Chest pain, unspecified: Secondary | ICD-10-CM | POA: Insufficient documentation

## 2023-11-16 DIAGNOSIS — I700 Atherosclerosis of aorta: Secondary | ICD-10-CM | POA: Diagnosis not present

## 2023-11-16 DIAGNOSIS — R0789 Other chest pain: Secondary | ICD-10-CM | POA: Diagnosis not present

## 2023-11-16 DIAGNOSIS — R209 Unspecified disturbances of skin sensation: Secondary | ICD-10-CM | POA: Diagnosis not present

## 2023-11-16 DIAGNOSIS — Z7982 Long term (current) use of aspirin: Secondary | ICD-10-CM | POA: Diagnosis not present

## 2023-11-16 DIAGNOSIS — Z7951 Long term (current) use of inhaled steroids: Secondary | ICD-10-CM | POA: Insufficient documentation

## 2023-11-16 DIAGNOSIS — I10 Essential (primary) hypertension: Secondary | ICD-10-CM | POA: Diagnosis not present

## 2023-11-16 DIAGNOSIS — Z853 Personal history of malignant neoplasm of breast: Secondary | ICD-10-CM | POA: Insufficient documentation

## 2023-11-16 DIAGNOSIS — Z79899 Other long term (current) drug therapy: Secondary | ICD-10-CM | POA: Diagnosis not present

## 2023-11-16 DIAGNOSIS — J9811 Atelectasis: Secondary | ICD-10-CM | POA: Diagnosis not present

## 2023-11-16 DIAGNOSIS — J45909 Unspecified asthma, uncomplicated: Secondary | ICD-10-CM | POA: Diagnosis not present

## 2023-11-16 DIAGNOSIS — M549 Dorsalgia, unspecified: Secondary | ICD-10-CM | POA: Diagnosis not present

## 2023-11-16 LAB — BASIC METABOLIC PANEL
Anion gap: 12 (ref 5–15)
BUN: 11 mg/dL (ref 6–20)
CO2: 21 mmol/L — ABNORMAL LOW (ref 22–32)
Calcium: 8.8 mg/dL — ABNORMAL LOW (ref 8.9–10.3)
Chloride: 105 mmol/L (ref 98–111)
Creatinine, Ser: 1.1 mg/dL — ABNORMAL HIGH (ref 0.44–1.00)
GFR, Estimated: 58 mL/min — ABNORMAL LOW (ref >60)
Glucose, Bld: 102 mg/dL — ABNORMAL HIGH (ref 70–99)
Potassium: 3.9 mmol/L (ref 3.5–5.1)
Sodium: 138 mmol/L (ref 135–145)

## 2023-11-16 LAB — CBC
HCT: 38.7 % (ref 36.0–46.0)
Hemoglobin: 12.6 g/dL (ref 12.0–15.0)
MCH: 29.4 pg (ref 26.0–34.0)
MCHC: 32.6 g/dL (ref 30.0–36.0)
MCV: 90.4 fL (ref 80.0–100.0)
Platelets: 242 10*3/uL (ref 150–400)
RBC: 4.28 MIL/uL (ref 3.87–5.11)
RDW: 13.1 % (ref 11.5–15.5)
WBC: 7.1 10*3/uL (ref 4.0–10.5)
nRBC: 0 % (ref 0.0–0.2)

## 2023-11-16 LAB — TROPONIN I (HIGH SENSITIVITY)
Troponin I (High Sensitivity): 5 ng/L (ref <18)
Troponin I (High Sensitivity): 5 ng/L (ref <18)

## 2023-11-16 NOTE — ED Triage Notes (Signed)
The pt is c/o lt sided chest and arm pain since this am  with some lt hand numbness   no sob dizziness

## 2023-11-17 ENCOUNTER — Emergency Department (HOSPITAL_COMMUNITY): Payer: BC Managed Care – PPO

## 2023-11-17 DIAGNOSIS — J9811 Atelectasis: Secondary | ICD-10-CM | POA: Diagnosis not present

## 2023-11-17 DIAGNOSIS — M549 Dorsalgia, unspecified: Secondary | ICD-10-CM | POA: Diagnosis not present

## 2023-11-17 DIAGNOSIS — R079 Chest pain, unspecified: Secondary | ICD-10-CM | POA: Diagnosis not present

## 2023-11-17 DIAGNOSIS — I700 Atherosclerosis of aorta: Secondary | ICD-10-CM | POA: Diagnosis not present

## 2023-11-17 LAB — TROPONIN I (HIGH SENSITIVITY)
Troponin I (High Sensitivity): 4 ng/L (ref <18)
Troponin I (High Sensitivity): 4 ng/L (ref <18)

## 2023-11-17 MED ORDER — IOHEXOL 350 MG/ML SOLN
50.0000 mL | Freq: Once | INTRAVENOUS | Status: AC | PRN
  Administered 2023-11-17: 50 mL via INTRAVENOUS

## 2023-11-17 MED ORDER — NITROGLYCERIN 0.4 MG SL SUBL
0.4000 mg | SUBLINGUAL_TABLET | SUBLINGUAL | Status: DC | PRN
Start: 2023-11-17 — End: 2023-11-17

## 2023-11-17 MED ORDER — ASPIRIN 81 MG PO CHEW
324.0000 mg | CHEWABLE_TABLET | Freq: Once | ORAL | Status: AC
  Administered 2023-11-17: 324 mg via ORAL
  Filled 2023-11-17: qty 4

## 2023-11-17 NOTE — Discharge Instructions (Signed)
It was a pleasure taking care of you today.  As discussed, your workup was reassuring.  CT scan did not show evidence of a blood clot.  Your cardiac markers were normal.  I spoke to cardiology while you were in the ER who felt it was safe for you to follow-up with cardiology in the outpatient setting.  I placed a referral to cardiology.  They will call you within the next week to schedule an appointment.  Return to the ER for new or worsening symptoms.

## 2023-11-17 NOTE — ED Provider Notes (Signed)
EMERGENCY DEPARTMENT AT [facility] Provider Note   CSN: [id number] Arrival date & time: 11/16/23  1923     History  Chief Complaint  Patient presents with   Chest Pain    [name] is a 62 y.o. female with a past medical history significant for asthma, bipolar disorder, history of PE, history of breast cancer, and hyperlipidemia who presents to the ED due to chest pain that started around 9 AM yesterday morning.  Patient admits to left-sided chest pain that radiates down her left arm and is now radiating to her left shoulder blade.  Patient admits to numbness/tingling to her left upper extremity.  Pain is worse with exertion and improves with rest.  Chest pain has been intermittent since 9 AM yesterday morning.  No cardiac history.  History of PE in the remote past not currently on any anticoagulants.  Denies associated shortness of breath, nausea, vomiting, diaphoresis.  No lower extremity edema.  Does not follow cardiology.  Denies abdominal pain.  History obtained from patient and past medical records. No interpreter used during encounter.       Home Medications Prior to Admission medications   Medication Sig Start Date End Date Taking? Authorizing Provider  albuterol (PROVENTIL HFA;VENTOLIN HFA) 108 (90 BASE) MCG/ACT inhaler Inhale 2 puffs into the lungs every 6 (six) hours as needed. sob     [provider]  amLODipine (NORVASC) 5 MG tablet Take 1 tablet (5 mg total) by mouth daily. 09/03/23   Ngetich, Dinah C, NP  aspirin EC 81 MG tablet Take 1 tablet (81 mg total) by mouth daily. Swallow whole. 06/20/23   Ngetich, Dinah C, NP  atomoxetine (STRATTERA) 10 MG capsule Take 1 capsule (10 mg total) by mouth daily. 08/05/23   Carlyn Reichert, MD  atorvastatin (LIPITOR) 10 MG tablet Take 1 tablet (10 mg total) by mouth daily. 06/20/23   Ngetich, Dinah C, NP  lithium carbonate (LITHOBID) 300 MG ER tablet Take 3 tablets (900 mg total) by mouth at  bedtime. 09/19/23   Carlyn Reichert, MD  propranolol (INDERAL) 10 MG tablet Take 1 tablet (10 mg total) by mouth daily with supper. 09/20/23   Ngetich, Dinah C, NP  propranolol (INDERAL) 10 MG tablet Take 1 tablet (10 mg total) by mouth daily as needed. 10/25/23   Mast, Man X, NP  spironolactone (ALDACTONE) 25 MG tablet Take 1 tablet (25 mg total) by mouth daily. 09/20/23   Ngetich, Dinah C, NP      Allergies    Gabapentin, Codeine, Lamotrigine, Shellfish allergy, and Topiramate    Review of Systems   Review of Systems  Constitutional:  Negative for chills and fever.  Respiratory:  Negative for shortness of breath.   Cardiovascular:  Positive for chest pain. Negative for leg swelling.  Gastrointestinal:  Negative for abdominal pain.  Neurological:  Positive for numbness.    Physical Exam Updated Vital Signs BP 114/79   Pulse 76   Temp 98.7 F (37.1 C) (Oral)   Resp 18   Ht 5\' 5"  (1.651 m)   Wt 118 kg   SpO2 100%   BMI 43.29 kg/m  Physical Exam Vitals and nursing note reviewed.  Constitutional:      General: She is not in acute distress.    Appearance: She is not ill-appearing.  HENT:     Head: Normocephalic.  Eyes:     Pupils: Pupils are equal, round, and reactive to light.  Cardiovascular:  Rate and Rhythm: Normal rate and regular rhythm.     Pulses: Normal pulses.     Heart sounds: Normal heart sounds. No murmur heard.    No friction rub. No gallop.  Pulmonary:     Effort: Pulmonary effort is normal.     Breath sounds: Normal breath sounds.  Chest:     Comments: No reproducible tenderness on exam Abdominal:     General: Abdomen is flat. There is no distension.     Palpations: Abdomen is soft.     Tenderness: There is no abdominal tenderness. There is no guarding or rebound.  Musculoskeletal:        General: Normal range of motion.     Cervical back: Neck supple.     Comments: No lower extremity edema  Skin:    General: Skin is warm and dry.   Neurological:     General: No focal deficit present.     Mental Status: She is alert.  Psychiatric:        Mood and Affect: Mood normal.        Behavior: Behavior normal.     ED Results / Procedures / Treatments   Labs (all labs ordered are listed, but only abnormal results are displayed) Labs Reviewed  BASIC METABOLIC PANEL - Abnormal; Notable for the following components:      Result Value   CO2 21 (*)    Glucose, Bld 102 (*)    Creatinine, Ser 1.10 (*)    Calcium 8.8 (*)    GFR, Estimated 58 (*)    All other components within normal limits  CBC  TROPONIN I (HIGH SENSITIVITY)  TROPONIN I (HIGH SENSITIVITY)  TROPONIN I (HIGH SENSITIVITY)  TROPONIN I (HIGH SENSITIVITY)    EKG EKG Interpretation Date/Time:  Saturday November 16 2023 19:36:00 EST Ventricular Rate:  89 PR Interval:  156 QRS Duration:  70 QT Interval:  380 QTC Calculation: 462 R Axis:   40  Text Interpretation: Normal sinus rhythm Normal ECG When compared with ECG of 26-Jun-2012 17:39, No significant change was found Confirmed by Dione Booze ([id number]) on 11/17/2023 12:59:49 AM  Radiology CT Angio Chest PE W and/or Wo Contrast  Result Date: 11/17/2023 CLINICAL DATA:  Chest and upper back pain. Pulmonary embolism suspected, high probability. EXAM: CT ANGIOGRAPHY CHEST WITH CONTRAST TECHNIQUE: Multidetector CT imaging of the chest was performed using the standard protocol during bolus administration of intravenous contrast. Multiplanar CT image reconstructions and MIPs were obtained to evaluate the vascular anatomy. RADIATION DOSE REDUCTION: This exam was performed according to the departmental dose-optimization program which includes automated exposure control, adjustment of the mA and/or kV according to patient size and/or use of iterative reconstruction technique. CONTRAST:  50mL OMNIPAQUE IOHEXOL 350 MG/ML SOLN COMPARISON:  Radiographs 11/16/2023. Noncontrast chest CT 05/29/2023. CTA 11/13/2011. FINDINGS:  Cardiovascular: The pulmonary arteries are well opacified with contrast to the level of the segmental branches. There is no evidence of acute pulmonary embolism. Stable mild aortic atherosclerosis without evidence of acute systemic arterial abnormality. The heart size is normal. There is no pericardial effusion. Mediastinum/Nodes: There are no enlarged mediastinal, hilar or axillary lymph nodes.Surgical clips in the right breast and right axilla. The thyroid gland, trachea and esophagus demonstrate no significant findings. Lungs/Pleura: No pleural effusion or pneumothorax. New linear, partially calcified lingular opacity, favoring postinflammatory scarring. There is mild dependent atelectasis at both lung bases. No confluent airspace disease or suspicious pulmonary nodules. Upper abdomen:  The visualized upper abdomen appears unremarkable. Musculoskeletal/Chest  wall: There is no chest wall mass or suspicious osseous finding. Review of the MIP images confirms the above findings. IMPRESSION: 1. No evidence of acute pulmonary embolism or other acute chest findings. 2. New linear, partially calcified lingular opacity, favoring postinflammatory scarring. 3.  Aortic Atherosclerosis (ICD10-I70.0). Electronically Signed   By: Carey Bullocks M.D.   On: 11/17/2023 08:54   DG Chest 2 View  Result Date: 11/16/2023 CLINICAL DATA:  Chest pain EXAM: CHEST - 2 VIEW COMPARISON:  CT chest dated 05/29/2023 FINDINGS: Lungs are clear.  No pleural effusion or pneumothorax. The heart is normal in size. Visualized osseous structures are within normal limits. IMPRESSION: Normal chest radiographs. Electronically Signed   By: Charline Bills M.D.   On: 11/16/2023 20:04    Procedures Procedures    Medications Ordered in ED Medications  nitroGLYCERIN (NITROSTAT) SL tablet 0.4 mg (has no administration in time range)  aspirin chewable tablet 324 mg (324 mg Oral Given 11/17/23 0854)  iohexol (OMNIPAQUE) 350 MG/ML injection 50 mL  (50 mLs Intravenous Contrast Given 11/17/23 1610)    ED Course/ Medical Decision Making/ A&P                                 Medical Decision Making Amount and/or Complexity of Data Reviewed External Data Reviewed: notes. Labs: ordered. Decision-making details documented in ED Course. Radiology: ordered and independent interpretation performed. Decision-making details documented in ED Course. ECG/medicine tests: ordered and independent interpretation performed. Decision-making details documented in ED Course.  Risk OTC drugs. Prescription drug management.   This patient presents to the ED for concern of CP, this involves an extensive number of treatment options, and is a complaint that carries with it a high risk of complications and morbidity.  The differential diagnosis includes ACS, PE, aortic dissection, PNA, MSK etiology, etc  62 year old female presents to the ED due to intermittent left-sided chest pain that radiates down left upper extremity since 9 AM yesterday morning.  Worse with exertion and improved with rest.  No history of CAD.  History of remote PE not on any anticoagulants.  Also admits to numbness/tingling of the left upper extremity.  Denies associated shortness of breath.  Upon arrival patient afebrile, not tachycardic or hypoxic.  Patient in no acute distress.  Reassuring physical exam.  No lower extremity edema.  No reproducible tenderness of anterior chest wall.  Lungs clear to auscultation bilaterally.  Abdomen soft, nondistended, nontender.  Unfortunately, patient waited over 11.5 hours prior to my initial evaluation due to long wait times.  Cardiac labs ordered in triage.  Added CTA chest given remote history of PE to rule out PE.  Also added third troponin given patient is still having some chest pain.  Chest pain concerning for unstable angina.  ASA given after initial evaluation.  If CTA chest is normal we will reach out to cardiology for further recommendations due  to concerning story. No neck pain or cervical midline tenderness to suggest cervical radiculopathy.   CBC unremarkable.  No leukocytosis.  Normal hemoglobin.  Troponin x 2 normal.  EKG normal sinus rhythm.  No signs of acute ischemia.  Low suspicion for ACS.  Chest x-ray personally reviewed and interpreted which is negative for signs of pneumonia, pneumothorax, or widened mediastinum.  BMP with elevated creatinine at 1.1.  9:12 AM Discussed with Dr. Rennis Golden with cardiology who notes given patient's troponins are normal with reassuring EKG, patient can  follow-up with cardiology in the outpatient setting. Ambulatory referral placed.   Troponin x4 normal. CTA chest personally reviewed and interpreted which is negative for PE. Does show a lingular opacity likely postinflammatory scarring.  Upon reassessment, patient notes some improvement in pain. Low suspicion for aortic dissection. Low suspicion for ACS.  Ambulatory referral placed to cardiology after discussion with cardiology in the ED.  Patient stable for discharge.  Advised patient return for any new or worsening symptoms. Strict ED precautions discussed with patient. Patient states understanding and agrees to plan. Patient discharged home in no acute distress and stable vitals  Co morbidities that complicate the patient evaluation  HLD, HTN Cardiac Monitoring: / EKG:  The patient was maintained on a cardiac monitor.  I personally viewed and interpreted the cardiac monitored which showed an underlying rhythm of: NSR  Social Determinants of Health:  Has PCP, lives at home  Test / Admission - Considered:  Considered admission for angina; however after discussion with cardiology they felt she was stable for outpatient follow-up given normal troponin x4 and reassuring EKG.        Final Clinical Impression(s) / ED Diagnoses Final diagnoses:  Nonspecific chest pain    Rx / DC Orders ED Discharge Orders          Ordered    Ambulatory  referral to Cardiology        11/17/23 0956              Mannie Stabile, PA-C 11/17/23 1610    Derwood Kaplan, MD 11/21/23 2041

## 2023-11-17 NOTE — ED Notes (Signed)
CT notified of new IV 

## 2023-11-17 NOTE — ED Notes (Signed)
Pt taken to CT, alert, NAD,calm, interactive.

## 2023-11-20 ENCOUNTER — Telehealth: Payer: Self-pay

## 2023-11-20 ENCOUNTER — Ambulatory Visit (HOSPITAL_COMMUNITY): Payer: BC Managed Care – PPO | Admitting: Student

## 2023-11-20 NOTE — Transitions of Care (Post Inpatient/ED Visit) (Signed)
   11/20/2023  Name: ROMEKA BLACKWOOD MRN: [id number] DOB: 1961/03/27  Today's TOC FU Call Status: Today's TOC FU Call Status:: Unsuccessful Call (1st Attempt) Unsuccessful Call (1st Attempt) Date: 11/20/23  Attempted to reach the patient regarding the most recent Inpatient/ED visit.  Follow Up Plan: Additional outreach attempts will be made to reach the patient to complete the Transitions of Care (Post Inpatient/ED visit) call.   Signature: Erik Burkett.D/RMA

## 2023-11-22 DIAGNOSIS — F902 Attention-deficit hyperactivity disorder, combined type: Secondary | ICD-10-CM | POA: Diagnosis not present

## 2023-11-22 DIAGNOSIS — F3132 Bipolar disorder, current episode depressed, moderate: Secondary | ICD-10-CM | POA: Diagnosis not present

## 2023-12-30 ENCOUNTER — Ambulatory Visit (HOSPITAL_COMMUNITY): Payer: BC Managed Care – PPO | Admitting: Student

## 2024-01-10 ENCOUNTER — Ambulatory Visit: Payer: BC Managed Care – PPO | Admitting: Cardiovascular Disease

## 2024-01-10 DIAGNOSIS — F3132 Bipolar disorder, current episode depressed, moderate: Secondary | ICD-10-CM | POA: Diagnosis not present

## 2024-01-10 DIAGNOSIS — F902 Attention-deficit hyperactivity disorder, combined type: Secondary | ICD-10-CM | POA: Diagnosis not present

## 2024-02-06 ENCOUNTER — Encounter: Payer: Self-pay | Admitting: Family

## 2024-02-06 ENCOUNTER — Ambulatory Visit (INDEPENDENT_AMBULATORY_CARE_PROVIDER_SITE_OTHER): Payer: BC Managed Care – PPO | Admitting: Family

## 2024-02-06 VITALS — BP 128/100 | HR 85 | Temp 98.2°F | Resp 18 | Wt 261.0 lb

## 2024-02-06 DIAGNOSIS — Z1231 Encounter for screening mammogram for malignant neoplasm of breast: Secondary | ICD-10-CM | POA: Diagnosis not present

## 2024-02-06 DIAGNOSIS — Z23 Encounter for immunization: Secondary | ICD-10-CM | POA: Insufficient documentation

## 2024-02-06 DIAGNOSIS — F902 Attention-deficit hyperactivity disorder, combined type: Secondary | ICD-10-CM | POA: Diagnosis not present

## 2024-02-06 DIAGNOSIS — M25551 Pain in right hip: Secondary | ICD-10-CM

## 2024-02-06 DIAGNOSIS — Z1211 Encounter for screening for malignant neoplasm of colon: Secondary | ICD-10-CM | POA: Diagnosis not present

## 2024-02-06 DIAGNOSIS — N63 Unspecified lump in unspecified breast: Secondary | ICD-10-CM

## 2024-02-06 DIAGNOSIS — F3132 Bipolar disorder, current episode depressed, moderate: Secondary | ICD-10-CM | POA: Diagnosis not present

## 2024-02-06 DIAGNOSIS — Z2821 Immunization not carried out because of patient refusal: Secondary | ICD-10-CM | POA: Insufficient documentation

## 2024-02-06 MED ORDER — PNEUMOCOCCAL 20-VAL CONJ VACC 0.5 ML IM SUSY: 0.5000 mL | PREFILLED_SYRINGE | INTRAMUSCULAR | 0 refills | Status: AC

## 2024-02-06 NOTE — Progress Notes (Signed)
 Provider: Richarda Blade FNP-C  Glenwood Revoir, Donalee Citrin, NP  Patient Care Team: Taheera Thomann, Donalee Citrin, NP as PCP - General (Family Medicine)  Extended Emergency Contact Information Primary Emergency Contact: Rosalie Gums [place] Mobile Phone: [phone] Relation: Spouse Preferred language: English Interpreter needed? No Secondary Emergency Contact: Kelly,Dale  [place] Phone: [phone] Relation: Father Preferred language: English Interpreter needed? No  Code Status:  Full Code  Goals of care: Advanced Directive information    02/06/2024    3:51 PM  Advanced Directives  Does Patient Have a Medical Advance Directive? Yes  Type of Estate agent of [place];Living will;Out of facility DNR (pink MOST or yellow form)  Does patient want to make changes to medical advance directive? No - Patient declined  Copy of Healthcare Power of Attorney in Chart? No - copy requested     Chief Complaint  Patient presents with   Referral    Needs referral for breast testing. Discuss the need for mammogram, flu, covid, pneumococcal, tdap, shingles, cervical, colonoscopy and lung caner screening.   Have some concerns about right hip pain goes down her leg. Also having trimmers in both hands.    Discussed the use of AI scribe software for clinical note transcription with the patient, who gave verbal consent to proceed.  History of Present Illness   Sheza Strickland Chapman-McDavid "[name]" is a 63 year old female who presents with right hip pain and hand tremors.  She experiences right hip pain that wakes her at night and radiates down her right leg. The pain is more pronounced when lying in bed and worsens the longer she stays in one position. Some relief is found when she is moving around. The pain is described as severe at night, prompting frequent changes in position. No numbness or tingling in her legs is reported.  She has persistent  tremors in her hands, particularly noticeable when holding objects such as a microphone or a fork. The tremors occur daily, though not constantly, and are sometimes present at rest. She finds the tremors embarrassing, especially during speaking engagements. Propranolol was previously advised but has not been effective in managing the tremors. No other shaking in her head or other parts of her body is reported.  She has a history of breast cancer concerns, with past scares about lumps. About three years ago in [place], two small masses were identified in her left breast, one of which was biopsied and found to be benign, while the other was inaccessible for biopsy. A diagnostic mammogram is required due to her history, and she has obtained the necessary films from previous evaluations in [place] and [place].  Her family history is notable for her grandmother having had Parkinson's disease, which is relevant given her current symptoms of hand tremors.    Past Medical History:  Diagnosis Date   Anginal pain (HCC)    11/2011   Asthma    Bipolar 1 disorder (HCC)    Bipolar affective disorder, depressed, mild (HCC)    Remote history of suicidal attempts   Breast cancer (HCC)    In 2005 treated with surgery and Arimidex for 5 years   Depression    Headache(784.0)    h/o migraines    History of pulmonary embolism    Bilateral in 2001   HNP (herniated nucleus pulposus)    2000- L4-5, used pain mgt. prog. in [place]   Hypertension    Personal history of chemotherapy  Personal history of radiation therapy    Shortness of breath    in the past   Past Surgical History:  Procedure Laterality Date   ABDOMINAL HYSTERECTOMY     BREAST BIOPSY Left 12/2020   BREAST LUMPECTOMY Right    2005   MASTOPEXY  02/07/2012   Procedure: MASTOPEXY;  Surgeon: Wayland Denis, DO;  Location: [facility];  Service: Plastics;  Laterality: Left;  left breast mastopexy/reduction for symmetry after right  breast cancer   ORIF ANKLE FRACTURE  06/26/2012   Procedure: OPEN REDUCTION INTERNAL FIXATION (ORIF) ANKLE FRACTURE;  Surgeon: Cammy Copa, MD;  Location: [facility] OR;  Service: Orthopedics;  Laterality: Right;   TUBAL LIGATION      Allergies  Allergen Reactions   Gabapentin Swelling   Codeine     REACTION: lethargic   Lamotrigine     REACTION: lips swell   Shellfish Allergy     Some shellfish- is able to eat shrimp but not mussels, clams    Topiramate     REACTION: hallucinations    Outpatient Encounter Medications as of 02/06/2024  Medication Sig   albuterol (PROVENTIL HFA;VENTOLIN HFA) 108 (90 BASE) MCG/ACT inhaler Inhale 2 puffs into the lungs every 6 (six) hours as needed. sob    amLODipine (NORVASC) 5 MG tablet Take 1 tablet (5 mg total) by mouth daily.   aspirin EC 81 MG tablet Take 1 tablet (81 mg total) by mouth daily. Swallow whole.   atomoxetine (STRATTERA) 10 MG capsule Take 1 capsule (10 mg total) by mouth daily.   atorvastatin (LIPITOR) 10 MG tablet Take 1 tablet (10 mg total) by mouth daily.   lithium carbonate (LITHOBID) 300 MG ER tablet Take 3 tablets (900 mg total) by mouth at bedtime.   pneumococcal 20-valent conjugate vaccine (PREVNAR 20) 0.5 ML injection Inject 0.5 mLs into the muscle tomorrow at 10 am for 1 dose.   propranolol (INDERAL) 10 MG tablet Take 1 tablet (10 mg total) by mouth daily with supper.   propranolol (INDERAL) 10 MG tablet Take 1 tablet (10 mg total) by mouth daily as needed.   spironolactone (ALDACTONE) 25 MG tablet Take 1 tablet (25 mg total) by mouth daily.   No facility-administered encounter medications on file as of 02/06/2024.    Review of Systems  Constitutional:  Negative for appetite change, chills, fatigue, fever and unexpected weight change.  Respiratory:  Negative for cough, chest tightness, shortness of breath and wheezing.   Cardiovascular:  Negative for chest pain, palpitations and leg swelling.  Musculoskeletal:  Positive  for arthralgias. Negative for back pain, gait problem, joint swelling, myalgias, neck pain and neck stiffness.       Right hip pain   Skin:  Negative for color change, pallor, rash and wound.       Breast mass with hx of breast cancer   Neurological:  Positive for tremors. Negative for dizziness, syncope, speech difficulty, weakness, light-headedness, numbness and headaches.    Immunization History  Administered Date(s) Administered   Influenza Split 11/14/2011, 08/27/2012   Influenza,inj,quad, With Preservative 02/18/2019   PFIZER(Purple Top)SARS-COV-2 Vaccination 03/15/2020, 04/05/2020, 01/02/2021   PNEUMOCOCCAL CONJUGATE-20 02/06/2024   Pertinent  Health Maintenance Due  Topic Date Due   Colonoscopy  Never done   MAMMOGRAM  08/25/2014   INFLUENZA VACCINE  03/09/2024 (Originally 07/11/2023)      06/20/2023    2:35 PM 10/25/2023    3:43 PM 02/06/2024    3:50 PM  Fall Risk  Falls in the past year? 0 0 1  Was there an injury with Fall? 0 0 0  Fall Risk Category Calculator 0 0 1  Patient at Risk for Falls Due to No Fall Risks No Fall Risks No Fall Risks  Fall risk Follow up Falls evaluation completed  Falls evaluation completed   Functional Status Survey:    Vitals:   02/06/24 1549  BP: (!) 128/100  Pulse: 85  Resp: 18  Temp: 98.2 F (36.8 C)  SpO2: 99%  Weight: 261 lb (118.4 kg)   Body mass index is 43.43 kg/m. Physical Exam Vitals reviewed.  Constitutional:      General: She is not in acute distress.    Appearance: Normal appearance. She is morbidly obese. She is not ill-appearing or diaphoretic.  HENT:     Head: Normocephalic.     Mouth/Throat:     Mouth: Mucous membranes are moist.     Pharynx: Oropharynx is clear. No oropharyngeal exudate.  Eyes:     General: No scleral icterus.       Right eye: No discharge.        Left eye: No discharge.     Conjunctiva/sclera: Conjunctivae normal.     Pupils: Pupils are equal, round, and reactive to light.  Neck:      Vascular: No carotid bruit.  Cardiovascular:     Rate and Rhythm: Normal rate and regular rhythm.     Pulses: Normal pulses.     Heart sounds: Normal heart sounds. No murmur heard.    No friction rub. No gallop.  Pulmonary:     Effort: Pulmonary effort is normal. No respiratory distress.     Breath sounds: Normal breath sounds. No wheezing, rhonchi or rales.  Chest:     Chest wall: No tenderness.  Abdominal:     General: Bowel sounds are normal. There is no distension.     Palpations: Abdomen is soft. There is no mass.     Tenderness: There is no abdominal tenderness. There is no right CVA tenderness, left CVA tenderness, guarding or rebound.  Musculoskeletal:        General: No swelling. Normal range of motion.     Cervical back: Normal range of motion. No rigidity or tenderness.     Right lower leg: No edema.     Left lower leg: No edema.     Comments: Pain on right hip flexion. Mild pain on right hip adduction. No pain on left hip flexion. No pain on left hip adduction.     Lymphadenopathy:     Cervical: No cervical adenopathy.  Skin:    General: Skin is warm and dry.     Coloration: Skin is not pale.     Findings: No bruising, erythema, lesion or rash.  Neurological:     Mental Status: She is alert and oriented to person, place, and time.     Cranial Nerves: No cranial nerve deficit.     Sensory: No sensory deficit.     Motor: No weakness.     Coordination: Coordination normal.     Gait: Gait normal.  Psychiatric:        Mood and Affect: Mood normal.        Speech: Speech normal.        Behavior: Behavior normal.        Thought Content: Thought content normal.        Judgment: Judgment normal.    Labs reviewed: Recent Labs  06/12/23 0905 11/16/23 1953  NA 140 138  K 4.1 3.9  CL 105 105  CO2 24 21*  GLUCOSE 103* 102*  BUN 14 11  CREATININE 0.77 1.10*  CALCIUM 9.1 8.8*   Recent Labs    06/12/23 0905  AST 12  ALT 16  BILITOT 0.5  PROT 6.9   Recent  Labs    06/12/23 0905 11/16/23 1953  WBC 3.4* 7.1  NEUTROABS 1,448*  --   HGB 12.8 12.6  HCT 38.2 38.7  MCV 85.3 90.4  PLT 200 242   Lab Results  Component Value Date   TSH 1.21 06/12/2023   No results found for: "HGBA1C" Lab Results  Component Value Date   CHOL 282 (H) 06/12/2023   HDL 55 06/12/2023   LDLCALC 202 (H) 06/12/2023   TRIG 112 06/12/2023   CHOLHDL 5.1 (H) 06/12/2023    Significant Diagnostic Results in last 30 days:  No results found.  Assessment/Plan  Right Hip Pain Chronic right hip pain, exacerbated at night and alleviated with movement. Pain radiates down the right leg. Differential diagnosis includes bursitis, arthritis, or other musculoskeletal issues. Discussed potential orthopedic referral if imaging suggests treatable conditions. Patient concerned about potential hip replacement due to time constraints. - Order x-ray of the right hip - Consider referral to orthopedics if imaging suggests bursitis or other treatable conditions  Hand Tremors Persistent hand tremors, more pronounced during activities such as holding objects. Propranolol previously ineffective. Differential diagnosis includes essential tremor, Parkinson's disease, or other neurological conditions. Family history of Parkinson's disease. Discussed need for neurologist evaluation to rule out Parkinson's and other neurological issues. Patient desires early diagnosis and treatment. - Refer to neurologist for further evaluation and testing  Breast Cancer Screening Previous findings of two small masses in the left breast; one benign on biopsy, the other not biopsied. Requires diagnostic mammogram for both breasts. Discussed need for genetic testing for breast cancer due to family history and daughter's concerns. Referral to oncologist or gynecologist for genetic testing as primary care does not typically order this. - Order diagnostic mammogram for both breasts - Refer to oncologist for genetic  testing for breast cancer  General Health Maintenance Due for routine health maintenance screenings and vaccinations. Discussed importance of pneumonia and shingles vaccines, colonoscopy, and Pap smear. - Administer pneumonia vaccine - Recommend shingles vaccine (Shingrix) at the pharmacy - Refer to gastroenterologist for colonoscopy - Recommend Pap smear  Follow-up - Follow up with neurologist for hand tremors - Follow up with oncologist for genetic testing - Follow up with [facility] for x-ray and mammogram - Follow up with gastroenterologist for colonoscopy.      Family/ staff Communication: Reviewed plan of care with patient verbalized understanding   Labs/tests ordered: None   Next Appointment: Return if symptoms worsen or fail to improve.   Total time: minutes. Greater than 50% of total time spent doing patient education regarding Hand tremors,breast mass ,right hip pain,health maintenance including symptom/medication management.   Caesar Bookman, NP

## 2024-02-10 ENCOUNTER — Ambulatory Visit
Admission: RE | Admit: 2024-02-10 | Discharge: 2024-02-10 | Disposition: A | Discharge status: Home / Self Care | Source: Ambulatory Visit | Attending: Family | Admitting: Family

## 2024-02-10 DIAGNOSIS — M25551 Pain in right hip: Secondary | ICD-10-CM | POA: Diagnosis not present

## 2024-02-11 ENCOUNTER — Other Ambulatory Visit: Payer: Self-pay | Admitting: Family

## 2024-02-11 DIAGNOSIS — Z1231 Encounter for screening mammogram for malignant neoplasm of breast: Secondary | ICD-10-CM

## 2024-02-12 ENCOUNTER — Other Ambulatory Visit: Payer: Self-pay | Admitting: Family

## 2024-02-12 DIAGNOSIS — M25551 Pain in right hip: Secondary | ICD-10-CM

## 2024-02-17 ENCOUNTER — Telehealth (HOSPITAL_COMMUNITY): Admitting: Student

## 2024-02-17 ENCOUNTER — Other Ambulatory Visit (HOSPITAL_COMMUNITY): Payer: Self-pay

## 2024-02-17 DIAGNOSIS — G2571 Drug induced akathisia: Secondary | ICD-10-CM | POA: Diagnosis not present

## 2024-02-17 DIAGNOSIS — F319 Bipolar disorder, unspecified: Secondary | ICD-10-CM

## 2024-02-17 MED ORDER — ABILIFY MAINTENA 400 MG IM PRSY: 400.0000 mg | PREFILLED_SYRINGE | INTRAMUSCULAR | 10 refills | Status: DC

## 2024-02-17 MED ORDER — ARIPIPRAZOLE ER 400 MG IM PRSY
400.0000 mg | PREFILLED_SYRINGE | INTRAMUSCULAR | Status: AC
  Administered 2024-02-18 – 2024-04-16 (×3): 400 mg via INTRAMUSCULAR

## 2024-02-17 MED ORDER — ARIPIPRAZOLE 5 MG PO TABS: 5.0000 mg | ORAL_TABLET | Freq: Every day | ORAL | 0 refills | Status: DC

## 2024-02-17 MED ORDER — PROPRANOLOL HCL 10 MG PO TABS: 10.0000 mg | ORAL_TABLET | Freq: Two times a day (BID) | ORAL | 2 refills | Status: DC

## 2024-02-18 ENCOUNTER — Telehealth: Payer: Self-pay | Admitting: Genetic Counselor

## 2024-02-18 ENCOUNTER — Other Ambulatory Visit: Payer: Self-pay | Admitting: Family

## 2024-02-18 ENCOUNTER — Encounter (HOSPITAL_COMMUNITY): Payer: Self-pay

## 2024-02-18 ENCOUNTER — Telehealth: Payer: Self-pay | Admitting: Gastroenterology

## 2024-02-18 ENCOUNTER — Ambulatory Visit (HOSPITAL_BASED_OUTPATIENT_CLINIC_OR_DEPARTMENT_OTHER)

## 2024-02-18 DIAGNOSIS — J432 Centrilobular emphysema: Secondary | ICD-10-CM

## 2024-02-18 DIAGNOSIS — F319 Bipolar disorder, unspecified: Secondary | ICD-10-CM | POA: Diagnosis not present

## 2024-02-18 DIAGNOSIS — R5383 Other fatigue: Secondary | ICD-10-CM

## 2024-02-18 NOTE — Progress Notes (Signed)
 Patient arrived today for her due injection of Abilify Maintena $00 mg. Prescription was sent to the pharmacy but needs a prior auth so a sample was given. Patient presents well groomed with an appropriate affect. Patient has gotten the shot in the past and is restarting today after trying Lithium and not having good results. Patient denies SI/HI or AVH. Injection was prepared as ordered and administered in patients Left Deltoid. Patient tolerated well and without complaint and will return in 28 days for her next due injection.      NDC: 09811-914-78 LOT: [id number] EXP: APR 2027

## 2024-02-18 NOTE — Telephone Encounter (Signed)
 Scheduled appointments per patients request for a self referral to genetic counseling. Patient is aware of the made appointments.

## 2024-02-18 NOTE — Telephone Encounter (Signed)
 Inbound all from ,patient requesting to transfer care from [facility].   Patient wanting to establish  care and schedule colonoscopy procedure with supervising MD.   Advised p;patient once we receive records we will submit to provider for review.

## 2024-02-19 NOTE — Progress Notes (Signed)
 BH MD Outpatient Progress Note  02/19/2024 2:07 PM [name]  MRN:  [id number]  Televisit via video: I connected with [name] on 02/17/2024 at  1:30 PM EDT by a video enabled telemedicine application and verified that I am speaking with the correct person using two identifiers.  Location: Patient: Home Provider: Office   I discussed the limitations of evaluation and management by telemedicine and the availability of in person appointments. The patient expressed understanding and agreed to proceed.  I discussed the assessment and treatment plan with the patient. The patient was provided an opportunity to ask questions and all were answered. The patient agreed with the plan and demonstrated an understanding of the instructions.   The patient was advised to call back or seek an in-person evaluation if the symptoms worsen or if the condition fails to improve as anticipated.   Assessment:  [name] presents for follow-up evaluation.  The patient reports intermittent adherence to lithium that was started at the last visit.  It has been quite some time since that appointment (09/02/2023), due to a patient no-show and a provider cancellation.  The patient additionally reports experiencing nausea with the medication.  It is clear that she also has a tremor, which she did not know was most likely due to the medication.  The patient requests transition back to Abilify Maintena which is certainly reasonable at this point due to her issues with adherence and the fact that the Abilify shot kept her symptoms stable (although did not apparently fully treat occasional manic symptoms).  In the future we will consider transition to Aristada to allow further titration upwards of dose (which could presumably help with residual manic symptoms).  The patient will follow-up with me again in 4 weeks.  Identifying Information: [name] is a 63 y.o. y.o.  female with a history of bipolar affective disorder and alcohol use disorder, in sustained remission who is an established patient with [facility] for management of bipolar affective disorder.   Plan:  # Bipolar affective disorder 1, history of numerous psychiatric hospitalizations, currently in partial remission Interventions: - Restart Abilify Maintena 400 mg q. 28 days, with 14-day course of Abilify 5 mg - Consider Aristada, may potentially help better treat manic symptoms - Increase propranolol from 10 mg daily to 10 mg twice daily for possible akathisia with restarting Abilify - Discontinue lithium, patient reports nonadherence for the past 10 days - Creatinine within normal limits and TSH within normal limits - Recommend psychotherapy for this patient  - Can consider Remeron should propranolol turn out to be ineffective for akathisia   # Inattention, self-reported diagnosis of ADHD Interventions: -- Discontinue Strattera 10 mg daily  - Patient possibly has OSA needs a sleep study   Patient was given contact information for behavioral health clinic and was instructed to call 911 for emergencies.   Subjective:  Chief Complaint:  Chief Complaint  Patient presents with   Follow-up    Interval History:  Today the patient reports that she feels she has been doing fairly well.  She reports taking her lithium "semiregularly".  She defines this as missing 1-2 doses per week.  She states that she has not taken the medication in the past 10 days because she developed nausea after taking it 1 day.  The patient states that she has noticed mild improvement in her tremor over the past few days.  The patient reports nonadherence to Strattera and is not  sure if it is helpful.  The patient reports that her mood has been predominantly euthymic.  She denies experiencing any manic or hypomanic symptoms, although she does confide that she spent a few hundred dollars more on  gambling than she meant to at a work trip.  She denies experiencing any suicidal thoughts.  She does not feel that she has experienced any depression.  She reports that her level of anxiety is manageable.  She does report frequent nocturnal awakenings to urinate and reports feeling fatigued during the daytime.  She states that she has been told that she snores.  Strongly recommend sleep study.     Visit Diagnosis:    ICD-10-CM   1. Bipolar 1 disorder (HCC)  F31.9 ARIPiprazole ER (ABILIFY MAINTENA) 400 MG prefilled syringe 400 mg    DISCONTINUED: ARIPiprazole (ABILIFY) 5 MG tablet    2. Akathisia  G25.71 propranolol (INDERAL) 10 MG tablet      Past Psychiatric History:  From initial evaluation: 63 y.o. female with a history of bipolar affective disorder 1 who presents in person to [facility] for initial evaluation of medication management for bipolar disorder.  Patient reports being diagnosed with manic depressive illness at the age of 15, and having approximately 20 psychiatric hospitalizations until achieving sobriety from alcohol when she was approximately 63 years old.  Since becoming sober, the patient reports having no psychiatric hospitalizations until she decided, in conjunction with her psychiatrist, to taper off of Abilify.  She had a depressive relapse and was admitted to a psychiatric hospital near her home in [place].  She was stabilized on Abilify and given a long-acting injectable of the medication.  She states that she had been doing well since that time and recently moved to [place] because of work obligations.  Unfortunately, she became nonadherent to the injection as a result of having no provider in [place].   Past Medical History:  Past Medical History:  Diagnosis Date   Anginal pain (HCC)    11/2011   Asthma    Bipolar 1 disorder (HCC)    Bipolar affective disorder, depressed, mild (HCC)    Remote history of suicidal attempts   Breast  cancer (HCC)    In 2005 treated with surgery and Arimidex for 5 years   Depression    Headache(784.0)    h/o migraines    History of pulmonary embolism    Bilateral in 2001   HNP (herniated nucleus pulposus)    2000- L4-5, used pain mgt. prog. in [place]   Hypertension    Personal history of chemotherapy    Personal history of radiation therapy    Shortness of breath    in the past    Past Surgical History:  Procedure Laterality Date   ABDOMINAL HYSTERECTOMY     BREAST BIOPSY Left 12/2020   BREAST LUMPECTOMY Right    2005   MASTOPEXY  02/07/2012   Procedure: MASTOPEXY;  Surgeon: Wayland Denis, DO;  Location: [facility];  Service: Plastics;  Laterality: Left;  left breast mastopexy/reduction for symmetry after right breast cancer   ORIF ANKLE FRACTURE  06/26/2012   Procedure: OPEN REDUCTION INTERNAL FIXATION (ORIF) ANKLE FRACTURE;  Surgeon: Cammy Copa, MD;  Location: [facility] OR;  Service: Orthopedics;  Laterality: Right;   TUBAL LIGATION      Family Psychiatric History: None pertinent  Family History:  Family History  Problem Relation Age of Onset   Breast cancer Mother  Prostate cancer Father    Diabetes Maternal Aunt    Cancer Maternal Aunt    Parkinson's disease Maternal Grandmother    Breast cancer Paternal Grandmother    Dementia Paternal Grandmother    Cancer Paternal Grandfather    Breast cancer Cousin    Heart attack Brother     Social History:  Social History   Socioeconomic History   Marital status: Married    Spouse name: Not on file   Number of children: Not on file   Years of education: Not on file   Highest education level: Not on file  Occupational History   Not on file  Tobacco Use   Smoking status: Former    Current packs/day: 0.00    Types: Cigarettes    Start date: 06/09/1980    Quit date: 06/09/2010    Years since quitting: 13.7   Smokeless tobacco: Never  Vaping Use   Vaping status: Never Used  Substance and Sexual  Activity   Alcohol use: Not Currently    Comment: 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 2      Pets: No      Highest level or education completed: Law school      Current/Past profession: Librarian, academic of Domestic Violence Banker       Exercise: No                 Type and how often:          Living Will: Yes   DNR: Yes   POA/HPOA: Yes      Functional Status:   Do you have difficulty bathing or dressing yourself? No   Do you have difficulty preparing food or eating? No   Do you have difficulty managing your medications? No   Do you have difficulty managing your finances? No   Do you have difficulty affording your medications? No   Social Drivers of Corporate investment banker Strain: Medium Risk (11/06/2019)   Received from [facility], [facility]   Overall Financial Resource Strain (CARDIA)    Difficulty of Paying Living Expenses: Somewhat hard  Food Insecurity: No Food Insecurity (11/06/2019)   Received from [facility], [facility]   Hunger Vital Sign    Worried About Running Out of Food in the Last Year: Never true    Ran Out of Food in the Last Year: Never true  Transportation Needs: No Transportation Needs (11/06/2019)   Received from [facility], [facility] - Transportation    Lack of Transportation (Medical): No    Lack of Transportation (Non-Medical): No  Physical Activity: Inactive (03/25/2019)   Received from [facility], [facility]   Exercise Vital Sign     Days of Exercise per Week: 0 days    Minutes of Exercise per Session: 0 min  Stress: Stress Concern Present (03/25/2019)   Received from  [facility]   [employer] [facility] - Occupational Stress Questionnaire    Feeling of Stress : Very much  Social Connections: Unknown (06/18/2023)   Received from [facility], [facility]   Social Network    Social Network: Not on file    Allergies:  Allergies  Allergen Reactions   Gabapentin Swelling   Codeine     REACTION: lethargic   Lamotrigine     REACTION: lips swell   Shellfish Allergy     Some shellfish- is able to eat shrimp but not mussels, clams    Topiramate     REACTION: hallucinations    Current Medications: Current Outpatient Medications  Medication Sig Dispense Refill   albuterol (PROVENTIL HFA;VENTOLIN HFA) 108 (90 BASE) MCG/ACT inhaler Inhale 2 puffs into the lungs every 6 (six) hours as needed. sob      amLODipine (NORVASC) 5 MG tablet Take 1 tablet (5 mg total) by mouth daily. 30 tablet 5   ARIPiprazole ER (ABILIFY MAINTENA) 400 MG PRSY prefilled syringe Inject 400 mg into the muscle every 28 (twenty-eight) days. 1 each 10   aspirin EC 81 MG tablet Take 1 tablet (81 mg total) by mouth daily. Swallow whole.     atorvastatin (LIPITOR) 10 MG tablet Take 1 tablet (10 mg total) by mouth daily. 90 tablet 3   propranolol (INDERAL) 10 MG tablet Take 1 tablet (10 mg total) by mouth 2 (two) times daily. 60 tablet 2   spironolactone (ALDACTONE) 25 MG tablet Take 1 tablet (25 mg total) by mouth daily. 90 tablet 1   Current Facility-Administered Medications  Medication Dose Route Frequency Provider Last Rate Last Admin   ARIPiprazole ER (ABILIFY MAINTENA) 400 MG prefilled syringe 400 mg  400 mg Intramuscular Q28 days    400 mg at 02/18/24 0857     Objective:  Psychiatric Specialty Exam: Physical Exam Constitutional:      Appearance: the patient is not toxic-appearing.  Pulmonary:      Effort: Pulmonary effort is normal.  Neurological:     General: No focal deficit present.     Mental Status: the patient is alert and oriented to person, place, and time.   Review of Systems  Respiratory:  Negative for shortness of breath.   Cardiovascular:  Negative for chest pain.  Gastrointestinal:  Negative for abdominal pain, constipation, diarrhea, nausea and vomiting.  Neurological:  Negative for headaches.      There were no vitals taken for this visit.  General Appearance: Fairly Groomed  Eye Contact:  Good  Speech:  Clear and Coherent  Volume:  Normal  Mood:  Euthymic  Affect:  Congruent  Thought Process:  Coherent  Orientation:  Full (Time, Place, and Person)  Thought Content: Logical   Suicidal Thoughts:  No  Homicidal Thoughts:  No  Memory:  Immediate;   Good  Judgement:  fair  Insight:  fair  Psychomotor Activity:  Normal  Concentration:  Concentration: Good  Recall:  Good  Fund of Knowledge: Good  Language: Good  Akathisia:  No  Handed:    AIMS (if indicated): not done  Assets:  Communication Skills Desire for Improvement Financial Resources/Insurance Housing Leisure Time Physical Health  ADL's:  Intact  Cognition: WNL  Sleep:  Fair     Metabolic Disorder Labs: No results found for: "HGBA1C", "MPG" No results found for: "PROLACTIN" Lab Results  Component Value Date   CHOL 282 (H) 06/12/2023   TRIG 112 06/12/2023   HDL  55 06/12/2023   CHOLHDL 5.1 (H) 06/12/2023   VLDL 26 11/14/2011   LDLCALC 202 (H) 06/12/2023   LDLCALC 82 11/14/2011   Lab Results  Component Value Date   TSH 1.21 06/12/2023   TSH 2.697 11/14/2011    Therapeutic Level Labs: Lab Results  Component Value Date   LITHIUM 0.2 (L) 09/17/2023   LITHIUM 0.27 (L) 11/13/2011   No results found for: "VALPROATE" No results found for: "CBMZ"  Screenings: PHQ2-9    Flowsheet Row Office Visit from 02/06/2024 in [facility]  Visit from 10/25/2023 in [facility] Office Visit from 09/20/2023 in [facility]  PHQ-2 Total Score 0 0 0      Flowsheet Row ED from 11/16/2023 in [facility] at [facility]  C-SSRS RISK CATEGORY No Risk       Collaboration of Care: none  A total of 30 minutes was spent involved in face to face clinical care, chart review, documentation.   Carlyn Reichert, MD 02/19/2024, 2:07 PM

## 2024-02-21 ENCOUNTER — Encounter: Payer: Self-pay | Admitting: Physician Assistant

## 2024-02-21 ENCOUNTER — Other Ambulatory Visit: Payer: Self-pay

## 2024-02-21 ENCOUNTER — Ambulatory Visit: Admitting: Sports Medicine

## 2024-02-21 ENCOUNTER — Ambulatory Visit: Admitting: Physician Assistant

## 2024-02-21 DIAGNOSIS — M25551 Pain in right hip: Secondary | ICD-10-CM

## 2024-02-21 MED ORDER — LIDOCAINE HCL 1 % IJ SOLN
4.0000 mL | INTRAMUSCULAR | Status: AC | PRN
  Administered 2024-02-21: 4 mL

## 2024-02-21 MED ORDER — METHYLPREDNISOLONE ACETATE 40 MG/ML IJ SUSP
80.0000 mg | INTRAMUSCULAR | Status: AC | PRN
  Administered 2024-02-21: 80 mg via INTRA_ARTICULAR

## 2024-02-21 MED ORDER — DICLOFENAC SODIUM 75 MG PO TBEC: 75.0000 mg | DELAYED_RELEASE_TABLET | Freq: Two times a day (BID) | ORAL | 0 refills | Status: DC | PRN

## 2024-02-21 MED ORDER — TRAMADOL HCL 50 MG PO TABS: 50.0000 mg | ORAL_TABLET | Freq: Two times a day (BID) | ORAL | 0 refills | Status: DC | PRN

## 2024-02-21 NOTE — Addendum Note (Signed)
 Addended by: Cristie Hem on: 02/21/2024 03:35 PM   Modules accepted: Orders

## 2024-02-21 NOTE — Progress Notes (Signed)
 Office Visit Note   Patient: [name]           Date of Birth: 12-Mar-1961           MRN: [id number] Visit Date: 02/21/2024              Requested by: Caesar Bookman, NP [address] [place],  [place] [address] PCP: Caesar Bookman, NP   Assessment & Plan: Visit Diagnoses:  1. Pain in right hip     Plan: Impression is right hip pain.  Subjectively, it sounds as though her symptoms are likely coming from her lumbar spine, however when examining her I feel they may be coming from her hip.  I would like to try diagnostic and hopefully therapeutic cortisone injection to the right hip joint.  Referral was made to Dr. Shon Baton for this.  If she does not have any relief even during the anesthetic phase we will look further into her back.  Follow-up as needed.  Follow-Up Instructions: Return if symptoms worsen or fail to improve.   Orders:  No orders of the defined types were placed in this encounter.  Meds ordered this encounter  Medications   traMADol (ULTRAM) 50 MG tablet    Sig: Take 1 tablet (50 mg total) by mouth every 12 (twelve) hours as needed.    Dispense:  30 tablet    Refill:  0      Procedures: No procedures performed   Clinical Data: No additional findings.   Subjective: Chief Complaint  Patient presents with   Right Hip - Pain    HPI patient is a pleasant 63 year old female who comes in today with right lateral hip pain with radiation to the thigh and occasionally down her feet.  Symptoms primarily occur at night when she is sleeping.  She does note it does not matter whether she is lying on the left or right side.  She has not tried taking any medication for pain.  She denies any paresthesias to the right lower extremity.  No bowel or bladder change or saddle paresthesias.  She does have what sounds like a history of a herniated disc at L4-5 which improved with physical therapy.  She tells me she gets a flareup every few years but this feels very  different.  Review of Systems as detailed in HPI.  All others reviewed and are negative.   Objective: Vital Signs: There were no vitals taken for this visit.  Physical Exam well-developed well-nourished female in no acute distress.  Alert and oriented x 3.  Ortho Exam lumbar spine exam: Unremarkable.  Right hip exam: She has pain with logroll and more with FADIR testing.  Negative Stinchfield.  No focal weakness.  She is neurovascularly intact distally.  Specialty Comments:  No specialty comments available.  Imaging: X-rays reviewed by me of the right hip in canopy show no acute or structural abnormalities   PMFS History: Patient Active Problem List   Diagnosis Date Noted   Influenza vaccination declined 02/06/2024   Need for pneumococcal 20-valent conjugate vaccination 02/06/2024   Tremulousness 10/25/2023   HTN (hypertension) 10/25/2023   ADHD 10/25/2023   Inattention 09/03/2023   Akathisia 09/03/2023   Bipolar 1 disorder (HCC) 07/22/2023   Centrilobular emphysema (HCC) 06/20/2023   Atherosclerosis of aorta (HCC) 06/20/2023   Breast asymmetry 02/07/2012   Morbid obesity (HCC) 01/30/2012   Asthma 11/13/2011   History of pulmonary embolism    HYPERLIPIDEMIA 08/22/2010   DYSPNEA 08/22/2010  Past Medical History:  Diagnosis Date   Anginal pain (HCC)    11/2011   Asthma    Bipolar 1 disorder (HCC)    Bipolar affective disorder, depressed, mild (HCC)    Remote history of suicidal attempts   Breast cancer (HCC)    In 2005 treated with surgery and Arimidex for 5 years   Depression    Headache(784.0)    h/o migraines    History of pulmonary embolism    Bilateral in 2001   HNP (herniated nucleus pulposus)    2000- L4-5, used pain mgt. prog. in [place]   Hypertension    Personal history of chemotherapy    Personal history of radiation therapy    Shortness of breath    in the past    Family History  Problem Relation Age of Onset   Breast cancer Mother    Prostate  cancer Father    Diabetes Maternal Aunt    Cancer Maternal Aunt    Parkinson's disease Maternal Grandmother    Breast cancer Paternal Grandmother    Dementia Paternal Grandmother    Cancer Paternal Grandfather    Breast cancer Cousin    Heart attack Brother     Past Surgical History:  Procedure Laterality Date   ABDOMINAL HYSTERECTOMY     BREAST BIOPSY Left 12/2020   BREAST LUMPECTOMY Right    2005   MASTOPEXY  02/07/2012   Procedure: MASTOPEXY;  Surgeon: Wayland Denis, DO;  Location: [facility];  Service: Plastics;  Laterality: Left;  left breast mastopexy/reduction for symmetry after right breast cancer   ORIF ANKLE FRACTURE  06/26/2012   Procedure: OPEN REDUCTION INTERNAL FIXATION (ORIF) ANKLE FRACTURE;  Surgeon: Cammy Copa, MD;  Location: [facility];  Service: Orthopedics;  Laterality: Right;   TUBAL LIGATION     Social History   Occupational History   Not on file  Tobacco Use   Smoking status: Former    Current packs/day: 0.00    Types: Cigarettes    Start date: 06/09/1980    Quit date: 06/09/2010    Years since quitting: 13.7   Smokeless tobacco: Never  Vaping Use   Vaping status: Never Used  Substance and Sexual Activity   Alcohol use: Not Currently    Comment: 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

## 2024-02-21 NOTE — Progress Notes (Signed)
   Procedure Note  Patient: Akela [facility]             Date of Birth: 05-04-1961           MRN: [id number]             Visit Date: 02/21/2024  Procedures: Visit Diagnoses:  1. Pain in right hip    Large Joint Inj: R hip joint on 02/21/2024 3:50 PM Indications: pain Details: 22 G 3.5 in needle, ultrasound-guided anterior approach Medications: 4 mL lidocaine 1 %; 80 mg methylPREDNISolone acetate 40 MG/ML Outcome: tolerated well, no immediate complications  Procedure: US-guided intra-articular hip injection, right After discussion on risks/benefits/indications and informed verbal consent was obtained, a timeout was performed. Patient was lying supine on exam table. The hip was cleaned with betadine and alcohol swabs. Then utilizing ultrasound guidance, the patient's femoral head and neck junction was identified and subsequently injected with 4:2 lidocaine:depomedrol via an in-plane approach with ultrasound visualization of the injectate administered into the hip joint. Patient tolerated procedure well without immediate complications.  Procedure, treatment alternatives, risks and benefits explained, specific risks discussed. Consent was given by the patient. Immediately prior to procedure a time out was called to verify the correct patient, procedure, equipment, support staff and site/side marked as required. Patient was prepped and draped in the usual sterile fashion.     - f/u with Caprice Renshaw as indicated - post-injection protocol discussed - I am happy to see her as needed  Madelyn Brunner, DO Primary Care Sports Medicine Physician  [facility] - Orthopedics  This note was dictated using Dragon naturally speaking software and may contain errors in syntax, spelling, or content which have not been identified prior to signing this note.

## 2024-02-27 ENCOUNTER — Other Ambulatory Visit: Payer: Self-pay

## 2024-02-27 ENCOUNTER — Emergency Department (HOSPITAL_COMMUNITY)

## 2024-02-27 ENCOUNTER — Emergency Department (HOSPITAL_COMMUNITY)
Admission: EM | Admit: 2024-02-27 | Discharge: 2024-02-27 | Disposition: A | Discharge status: Home / Self Care | Attending: Emergency Medicine | Admitting: Emergency Medicine

## 2024-02-27 DIAGNOSIS — M25551 Pain in right hip: Secondary | ICD-10-CM | POA: Insufficient documentation

## 2024-02-27 DIAGNOSIS — Z91010 Allergy to peanuts: Secondary | ICD-10-CM | POA: Insufficient documentation

## 2024-02-27 DIAGNOSIS — M791 Myalgia, unspecified site: Secondary | ICD-10-CM | POA: Diagnosis not present

## 2024-02-27 DIAGNOSIS — Z853 Personal history of malignant neoplasm of breast: Secondary | ICD-10-CM | POA: Insufficient documentation

## 2024-02-27 DIAGNOSIS — Z79899 Other long term (current) drug therapy: Secondary | ICD-10-CM | POA: Diagnosis not present

## 2024-02-27 DIAGNOSIS — Z7982 Long term (current) use of aspirin: Secondary | ICD-10-CM | POA: Diagnosis not present

## 2024-02-27 DIAGNOSIS — M79661 Pain in right lower leg: Secondary | ICD-10-CM | POA: Diagnosis not present

## 2024-02-27 DIAGNOSIS — M160 Bilateral primary osteoarthritis of hip: Secondary | ICD-10-CM | POA: Diagnosis not present

## 2024-02-27 DIAGNOSIS — Z86718 Personal history of other venous thrombosis and embolism: Secondary | ICD-10-CM | POA: Insufficient documentation

## 2024-02-27 DIAGNOSIS — M79604 Pain in right leg: Secondary | ICD-10-CM | POA: Diagnosis not present

## 2024-02-27 DIAGNOSIS — M7918 Myalgia, other site: Secondary | ICD-10-CM

## 2024-02-27 LAB — CBC WITH DIFFERENTIAL/PLATELET
Abs Immature Granulocytes: 0.04 10*3/uL (ref 0.00–0.07)
Basophils Absolute: 0.1 10*3/uL (ref 0.0–0.1)
Basophils Relative: 1 %
Eosinophils Absolute: 0.1 10*3/uL (ref 0.0–0.5)
Eosinophils Relative: 2 %
HCT: 39.8 % (ref 36.0–46.0)
Hemoglobin: 12.5 g/dL (ref 12.0–15.0)
Immature Granulocytes: 1 %
Lymphocytes Relative: 42 %
Lymphs Abs: 2.2 10*3/uL (ref 0.7–4.0)
MCH: 27.8 pg (ref 26.0–34.0)
MCHC: 31.4 g/dL (ref 30.0–36.0)
MCV: 88.4 fL (ref 80.0–100.0)
Monocytes Absolute: 0.3 10*3/uL (ref 0.1–1.0)
Monocytes Relative: 6 %
Neutro Abs: 2.5 10*3/uL (ref 1.7–7.7)
Neutrophils Relative %: 48 %
Platelets: 209 10*3/uL (ref 150–400)
RBC: 4.5 MIL/uL (ref 3.87–5.11)
RDW: 14 % (ref 11.5–15.5)
WBC: 5.2 10*3/uL (ref 4.0–10.5)
nRBC: 0 % (ref 0.0–0.2)

## 2024-02-27 LAB — COMPREHENSIVE METABOLIC PANEL
ALT: 38 U/L (ref 0–44)
AST: 23 U/L (ref 15–41)
Albumin: 3.1 g/dL — ABNORMAL LOW (ref 3.5–5.0)
Alkaline Phosphatase: 76 U/L (ref 38–126)
Anion gap: 6 (ref 5–15)
BUN: 12 mg/dL (ref 8–23)
CO2: 29 mmol/L (ref 22–32)
Calcium: 8.5 mg/dL — ABNORMAL LOW (ref 8.9–10.3)
Chloride: 101 mmol/L (ref 98–111)
Creatinine, Ser: 0.83 mg/dL (ref 0.44–1.00)
GFR, Estimated: >60 mL/min (ref >60)
Glucose, Bld: 98 mg/dL (ref 70–99)
Potassium: 4 mmol/L (ref 3.5–5.1)
Sodium: 136 mmol/L (ref 135–145)
Total Bilirubin: 0.6 mg/dL (ref 0.0–1.2)
Total Protein: 6.3 g/dL — ABNORMAL LOW (ref 6.5–8.1)

## 2024-02-27 LAB — CK: Total CK: 65 U/L (ref 38–234)

## 2024-02-27 LAB — SEDIMENTATION RATE: Sed Rate: 28 mm/h — ABNORMAL HIGH (ref 0–22)

## 2024-02-27 LAB — C-REACTIVE PROTEIN: CRP: 3.3 mg/dL — ABNORMAL HIGH (ref <1.0)

## 2024-02-27 MED ORDER — OXYCODONE-ACETAMINOPHEN 5-325 MG PO TABS
1.0000 | ORAL_TABLET | Freq: Once | ORAL | Status: AC
  Administered 2024-02-27: 1 via ORAL
  Filled 2024-02-27: qty 1

## 2024-02-27 MED ORDER — OXYCODONE-ACETAMINOPHEN 5-325 MG PO TABS: 1.0000 | ORAL_TABLET | Freq: Four times a day (QID) | ORAL | 0 refills | Status: DC | PRN

## 2024-02-27 MED ORDER — LIDOCAINE 5 % EX PTCH: 1.0000 | MEDICATED_PATCH | CUTANEOUS | 0 refills | Status: DC

## 2024-02-27 MED ORDER — FENTANYL CITRATE PF 50 MCG/ML IJ SOSY: 50.0000 ug | PREFILLED_SYRINGE | Freq: Once | INTRAMUSCULAR | Status: DC

## 2024-02-27 NOTE — ED Provider Notes (Signed)
 [place] EMERGENCY DEPARTMENT AT [facility] Provider Note   CSN: [id number] Arrival date & time: 02/27/24  0915     History  Chief Complaint  Patient presents with   Rt hip pain   Leg Pain    BRITTONY BILLICK is a 63 y.o. female.  The history is provided by the patient, the spouse and medical records. No language interpreter was used.  Leg Pain Location:  Hip Time since incident:  3 days Injury: no   Hip location:  R hip Pain details:    Quality:  Aching and sharp   Radiates to:  R leg   Severity:  Severe   Onset quality:  Gradual   Duration:  3 days   Timing:  Intermittent   Progression:  Waxing and waning Chronicity:  New Tetanus status:  Unknown Relieved by:  Nothing Worsened by:  Rotation, extension and flexion Ineffective treatments:  None tried Associated symptoms: no back pain, no decreased ROM, no fatigue, no fever, no itching, no muscle weakness, no neck pain, no numbness, no stiffness, no swelling and no tingling        Home Medications Prior to Admission medications   Medication Sig Start Date End Date Taking? Authorizing Provider  albuterol (PROVENTIL HFA;VENTOLIN HFA) 108 (90 BASE) MCG/ACT inhaler Inhale 2 puffs into the lungs every 6 (six) hours as needed. sob     [provider]  amLODipine (NORVASC) 5 MG tablet Take 1 tablet (5 mg total) by mouth daily. 09/03/23   Ngetich, Dinah C, NP  ARIPiprazole ER (ABILIFY MAINTENA) 400 MG PRSY prefilled syringe Inject 400 mg into the muscle every 28 (twenty-eight) days. 02/17/24   Carlyn Reichert, MD  aspirin EC 81 MG tablet Take 1 tablet (81 mg total) by mouth daily. Swallow whole. 06/20/23   Ngetich, Dinah C, NP  atorvastatin (LIPITOR) 10 MG tablet Take 1 tablet (10 mg total) by mouth daily. 06/20/23   Ngetich, Dinah C, NP  diclofenac (VOLTAREN) 75 MG EC tablet Take 1 tablet (75 mg total) by mouth 2 (two) times daily as needed. 02/21/24   Cristie Hem, PA-C  propranolol (INDERAL)  10 MG tablet Take 1 tablet (10 mg total) by mouth 2 (two) times daily. 02/17/24   Carlyn Reichert, MD  spironolactone (ALDACTONE) 25 MG tablet Take 1 tablet (25 mg total) by mouth daily. 09/20/23   Ngetich, Dinah C, NP  traMADol (ULTRAM) 50 MG tablet Take 1 tablet (50 mg total) by mouth every 12 (twelve) hours as needed. 02/21/24   Cristie Hem, PA-C      Allergies    Gabapentin, Peanut-containing drug products, Codeine, Lamotrigine, Shellfish allergy, and Topiramate    Review of Systems   Review of Systems  Constitutional:  Negative for chills, fatigue and fever.  HENT:  Negative for congestion.   Respiratory:  Negative for cough, chest tightness, shortness of breath and wheezing.   Cardiovascular:  Negative for chest pain, palpitations and leg swelling.  Gastrointestinal:  Negative for abdominal pain, constipation, diarrhea and nausea.  Genitourinary:  Negative for dysuria.  Musculoskeletal:  Negative for back pain, neck pain, neck stiffness and stiffness.  Skin:  Negative for itching, rash and wound.  Neurological:  Negative for dizziness, weakness, light-headedness and headaches.  Psychiatric/Behavioral:  Negative for agitation and confusion.   All other systems reviewed and are negative.   Physical Exam Updated Vital Signs BP (!) 128/91 (BP Location: Left Arm)   Pulse 77   Temp 98.4 F (  36.9 C)   Resp 16   Ht 5\' 5"  (1.651 m)   Wt 115.7 kg   SpO2 90%   BMI 42.43 kg/m  Physical Exam Vitals and nursing note reviewed.  Constitutional:      General: She is not in acute distress.    Appearance: She is well-developed. She is not ill-appearing, toxic-appearing or diaphoretic.  HENT:     Head: Normocephalic and atraumatic.     Nose: No congestion or rhinorrhea.     Mouth/Throat:     Mouth: Mucous membranes are moist.     Pharynx: No oropharyngeal exudate or posterior oropharyngeal erythema.  Eyes:     Extraocular Movements: Extraocular movements intact.      Conjunctiva/sclera: Conjunctivae normal.     Pupils: Pupils are equal, round, and reactive to light.  Cardiovascular:     Rate and Rhythm: Normal rate and regular rhythm.     Heart sounds: No murmur heard. Pulmonary:     Effort: Pulmonary effort is normal. No respiratory distress.     Breath sounds: Normal breath sounds. No wheezing, rhonchi or rales.  Chest:     Chest wall: No tenderness.  Abdominal:     General: Abdomen is flat.     Palpations: Abdomen is soft.     Tenderness: There is no abdominal tenderness. There is no guarding or rebound.  Musculoskeletal:        General: Tenderness present. No swelling.     Cervical back: Neck supple.     Right lower leg: No edema.     Left lower leg: No edema.  Skin:    General: Skin is warm and dry.     Capillary Refill: Capillary refill takes less than 2 seconds.     Findings: No erythema or rash.  Neurological:     General: No focal deficit present.     Mental Status: She is alert.     Sensory: No sensory deficit.     Motor: No weakness.  Psychiatric:        Mood and Affect: Mood normal.     ED Results / Procedures / Treatments   Labs (all labs ordered are listed, but only abnormal results are displayed) Labs Reviewed  COMPREHENSIVE METABOLIC PANEL - Abnormal; Notable for the following components:      Result Value   Calcium 8.5 (*)    Total Protein 6.3 (*)    Albumin 3.1 (*)    All other components within normal limits  SEDIMENTATION RATE - Abnormal; Notable for the following components:   Sed Rate 28 (*)    All other components within normal limits  C-REACTIVE PROTEIN - Abnormal; Notable for the following components:   CRP 3.3 (*)    All other components within normal limits  CBC WITH DIFFERENTIAL/PLATELET  CK  LACTIC ACID, PLASMA  LACTIC ACID, PLASMA    EKG None  Radiology VAS [place] LOWER EXTREMITY VENOUS (DVT) (ONLY [facility] & [facility]) Result Date: 02/27/2024  Lower Venous DVT Study Patient Name:  RINNAH PEPPEL [facility]   Date of Exam:   02/27/2024 Medical Rec #: [id number]                 Accession #:    [id number] Date of Birth: 12-29-1960                  Patient Gender: F Patient Age:   34 years Exam Location:  [facility] Procedure:      VAS [place] LOWER  EXTREMITY VENOUS (DVT) Referring Phys: Lynden Oxford --------------------------------------------------------------------------------  Indications: Pain.  Risk Factors: Hx of PE and cancer. Comparison Study: Previous exam on 01/10/2012 was negative for DVT Performing Technologist: Ernestene Mention RVT, RDMS  Examination Guidelines: A complete evaluation includes B-mode imaging, spectral Doppler, color Doppler, and power Doppler as needed of all accessible portions of each vessel. Bilateral testing is considered an integral part of a complete examination. Limited examinations for reoccurring indications may be performed as noted. The reflux portion of the exam is performed with the patient in reverse Trendelenburg.  +---------+---------------+---------+-----------+----------+--------------+ RIGHT    CompressibilityPhasicitySpontaneityPropertiesThrombus Aging +---------+---------------+---------+-----------+----------+--------------+ CFV      Full           Yes      Yes                                 +---------+---------------+---------+-----------+----------+--------------+ SFJ      Full                                                        +---------+---------------+---------+-----------+----------+--------------+ FV Prox  Full           Yes      Yes                                 +---------+---------------+---------+-----------+----------+--------------+ FV Mid   Full           Yes      Yes                                 +---------+---------------+---------+-----------+----------+--------------+ FV DistalFull           Yes      Yes                                  +---------+---------------+---------+-----------+----------+--------------+ PFV      Full                                                        +---------+---------------+---------+-----------+----------+--------------+ POP      Full           Yes      Yes                                 +---------+---------------+---------+-----------+----------+--------------+ PTV      Full                                                        +---------+---------------+---------+-----------+----------+--------------+ PERO     Full                                                        +---------+---------------+---------+-----------+----------+--------------+   +----+---------------+---------+-----------+----------+--------------+  LEFTCompressibilityPhasicitySpontaneityPropertiesThrombus Aging +----+---------------+---------+-----------+----------+--------------+ CFV Full           Yes      Yes                                 +----+---------------+---------+-----------+----------+--------------+    Summary: RIGHT: - There is no evidence of deep vein thrombosis in the lower extremity.  - No cystic structure found in the popliteal fossa.  LEFT: - No evidence of common femoral vein obstruction.   *See table(s) above for measurements and observations.    Preliminary    DG Hip Unilat  With Pelvis 2-3 Views Right Result Date: 02/27/2024 CLINICAL DATA:  Pain. EXAM: DG HIP (WITH OR WITHOUT PELVIS) 2-3V RIGHT COMPARISON:  02/10/2024. FINDINGS: Pelvis is intact with normal and symmetric sacroiliac joints. No acute fracture or dislocation. No aggressive osseous lesion. Visualized sacral arcuate lines are unremarkable. Unremarkable symphysis pubis. There are mild degenerative changes of bilateral hip joints without significant joint space narrowing. Osteophytosis of the superior acetabulum. No radiopaque foreign bodies. IMPRESSION: No acute osseous abnormality of the pelvis or right hip joint.  Electronically Signed   By: Jules Schick M.D.   On: 02/27/2024 11:08    Procedures Procedures    Medications Ordered in ED Medications  fentaNYL (SUBLIMAZE) injection 50 mcg (50 mcg Intravenous Patient Refused/Not Given 02/27/24 1558)  oxyCODONE-acetaminophen (PERCOCET/ROXICET) 5-325 MG per tablet 1 tablet (1 tablet Oral Given 02/27/24 1028)    ED Course/ Medical Decision Making/ A&P                                 Medical Decision Making Amount and/or Complexity of Data Reviewed Labs: ordered. Radiology: ordered.  Risk Prescription drug management.    Ilda Laskin Chapman-McDavid is a 63 y.o. female with a past medical history significant for pulmonary embolism not on anticoagulation now, previous breast cancer, hypertension, aortic atherosclerosis, hyperlipidemia, and asthma who presents with worsening right leg pain, some right toe tingling, and warmth.  According to patient, she has been having pain in her right leg for the last week or 2 and it has acutely worsened over the last few days.  She reports last Thursday she had an injection anteriorly into her right hip and the pain is significant worsened over the weekend.  She reports from Monday on for the several days she had some improvement in pain but then last night it started rapidly worsening.  She describes it as extremely severe.  It is in her right hip and goes down her leg.  It does not feel electric or shooting down the back of her leg but she had some mild tingling in her toes.  That has resolved now.  She reports the right leg feels warm overall but denies any redness.  She denies significant swelling.  She does have a history of blood clots and has not been on blood thinners in many years.  She denies any pain in her low back and her back is nontender.  She denies any pain in the abdomen.  She denies nausea, vomiting, fevers, chills, chest pain, shortness of breath.   On my exam, she has significant pain when you try to  range her right hip.  She did not have tenderness in the knee or the ankle but she had the pain going into her calf and all over her thigh.  She feels that it is throbbing and very warm compared to the other side.  She denies any rash or redness or shingles.  She denies any abdominal pain back pain or flank pain.  Denies any other weakness or numbness.   Clinically I do feel need to rule out DVT given her history and cancer history.  Will order ultrasound.  Will also get x-ray of the right hip given the pain in the right hip.  As she just had injection several days ago, infection is considered so we will get some screening labs and an ESR and CRP.  We will get a CK given the tenderness and pain she is having in the musculature as well to look for rhabdo or some myositis.   Anticipate full assessment by another team to determine if she needs more advanced imaging or even orthopedic involvement.   Anticipate reassessment after workup.    Patient was placed on my pod after I saw her in triage so I continued her care.  After pain medicine, patient is feeling much better.  She is able to stand up and ambulate and was not having the same pain.  Her CBC shows no white count and normal hemoglobin.  Metabolic panel overall reassuring with slight decrease in calcium.  Normal kidney function.  Ultrasound showed no DVT and x-ray was unremarkable.  No evidence of large effusion. We had a shared decision-making conversation before her ESR and CRP had returned and agreed to hold on more extensive workup with more advanced imaging or even orthopedic consultation for aspiration given her improvement in pain and well appearance now.  We agreed to hold on further workup.  Her ESR and CRP did end up coming back slightly elevated however given our lower concern for infection at this time, we agreed to hold on more extensive workup.  Patient will follow-up with orthopedics team and she understands return precautions and  follow-up instructions.  Patient given prescription for more pain medicine and Lidoderm patches.  Patient discharged in good condition and was feeling much better.        Final Clinical Impression(s) / ED Diagnoses Final diagnoses:  Right leg pain  Musculoskeletal pain    Rx / DC Orders ED Discharge Orders          Ordered    oxyCODONE-acetaminophen (PERCOCET/ROXICET) 5-325 MG tablet  Every 6 hours PRN        02/27/24 1528    lidocaine (LIDODERM) 5 %  Every 24 hours        02/27/24 1528           Clinical Impression: 1. Right leg pain   2. Musculoskeletal pain     Disposition: Discharge  Condition: Good  I have discussed the results, Dx and Tx plan with the pt(& family if present). He/she/they expressed understanding and agree(s) with the plan. Discharge instructions discussed at great length. Strict return precautions discussed and pt &/or family have verbalized understanding of the instructions. No further questions at time of discharge.    Discharge Medication List as of 02/27/2024  3:56 PM     START taking these medications   Details  lidocaine (LIDODERM) 5 % Place 1 patch onto the skin daily. Remove & Discard patch within 12 hours or as directed by MD, Starting Thu 02/27/2024, Normal    oxyCODONE-acetaminophen (PERCOCET/ROXICET) 5-325 MG tablet Take 1 tablet by mouth every 6 (six) hours as needed for severe pain (pain score 7-10)., Starting Thu 02/27/2024, Normal  Follow Up: Caesar Bookman, NP [address] [place] [address] [phone]     your orthopedic team        Chudney Scheffler, Canary Brim, MD 02/27/24 [phone]

## 2024-02-27 NOTE — ED Triage Notes (Signed)
 Pt came in via POV d/t Rt hip pain did report getting an inj. in it & it felt better for a short tip & then last night she woke up with extreme pain from her hip down to her foot & states her leg feels hot, calf is painful & toes are tingling. A/Ox4, rates her pain 8/10 during triage.

## 2024-02-27 NOTE — ED Notes (Signed)
 3 attempts made to obtain labs. Poor venous access. IV team consult placed. Pt adv. Ultrasound is typically needed for access.

## 2024-02-27 NOTE — Discharge Instructions (Signed)
 Your history, exam, and evaluation today led [place] to get imaging labs and ultrasound.  The ultrasound did not show blood clot and the x-ray did not show acute abnormality.  Your labs did not show an elevated white blood cell count and we have low suspicion for an acute infection at this time.  We had a shared decision-making conversation about waiting for the ESR and CRP to return however given your improvement with pain medication and ability to ambulate safely, we feel you are safe for discharge home with close orthopedics follow-up.  Please use the stronger pain medicine to help in the numbing patches.  Please follow-up with your orthopedics team and PCP.  If any symptoms change or worsen acutely, please return to the nearest emergency department.

## 2024-02-27 NOTE — Progress Notes (Signed)
 RLE venous duplex has been completed.  Preliminary results given to Dr. Rush Landmark.   Results can be found under chart review under CV PROC. 02/27/2024 11:19 AM Aldora Perman RVT, RDMS

## 2024-02-27 NOTE — ED Provider Triage Note (Signed)
 Emergency Medicine Provider Triage Evaluation Note  [name] , a 63 y.o. female  was evaluated in triage.  Pt complains of significant pain in her right hip going down her right leg..  Review of Systems  Positive: Right hip pain going down her leg.  Some tingling in the right toes.  Warmth and throbbing. Negative: No trauma, swelling, fevers, chills, chest pain, shortness of breath.  No back pain or abdominal pain.  Physical Exam  BP (!) 128/91 (BP Location: Left Arm)   Pulse 77   Temp 98.4 F (36.9 C)   Resp 16   Ht 5\' 5"  (1.651 m)   Wt 115.7 kg   SpO2 90%   BMI 42.43 kg/m  Gen:   Awake, no distress   Resp:  Normal effort  MSK:   Pain with any hip movement.  Not focal tenderness in the leg aside from some in the thigh.  Intact sensation, strength, and pulses.  No abdominal tenderness or low back tenderness. Other:  No swelling on exam  Medical Decision Making  Medically screening exam initiated at 10:10 AM.  Appropriate orders placed.  [name] was informed that the remainder of the evaluation will be completed by another provider, this initial triage assessment does not replace that evaluation, and the importance of remaining in the ED until their evaluation is complete.  [name] is a 63 y.o. female with a past medical history significant for pulmonary embolism not on anticoagulation now, previous breast cancer, hypertension, aortic atherosclerosis, hyperlipidemia, and asthma who presents with worsening right leg pain, some right toe tingling, and warmth.  According to patient, she has been having pain in her right leg for the last week or 2 and it has acutely worsened over the last few days.  She reports last Thursday she had an injection anteriorly into her right hip and the pain is significant worsened over the weekend.  She reports from Monday on for the several days she had some improvement in pain but then last night it started  rapidly worsening.  She describes it as extremely severe.  It is in her right hip and goes down her leg.  It does not feel electric or shooting down the back of her leg but she had some mild tingling in her toes.  That has resolved now.  She reports the right leg feels warm overall but denies any redness.  She denies significant swelling.  She does have a history of blood clots and has not been on blood thinners in many years.  She denies any pain in her low back and her back is nontender.  She denies any pain in the abdomen.  She denies nausea, vomiting, fevers, chills, chest pain, shortness of breath.  On my exam, she has significant pain when you try to range her right hip.  She did not have tenderness in the knee or the ankle but she had the pain going into her calf and all over her thigh.  She feels that it is throbbing and very warm compared to the other side.  She denies any rash or redness or shingles.  She denies any abdominal pain back pain or flank pain.  Denies any other weakness or numbness.  Clinically I do feel need to rule out DVT given her history and cancer history.  Will order ultrasound.  Will also get x-ray of the right hip given the pain in the right hip.  As she just had injection  several days ago, infection is considered so we will get some screening labs and an ESR and CRP.  We will get a CK given the tenderness and pain she is having in the musculature as well to look for rhabdo or some myositis.  Anticipate full assessment by another team to determine if she needs more advanced imaging or even orthopedic involvement.  Anticipate reassessment after workup.      [name], Canary Brim, MD 02/27/24 1025

## 2024-02-28 ENCOUNTER — Ambulatory Visit: Payer: Self-pay

## 2024-02-28 NOTE — Telephone Encounter (Signed)
  Chief Complaint: Leg pain Symptoms: pain from hip down right leg Frequency: pain initially started 3-4 weeks ago. Pain increased 1.5 weeks ago after receiving a steroid shot Pertinent Negatives: Patient denies CP, SOB Disposition: [] ED /[] Urgent Care (no appt availability in office) / [] Appointment(In office/virtual)/ []  [facility]/ [] Home Care/ [] Refused Recommended Disposition /[]  Mobile Bus/ [x]  Follow-up with PCP Additional Notes: patient with 3-4 weeks history of right leg pain. Patient was sent to orthopedics for a steroid shot to her right leg. This occurred about 1.5 weeks ago. Patient states her pain got worse after the shot. Patient ended up in the ED yesterday-had blood work and ultrasound of her leg down. Patient calling with concerns that her C reactive protein and ESR are slightly elevated. Patient was educated that each test for inflammation. Patient was wanting to be been again in the office. Attempted to make a ED follow up visit with the patient but was unable to with patient driving. Patient is instructed to call back once she is home to be able to look at her calendar. Patient verbalized understanding of plan and all questions answered.    Copied from CRM [id number]. Topic: Clinical - Red Word Triage >> Feb 28, 2024  2:53 PM Dondra Prader A wrote: [facility] that prompted transfer to Nurse Triage: right leg pain, pain has gotten worse, blood work abnormal from ER yesterday. Reason for Disposition  [1] MODERATE pain (e.g., interferes with normal activities, limping) AND [2] present > 3 days  Answer Assessment - Initial Assessment Questions 1. ONSET: "When did the pain start?"      3-4 weeks ago but increased over 1.5 weeks ago 2. LOCATION: "Where is the pain located?"      Right leg pain 3. PAIN: "How bad is the pain?"    (Scale 1-10; or mild, moderate, severe)   -  MILD (1-3): doesn't interfere with normal activities    -  MODERATE (4-7): interferes with  normal activities (e.g., work or school) or awakens from sleep, limping    -  SEVERE (8-10): excruciating pain, unable to do any normal activities, unable to walk     4 out of 10 4. WORK OR EXERCISE: "Has there been any recent work or exercise that involved this part of the body?"      No 5. CAUSE: "What do you think is causing the leg pain?"     unsure 6. OTHER SYMPTOMS: "Do you have any other symptoms?" (e.g., chest pain, back pain, breathing difficulty, swelling, rash, fever, numbness, weakness)     Weaknes when pain level gets too high  Protocols used: Leg Pain-A-AH

## 2024-02-28 NOTE — Telephone Encounter (Signed)
Message routed to PCP Ngetich, Dinah C, NP  

## 2024-02-28 NOTE — Telephone Encounter (Signed)
 MyChart message sent to patient.

## 2024-02-28 NOTE — Telephone Encounter (Signed)
 Please schedule office visit appointment for ED follow up.

## 2024-03-04 ENCOUNTER — Encounter: Payer: Self-pay | Admitting: Internal Medicine

## 2024-03-04 ENCOUNTER — Encounter: Payer: Self-pay | Admitting: Family

## 2024-03-04 ENCOUNTER — Ambulatory Visit (INDEPENDENT_AMBULATORY_CARE_PROVIDER_SITE_OTHER): Admitting: Family

## 2024-03-04 VITALS — BP 136/84 | HR 100 | Temp 97.8°F | Resp 20 | Ht 65.0 in | Wt 262.8 lb

## 2024-03-04 DIAGNOSIS — M25551 Pain in right hip: Secondary | ICD-10-CM | POA: Diagnosis not present

## 2024-03-04 DIAGNOSIS — F902 Attention-deficit hyperactivity disorder, combined type: Secondary | ICD-10-CM | POA: Diagnosis not present

## 2024-03-04 DIAGNOSIS — E441 Mild protein-calorie malnutrition: Secondary | ICD-10-CM

## 2024-03-04 DIAGNOSIS — F3132 Bipolar disorder, current episode depressed, moderate: Secondary | ICD-10-CM | POA: Diagnosis not present

## 2024-03-04 NOTE — Progress Notes (Signed)
 Provider: Richarda Blade FNP-C  Aleksander Edmiston, Donalee Citrin, NP  Patient Care Team: Teigan Manner, Donalee Citrin, NP as PCP - General (Family Medicine)  Extended Emergency Contact Information Primary Emergency Contact: Rosalie Gums [place] Mobile Phone: [phone] Relation: Spouse Preferred language: English Interpreter needed? No Secondary Emergency Contact: Kelly,Dale  [place] Phone: [phone] Relation: Father Preferred language: English Interpreter needed? No  Code Status:  Full Code  Goals of care: Advanced Directive information    03/04/2024    1:24 PM  Advanced Directives  Does Patient Have a Medical Advance Directive? Yes  Type of Estate agent of [place];Living will  Does patient want to make changes to medical advance directive? No - Patient declined  Copy of Healthcare Power of Attorney in Chart? No - copy requested     Chief Complaint  Patient presents with   Hospitalization Follow-up    Hip/ leg pain and lab review    Discussed the use of AI scribe software for clinical note transcription with the patient, who gave verbal consent to proceed.  History of Present Illness   [name] "[name]" is a 63 year old female who presents with right hip pain.  She presents with right hip pain that radiates down the leg, particularly affecting the thigh and hip area. The pain worsens at night, disrupting her sleep as she wakes up multiple times due to the pain. Changing sleeping positions does not relieve the pain, and pain medications provide only temporary relief before the pain returns. She received a cortisone injection two weeks ago, which initially worsened the pain, then provided some relief for a few days before the pain intensified again.  An x-ray performed did not show signs of arthritis or the need for a hip replacement. Laboratory tests revealed a slightly elevated CRP and ESR, indicating  inflammation, while other labs including a comprehensive metabolic panel were mostly normal except for a slight decrease in calcium and protein levels.  She experiences tingling in her right foot, which she associates with the hip pain. Walking seems to alleviate the pain slightly, whereas sitting or lying down for extended periods exacerbates it. No numbness or tingling in the legs except for the right foot. No back pain, tenderness, or issues with bowel movements or constipation.  Her current medications include Abilify injections for bipolar disorder. She plans to pick up Voltaren and Lidoderm patches to help manage the hip pain. She is no longer taking lithium.    Past Medical History:  Diagnosis Date   Anginal pain (HCC)    11/2011   Asthma    Bipolar 1 disorder (HCC)    Bipolar affective disorder, depressed, mild (HCC)    Remote history of suicidal attempts   Breast cancer (HCC)    In 2005 treated with surgery and Arimidex for 5 years   Depression    Headache(784.0)    h/o migraines    History of pulmonary embolism    Bilateral in 2001   HNP (herniated nucleus pulposus)    2000- L4-5, used pain mgt. prog. in [place]   Hypertension    Personal history of chemotherapy    Personal history of radiation therapy    Shortness of breath    in the past   Past Surgical History:  Procedure Laterality Date   ABDOMINAL HYSTERECTOMY     BREAST BIOPSY Left 12/2020   BREAST LUMPECTOMY Right    2005   MASTOPEXY  02/07/2012  Procedure: MASTOPEXY;  Surgeon: Wayland Denis, DO;  Location: [facility];  Service: Plastics;  Laterality: Left;  left breast mastopexy/reduction for symmetry after right breast cancer   ORIF ANKLE FRACTURE  06/26/2012   Procedure: OPEN REDUCTION INTERNAL FIXATION (ORIF) ANKLE FRACTURE;  Surgeon: Cammy Copa, MD;  Location: [facility] OR;  Service: Orthopedics;  Laterality: Right;   TUBAL LIGATION      Allergies  Allergen Reactions   Gabapentin  Swelling   Peanut-Containing Drug Products Anaphylaxis   Codeine     REACTION: lethargic   Lamotrigine     REACTION: lips swell   Shellfish Allergy     Some shellfish- is able to eat shrimp but not mussels, clams    Topiramate     REACTION: hallucinations    Outpatient Encounter Medications as of 03/04/2024  Medication Sig   albuterol (PROVENTIL HFA;VENTOLIN HFA) 108 (90 BASE) MCG/ACT inhaler Inhale 2 puffs into the lungs every 6 (six) hours as needed. sob    amLODipine (NORVASC) 5 MG tablet Take 1 tablet (5 mg total) by mouth daily.   ARIPiprazole ER (ABILIFY MAINTENA) 400 MG PRSY prefilled syringe Inject 400 mg into the muscle every 28 (twenty-eight) days.   atorvastatin (LIPITOR) 10 MG tablet Take 1 tablet (10 mg total) by mouth daily.   propranolol (INDERAL) 10 MG tablet Take 1 tablet (10 mg total) by mouth 2 (two) times daily.   spironolactone (ALDACTONE) 25 MG tablet Take 1 tablet (25 mg total) by mouth daily.   traMADol (ULTRAM) 50 MG tablet Take 1 tablet (50 mg total) by mouth every 12 (twelve) hours as needed.   aspirin EC 81 MG tablet Take 1 tablet (81 mg total) by mouth daily. Swallow whole. (Patient not taking: Reported on 03/04/2024)   diclofenac (VOLTAREN) 75 MG EC tablet Take 1 tablet (75 mg total) by mouth 2 (two) times daily as needed. (Patient not taking: Reported on 03/04/2024)   lidocaine (LIDODERM) 5 % Place 1 patch onto the skin daily. Remove & Discard patch within 12 hours or as directed by MD (Patient not taking: Reported on 03/04/2024)   oxyCODONE-acetaminophen (PERCOCET/ROXICET) 5-325 MG tablet Take 1 tablet by mouth every 6 (six) hours as needed for severe pain (pain score 7-10). (Patient not taking: Reported on 03/04/2024)   Facility-Administered Encounter Medications as of 03/04/2024  Medication   ARIPiprazole ER (ABILIFY MAINTENA) 400 MG prefilled syringe 400 mg    Review of Systems  Constitutional:  Negative for appetite change, chills, fatigue, fever and  unexpected weight change.  Eyes:  Negative for pain, discharge, redness, itching and visual disturbance.  Respiratory:  Negative for cough, chest tightness, shortness of breath and wheezing.   Cardiovascular:  Negative for chest pain, palpitations and leg swelling.  Gastrointestinal:  Negative for abdominal distention, abdominal pain, blood in stool, constipation, diarrhea, nausea and vomiting.  Genitourinary:  Negative for difficulty urinating, dysuria, flank pain, frequency and urgency.  Musculoskeletal:  Positive for arthralgias. Negative for back pain, gait problem, joint swelling, myalgias, neck pain and neck stiffness.       Right hip pain   Skin:  Negative for color change, pallor and rash.  Neurological:  Negative for dizziness, weakness, light-headedness, numbness and headaches.    Immunization History  Administered Date(s) Administered   Influenza Split 11/14/2011, 08/27/2012   Influenza,inj,quad, With Preservative 02/18/2019   PFIZER(Purple Top)SARS-COV-2 Vaccination 03/15/2020, 04/05/2020, 01/02/2021   PNEUMOCOCCAL CONJUGATE-20 02/06/2024   Pertinent  Health Maintenance Due  Topic Date Due  Colonoscopy  Never done   MAMMOGRAM  08/25/2014   INFLUENZA VACCINE  03/09/2024 (Originally 07/11/2023)      06/20/2023    2:35 PM 10/25/2023    3:43 PM 02/06/2024    3:50 PM 03/04/2024    1:23 PM  Fall Risk  Falls in the past year? 0 0 1 1  Was there an injury with Fall? 0 0 0 0  Fall Risk Category Calculator 0 0 1 1  Patient at Risk for Falls Due to No Fall Risks No Fall Risks No Fall Risks No Fall Risks  Fall risk Follow up Falls evaluation completed  Falls evaluation completed Falls evaluation completed   Functional Status Survey:    Vitals:   03/04/24 1329  BP: 136/84  Pulse: 100  Resp: 20  Temp: 97.8 F (36.6 C)  SpO2: 95%  Weight: 262 lb 12.8 oz (119.2 kg)  Height: 5\' 5"  (1.651 m)   Body mass index is 43.73 kg/m. Physical Exam  GENERAL: Alert, cooperative,  well developed, no acute distress. HEENT: Normocephalic, normal oropharynx, moist mucous membranes. CHEST: Clear to auscultation bilaterally, no wheezes, rhonchi, or crackles. CARDIOVASCULAR: Normal heart rate and rhythm, S1 and S2 normal without murmurs. ABDOMEN: Soft, non-tender, non-distended, without organomegaly, normal bowel sounds. EXTREMITIES: No cyanosis, edema, swelling, or tenderness. MUSCULOSKELETAL: No tenderness in the back, no pain in the knee. NEUROLOGICAL: Cranial nerves grossly intact, moves all extremities without gross motor or sensory deficit.      Labs reviewed: Recent Labs    06/12/23 0905 11/16/23 1953 02/27/24 1022  NA 140 138 136  K 4.1 3.9 4.0  CL 105 105 101  CO2 24 21* 29  GLUCOSE 103* 102* 98  BUN 14 11 12   CREATININE 0.77 1.10* 0.83  CALCIUM 9.1 8.8* 8.5*   Recent Labs    06/12/23 0905 02/27/24 1022  AST 12 23  ALT 16 38  ALKPHOS  --  76  BILITOT 0.5 0.6  PROT 6.9 6.3*  ALBUMIN  --  3.1*   Recent Labs    06/12/23 0905 11/16/23 1953 02/27/24 1022  WBC 3.4* 7.1 5.2  NEUTROABS 1,448*  --  2.5  HGB 12.8 12.6 12.5  HCT 38.2 38.7 39.8  MCV 85.3 90.4 88.4  PLT 200 242 209   Lab Results  Component Value Date   TSH 1.21 06/12/2023   No results found for: "HGBA1C" Lab Results  Component Value Date   CHOL 282 (H) 06/12/2023   HDL 55 06/12/2023   LDLCALC 202 (H) 06/12/2023   TRIG 112 06/12/2023   CHOLHDL 5.1 (H) 06/12/2023    Significant Diagnostic Results in last 30 days:  VAS [place] LOWER EXTREMITY VENOUS (DVT) (ONLY [facility] & [facility]) Result Date: 02/28/2024  Lower Venous DVT Study Patient Name:  NARDA FUNDORA [facility]  Date of Exam:   02/27/2024 Medical Rec #: [id number]                 Accession #:    [id number] Date of Birth: [birth year]                  Patient Gender: F Patient Age:   40 years Exam Location:  [facility] Procedure:      VAS [place] LOWER EXTREMITY VENOUS (DVT) Referring Phys: Lynden Oxford  --------------------------------------------------------------------------------  Indications: Pain.  Risk Factors: Hx of PE and cancer. Comparison Study: Previous exam on 01/10/2012 was negative for DVT Performing Technologist: Ernestene Mention RVT, RDMS  Examination Guidelines: A complete evaluation  includes B-mode imaging, spectral Doppler, color Doppler, and power Doppler as needed of all accessible portions of each vessel. Bilateral testing is considered an integral part of a complete examination. Limited examinations for reoccurring indications may be performed as noted. The reflux portion of the exam is performed with the patient in reverse Trendelenburg.  +---------+---------------+---------+-----------+----------+--------------+ RIGHT    CompressibilityPhasicitySpontaneityPropertiesThrombus Aging +---------+---------------+---------+-----------+----------+--------------+ CFV      Full           Yes      Yes                                 +---------+---------------+---------+-----------+----------+--------------+ SFJ      Full                                                        +---------+---------------+---------+-----------+----------+--------------+ FV Prox  Full           Yes      Yes                                 +---------+---------------+---------+-----------+----------+--------------+ FV Mid   Full           Yes      Yes                                 +---------+---------------+---------+-----------+----------+--------------+ FV DistalFull           Yes      Yes                                 +---------+---------------+---------+-----------+----------+--------------+ PFV      Full                                                        +---------+---------------+---------+-----------+----------+--------------+ POP      Full           Yes      Yes                                  +---------+---------------+---------+-----------+----------+--------------+ PTV      Full                                                        +---------+---------------+---------+-----------+----------+--------------+ PERO     Full                                                        +---------+---------------+---------+-----------+----------+--------------+   +----+---------------+---------+-----------+----------+--------------+ LEFTCompressibilityPhasicitySpontaneityPropertiesThrombus Aging +----+---------------+---------+-----------+----------+--------------+ CFV Full  Yes      Yes                                 +----+---------------+---------+-----------+----------+--------------+     Summary: RIGHT: - There is no evidence of deep vein thrombosis in the lower extremity.  - No cystic structure found in the popliteal fossa.  LEFT: - No evidence of common femoral vein obstruction.   *See table(s) above for measurements and observations. Electronically signed by Lemar Livings MD on 02/28/2024 at 2:19:48 PM.    Final    DG Hip Unilat  With Pelvis 2-3 Views Right Result Date: 02/27/2024 CLINICAL DATA:  Pain. EXAM: DG HIP (WITH OR WITHOUT PELVIS) 2-3V RIGHT COMPARISON:  02/10/2024. FINDINGS: Pelvis is intact with normal and symmetric sacroiliac joints. No acute fracture or dislocation. No aggressive osseous lesion. Visualized sacral arcuate lines are unremarkable. Unremarkable symphysis pubis. There are mild degenerative changes of bilateral hip joints without significant joint space narrowing. Osteophytosis of the superior acetabulum. No radiopaque foreign bodies. IMPRESSION: No acute osseous abnormality of the pelvis or right hip joint. Electronically Signed   By: Jules Schick M.D.   On: 02/27/2024 11:08   DG HIP UNILAT W OR W/O PELVIS 2-3 VIEWS RIGHT Result Date: 02/11/2024 CLINICAL DATA:  Right hip pain EXAM: DG HIP (WITH OR WITHOUT PELVIS) 2-3V RIGHT COMPARISON:   None Available. FINDINGS: There is no evidence of hip fracture or dislocation. There is no evidence of arthropathy or other focal bone abnormality. IMPRESSION: Negative. Electronically Signed   By: Shaaron Adler M.D.   On: 02/11/2024 09:00    Assessment/Plan  Right Hip Pain Chronic right hip pain with acute exacerbation, radiating down the right leg, primarily affecting the thigh and hip. Previous cortisone injection provided temporary relief but ultimately worsened the pain. X-ray showed no signs of arthritis or need for hip replacement. Elevated CRP and ESR suggest inflammation. Differential diagnosis includes bursitis, tendinitis, or other inflammatory conditions. Orthopedic evaluation is scheduled for further assessment, potentially including CT or MRI to identify the cause of inflammation. - Attend orthopedic appointment for further evaluation and potential imaging studies. - Use Lidoderm patch and Voltaren gel for pain management, especially at night.  Mild protein malnutrition  Slightly low calcium, protein, and albumin levels. She is allergic to nuts, limiting dietary options. Recommended increasing plant-based protein sources to improve nutritional status without raising cholesterol levels. Discussed dietary adjustments to include beans and seeds as protein sources. - Increase intake of plant-based proteins such as beans and seeds to improve protein levels. - Monitor calcium and protein levels in future lab work.  General Health Maintenance Awaiting mammogram films from [place] for scheduling. Referral for colonoscopy has been made but not yet scheduled. Sleep study consultation for possible sleep apnea is scheduled. Genetic testing and neuropathy evaluation for hand tremors are also scheduled. - Follow up on mammogram films and schedule the mammogram once received. - Contact for scheduling the colonoscopy. - Attend scheduled appointments for sleep study consultation, genetic testing, and  neuropathy evaluation.   Family/ staff Communication: Reviewed plan of care with patient verbalized understanding   Labs/tests ordered: None   Next Appointment: Return if symptoms worsen or fail to improve.  Total time:20 minutes. Greater than 50% of total time spent doing patient education regarding right hip pain,health maintenance including symptom/medication management.   Caesar Bookman, NP

## 2024-03-05 ENCOUNTER — Ambulatory Visit (INDEPENDENT_AMBULATORY_CARE_PROVIDER_SITE_OTHER): Admitting: Physician Assistant

## 2024-03-05 ENCOUNTER — Other Ambulatory Visit (INDEPENDENT_AMBULATORY_CARE_PROVIDER_SITE_OTHER): Payer: Self-pay

## 2024-03-05 ENCOUNTER — Encounter: Payer: Self-pay | Admitting: Physician Assistant

## 2024-03-05 DIAGNOSIS — M79604 Pain in right leg: Secondary | ICD-10-CM

## 2024-03-05 MED ORDER — METHOCARBAMOL 750 MG PO TABS: 750.0000 mg | ORAL_TABLET | Freq: Three times a day (TID) | ORAL | 1 refills | Status: DC | PRN

## 2024-03-05 MED ORDER — PREDNISONE 10 MG (21) PO TBPK: ORAL_TABLET | ORAL | 0 refills | Status: DC

## 2024-03-05 NOTE — Progress Notes (Signed)
 Office Visit Note   Patient: [name]           Date of Birth: 09/02/1961           MRN: [id number] Visit Date: 03/05/2024              Requested by: Caesar Bookman, NP [address] [place],  [place] [address] PCP: Caesar Bookman, NP   Assessment & Plan: Visit Diagnoses:  1. Pain in right leg     Plan: Impression is worsening right lateral hip and leg pain now with paresthesias to the foot.  Unfortunately, the diagnostic right hip intra-articular cortisone injection did not provide any relief.  I feel her symptoms are likely from lumbar radiculopathy although she does not have any actual back pain.  We have discussed starting her on a steroid pack and muscle relaxer as well as sending her to physical therapy.  If her symptoms do not improve over the next 6 to 8 weeks she will let us know we will get an MRI of the lumbar spine to assess for structural abnormalities.  Otherwise, follow-up as needed.  Follow-Up Instructions: Return if symptoms worsen or fail to improve.   Orders:  Orders Placed This Encounter  Procedures   XR Lumbar Spine 2-3 Views   Ambulatory referral to Physical Therapy   Meds ordered this encounter  Medications   predniSONE (STERAPRED UNI-PAK 21 TAB) 10 MG (21) TBPK tablet    Sig: Take as directed    Dispense:  21 tablet    Refill:  0   methocarbamol (ROBAXIN-750) 750 MG tablet    Sig: Take 1 tablet (750 mg total) by mouth 3 (three) times daily as needed for muscle spasms.    Dispense:  20 tablet    Refill:  1      Procedures: No procedures performed   Clinical Data: No additional findings.   Subjective: Chief Complaint  Patient presents with   Right Hip - Pain    HPI patient is a very pleasant 62 year old female who comes in with continued right lateral hip pain, right anterior thigh pain which is now radiating distal to the knee.  She has began having a burning sensation to the thigh as well as tingling to her feet.  She was  seen by me few weeks ago for this.  At that time, it sounded it was likely from her back however her exam was more consistent with hip pathology.  She was referred to Dr. Shon Baton for ultrasound-guided cortisone injection.  She did not have any relief with this injection even during the anesthetic phase.  She is continuing to have pain worse with sitting and lying down.  She denies any bowel or bladder change or saddle paresthesias.  She has been taking occasional Oxy for the pain.  She does have a history of L4-5 herniation.  This improved with physical therapy years ago.  Review of Systems as detailed in HPI.  All others reviewed and are negative.   Objective: Vital Signs: There were no vitals taken for this visit.  Physical Exam well-developed well-nourished female no acute distress.  Alert and oriented x 3.  Ortho Exam unchanged lumbar spine exam  Specialty Comments:  No specialty comments available.  Imaging: XR Lumbar Spine 2-3 Views Result Date: 03/05/2024 Advanced degenerative disc disease L5-S1    PMFS History: Patient Active Problem List   Diagnosis Date Noted   Influenza vaccination declined 02/06/2024   Need for  pneumococcal 20-valent conjugate vaccination 02/06/2024   Tremulousness 10/25/2023   HTN (hypertension) 10/25/2023   ADHD 10/25/2023   Inattention 09/03/2023   Akathisia 09/03/2023   Bipolar 1 disorder (HCC) 07/22/2023   Centrilobular emphysema (HCC) 06/20/2023   Atherosclerosis of aorta (HCC) 06/20/2023   Breast asymmetry 02/07/2012   Morbid obesity (HCC) 01/30/2012   Asthma 11/13/2011   History of pulmonary embolism    HYPERLIPIDEMIA 08/22/2010   DYSPNEA 08/22/2010   Past Medical History:  Diagnosis Date   Anginal pain (HCC)    11/2011   Asthma    Bipolar 1 disorder (HCC)    Bipolar affective disorder, depressed, mild (HCC)    Remote history of suicidal attempts   Breast cancer (HCC)    In 2005 treated with surgery and Arimidex for 5 years    Depression    Headache(784.0)    h/o migraines    History of pulmonary embolism    Bilateral in 2001   HNP (herniated nucleus pulposus)    2000- L4-5, used pain mgt. prog. in [place]   Hypertension    Personal history of chemotherapy    Personal history of radiation therapy    Shortness of breath    in the past    Family History  Problem Relation Age of Onset   Breast cancer Mother    Prostate cancer Father    Diabetes Maternal Aunt    Cancer Maternal Aunt    Parkinson's disease Maternal Grandmother    Breast cancer Paternal Grandmother    Dementia Paternal Grandmother    Cancer Paternal Grandfather    Breast cancer Cousin    Heart attack Brother     Past Surgical History:  Procedure Laterality Date   ABDOMINAL HYSTERECTOMY     BREAST BIOPSY Left 12/2020   BREAST LUMPECTOMY Right    2005   MASTOPEXY  02/07/2012   Procedure: MASTOPEXY;  Surgeon: Wayland Denis, DO;  Location: [facility];  Service: Plastics;  Laterality: Left;  left breast mastopexy/reduction for symmetry after right breast cancer   ORIF ANKLE FRACTURE  06/26/2012   Procedure: OPEN REDUCTION INTERNAL FIXATION (ORIF) ANKLE FRACTURE;  Surgeon: Cammy Copa, MD;  Location: [facility] OR;  Service: Orthopedics;  Laterality: Right;   TUBAL LIGATION     Social History   Occupational History   Not on file  Tobacco Use   Smoking status: Former    Current packs/day: 0.00    Types: Cigarettes    Start date: 06/09/1980    Quit date: 06/09/2010    Years since quitting: 13.7   Smokeless tobacco: Never  Vaping Use   Vaping status: Never Used  Substance and Sexual Activity   Alcohol use: Not Currently    Comment: 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

## 2024-03-13 ENCOUNTER — Other Ambulatory Visit: Payer: Self-pay | Admitting: Family

## 2024-03-13 DIAGNOSIS — N6320 Unspecified lump in the left breast, unspecified quadrant: Secondary | ICD-10-CM

## 2024-03-13 DIAGNOSIS — Z1231 Encounter for screening mammogram for malignant neoplasm of breast: Secondary | ICD-10-CM

## 2024-03-17 ENCOUNTER — Ambulatory Visit (HOSPITAL_BASED_OUTPATIENT_CLINIC_OR_DEPARTMENT_OTHER)

## 2024-03-17 VITALS — BP 142/90 | HR 102 | Ht 65.0 in | Wt 262.0 lb

## 2024-03-17 DIAGNOSIS — F319 Bipolar disorder, unspecified: Secondary | ICD-10-CM

## 2024-03-17 NOTE — Progress Notes (Signed)
 Patient arrives today for her due injection of Abilify Maintena 400 mg. Patient brought the shot with her this time, she had no trouble getting it. Patient presents in a good mood, well groomed with an appropriate affect. Patient denies any SI/HI or AVH. Injection was prepared as ordered and administered in the Left Deltoid per the patient  - she can not do the Right arm due to having lymph nodes removed and will not get it in her glut. Patient says the injection site is fine and she has no issues with pain or bumps after her shot. Patient will return in 28 days for her next due injection     NDC: 59148-072-80 LOT: [id number] EXP: SEP 2026

## 2024-03-23 ENCOUNTER — Telehealth (HOSPITAL_COMMUNITY): Admitting: Student

## 2024-03-23 ENCOUNTER — Ambulatory Visit (AMBULATORY_SURGERY_CENTER)

## 2024-03-23 VITALS — Ht 65.0 in | Wt 250.0 lb

## 2024-03-23 DIAGNOSIS — Z1211 Encounter for screening for malignant neoplasm of colon: Secondary | ICD-10-CM

## 2024-03-23 MED ORDER — NA SULFATE-K SULFATE-MG SULF 17.5-3.13-1.6 GM/177ML PO SOLN: 1.0000 | Freq: Once | ORAL | 0 refills | Status: AC

## 2024-03-23 NOTE — Progress Notes (Signed)

## 2024-03-27 ENCOUNTER — Ambulatory Visit
Admission: RE | Admit: 2024-03-27 | Discharge: 2024-03-27 | Disposition: A | Discharge status: Home / Self Care | Source: Ambulatory Visit | Attending: Family | Admitting: Family

## 2024-03-27 DIAGNOSIS — N6323 Unspecified lump in the left breast, lower outer quadrant: Secondary | ICD-10-CM | POA: Diagnosis not present

## 2024-03-27 DIAGNOSIS — N6320 Unspecified lump in the left breast, unspecified quadrant: Secondary | ICD-10-CM

## 2024-04-01 ENCOUNTER — Telehealth: Payer: Self-pay | Admitting: Hematology and Oncology

## 2024-04-01 NOTE — Telephone Encounter (Signed)
 Left patient a vm regarding upcoming appointment

## 2024-04-08 ENCOUNTER — Telehealth: Payer: Self-pay | Admitting: Pediatrics

## 2024-04-08 MED ORDER — ONDANSETRON 8 MG PO TBDP: 8.0000 mg | ORAL_TABLET | Freq: Three times a day (TID) | ORAL | 0 refills | Status: DC | PRN

## 2024-04-08 NOTE — Telephone Encounter (Signed)
 I spoke on the telephone with [name] who contacted the on-call pager regarding difficulty tolerating bowel preparation for her colonoscopy tomorrow with nausea and vomiting.  Discussed sending in a prescription for Zofran  8 mg tablets that she can take every 8 hours as needed for nausea and vomiting.  Prescription will be sent in to her preferred [facility] pharmacy. Also advised to drink the bowel prep slowly.

## 2024-04-09 ENCOUNTER — Encounter: Payer: Self-pay | Admitting: Genetic Counselor

## 2024-04-09 ENCOUNTER — Other Ambulatory Visit: Payer: Self-pay | Admitting: Genetic Counselor

## 2024-04-09 ENCOUNTER — Ambulatory Visit: Admitting: Internal Medicine

## 2024-04-09 ENCOUNTER — Inpatient Hospital Stay: Attending: Genetic Counselor | Admitting: Genetic Counselor

## 2024-04-09 ENCOUNTER — Encounter: Payer: Self-pay | Admitting: Internal Medicine

## 2024-04-09 ENCOUNTER — Inpatient Hospital Stay

## 2024-04-09 VITALS — BP 169/95 | HR 76 | Temp 98.1°F | Resp 18 | Ht 65.0 in | Wt 250.0 lb

## 2024-04-09 DIAGNOSIS — Z1211 Encounter for screening for malignant neoplasm of colon: Secondary | ICD-10-CM

## 2024-04-09 DIAGNOSIS — D123 Benign neoplasm of transverse colon: Secondary | ICD-10-CM

## 2024-04-09 DIAGNOSIS — K635 Polyp of colon: Secondary | ICD-10-CM | POA: Diagnosis not present

## 2024-04-09 DIAGNOSIS — D120 Benign neoplasm of cecum: Secondary | ICD-10-CM

## 2024-04-09 DIAGNOSIS — Z8042 Family history of malignant neoplasm of prostate: Secondary | ICD-10-CM

## 2024-04-09 DIAGNOSIS — D124 Benign neoplasm of descending colon: Secondary | ICD-10-CM

## 2024-04-09 DIAGNOSIS — K5730 Diverticulosis of large intestine without perforation or abscess without bleeding: Secondary | ICD-10-CM

## 2024-04-09 DIAGNOSIS — K648 Other hemorrhoids: Secondary | ICD-10-CM

## 2024-04-09 DIAGNOSIS — Z1379 Encounter for other screening for genetic and chromosomal anomalies: Secondary | ICD-10-CM

## 2024-04-09 DIAGNOSIS — Z803 Family history of malignant neoplasm of breast: Secondary | ICD-10-CM

## 2024-04-09 DIAGNOSIS — Z853 Personal history of malignant neoplasm of breast: Secondary | ICD-10-CM

## 2024-04-09 LAB — GENETIC SCREENING ORDER

## 2024-04-09 MED ORDER — SODIUM CHLORIDE 0.9 % IV SOLN: 500.0000 mL | Freq: Once | INTRAVENOUS | Status: DC

## 2024-04-09 NOTE — Progress Notes (Signed)
 Pt's states no medical or surgical changes since previsit or office visit.

## 2024-04-09 NOTE — Progress Notes (Signed)
 REFERRING PROVIDER: Estil Heman, NP [address] [place],  [place] [address]  PRIMARY PROVIDER:  Ngetich, Dinah C, NP  PRIMARY REASON FOR VISIT:  1. Personal history of breast cancer   2. Family history of breast cancer   3. Family history of prostate cancer    HISTORY OF PRESENT ILLNESS:   [name], a 63 y.o. female, was seen for a [facility] cancer genetics consultation at the request of Dr. Arlis Lakes due to a personal and family history of cancer.  [name] presents to clinic today to discuss the possibility of a hereditary predisposition to cancer, to discuss genetic testing, and to further clarify her future cancer risks, as well as potential cancer risks for family members.   CANCER HISTORY:  In 2005, at the age of 35, [name] was diagnosed with breast cancer. She reports negative BRCA1/2 genetic testing.   RISK FACTORS:  First live birth at age 53.  OCP use for approximately  15  years.  Ovaries intact: no.  Uterus intact: no.  Menopausal status: postmenopausal.  HRT use: 0 years. Colonoscopy: she reports she had one colonoscopy in the past with benign polyps and she has one scheduled for today Mammogram within the last year: yes. Any excessive radiation exposure in the past: no  Past Medical History:  Diagnosis Date   Anginal pain (HCC)    11/2011   Asthma    Bipolar 1 disorder (HCC)    Bipolar affective disorder, depressed, mild (HCC)    Remote history of suicidal attempts   Breast cancer (HCC)    In 2005 treated with surgery and Arimidex for 5 years   Depression    Headache(784.0)    h/o migraines    History of pulmonary embolism    Bilateral in 2001   HNP (herniated nucleus pulposus)    2000- L4-5, used pain mgt. prog. in [place]    Hypertension    Personal history of chemotherapy    Personal history of radiation therapy    Shortness of breath    in the past    Past Surgical History:  Procedure Laterality Date   ABDOMINAL  HYSTERECTOMY     BREAST BIOPSY Left 12/2020   BREAST LUMPECTOMY Right    2005   MASTOPEXY  02/07/2012   Procedure: MASTOPEXY;  Surgeon: Marilou Showman, DO;  Location: [facility];  Service: Plastics;  Laterality: Left;  left breast mastopexy/reduction for symmetry after right breast cancer   ORIF ANKLE FRACTURE  06/26/2012   Procedure: OPEN REDUCTION INTERNAL FIXATION (ORIF) ANKLE FRACTURE;  Surgeon: Jasmine Mesi, MD;  Location: [facility] OR;  Service: Orthopedics;  Laterality: Right;   REDUCTION MAMMAPLASTY Left    TUBAL LIGATION      Social History   Socioeconomic History   Marital status: Married    Spouse name: Not on file   Number of children: Not on file   Years of education: Not on file   Highest education level: Not on file  Occupational History   Not on file  Tobacco Use   Smoking status: Former    Current packs/day: 0.00    Types: Cigarettes    Quit date: 06/09/2010    Years since quitting: 13.8   Smokeless tobacco: Never  Vaping Use   Vaping status: Never Used  Substance and Sexual Activity   Alcohol use: Not Currently    Comment: 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 2      Pets: No      Highest level or education completed: Law school      Current/Past profession: Librarian, academic of Domestic Violence Banker       Exercise: No                 Type and how often:          Living Will: Yes   DNR: Yes   POA/HPOA: Yes      Functional Status:   Do you have difficulty bathing or dressing yourself?  No   Do you have difficulty preparing food or eating? No   Do you have difficulty managing your medications? No   Do you have difficulty managing your finances? No   Do you have difficulty affording your medications? No   Social Drivers of Corporate investment banker Strain: Medium Risk (11/06/2019)   Received [facility], [facility]   Overall Financial Resource Strain (CARDIA)    Difficulty of Paying Living Expenses: Somewhat hard  Food Insecurity: No Food Insecurity (11/06/2019)   Received from [facility], [facility]   Hunger Vital Sign    Worried About Running Out of Food in the Last Year: Never true    Ran Out of Food in the Last Year: Never true  Transportation Needs: No Transportation Needs (11/06/2019)   Received from [facility], [facility] - Transportation    Lack of Transportation (Medical): No    Lack of Transportation (Non-Medical): No  Physical Activity: Inactive (03/25/2019)   Received from [facility], [facility]   Exercise Vital Sign    Days of Exercise per Week: 0 days    Minutes of Exercise per Session: 0 min  Stress: Stress Concern Present (03/25/2019)   Received from [facility], [facility] Occup[facility] - Occupational Stress Questionnaire    Feeling of Stress : Very much  Social Connections: Unknown (06/18/2023)   Received from [facility], [facility]   Social Network    Social Network: Not on file     FAMILY HISTORY:  We obtained a detailed, 4-generation family history.  Significant diagnoses are listed below: Family History  Problem Relation Age of Onset   Breast cancer Mother 78   Prostate cancer Father 42   Heart attack Brother    Diabetes Maternal Aunt    Cancer Paternal Aunt    Parkinson's disease Maternal Grandmother    Breast cancer Paternal Grandmother 43 - 6   Dementia Paternal Grandmother    Breast cancer Cousin 85 - 72   Breast cancer  Other 36 - 80       maternal great aunt      [name] is unaware of previous family history of genetic testing for hereditary cancer risks. There is no reported Ashkenazi Jewish ancestry.   GENETIC COUNSELING ASSESSMENT: Ms. Loe is a 63 y.o. female with  a personal and family history of cancer which is somewhat suggestive of a hereditary predisposition to cancer. We, therefore, discussed and recommended the following at today's visit.   DISCUSSION: We discussed that 5 - 10% of cancer is hereditary, with most cases of breast cancer associated with BRCA1/2.  There are other genes that can be associated with hereditary breast cancer syndromes.  We discussed that testing is beneficial for several reasons including knowing how to follow individuals after completing their treatment and understanding if other family members could be at risk for cancer and allowing them to undergo genetic testing.   We reviewed the characteristics, features and inheritance patterns of hereditary cancer syndromes. We also discussed genetic testing, including the appropriate family members to test, the process of testing, insurance coverage and turn-around-time for results. We discussed the implications of a negative, positive, carrier and/or variant of uncertain significant result. We recommended [name] pursue genetic testing for a panel that includes genes associated with breast and prostate cancer.   Ms. Ozog  was offered a common hereditary cancer panel (48 genes) and an expanded pan-cancer panel (77 genes). Ms. Vermilyea was informed of the benefits and limitations of each panel, including that expanded pan-cancer panels contain genes that do not have clear management guidelines at this point in time.  We also discussed that as the number of genes included on a panel increases, the chances of variants of uncertain significance increases. After considering the benefits and  limitations of each gene panel, [name] elected to have [facility] CancerNext-Expanded Panel.   The CancerNext-Expanded gene panel offered by [facility] and includes sequencing, rearrangement, and RNA analysis for the following 77 genes: AIP, ALK, APC, ATM, AXIN2, BAP1, BARD1, BMPR1A, BRCA1, BRCA2, BRIP1, CDC73, CDH1, CDK4, CDKN1B, CDKN2A, CEBPA, CHEK2, CTNNA1, DDX41, DICER1, ETV6, FH, FLCN, GATA2, LZTR1, MAX, MBD4, MEN1, MET, MLH1, MSH2, MSH3, MSH6, MUTYH, NF1, NF2, NTHL1, PALB2, PHOX2B, PMS2, POT1, PRKAR1A, PTCH1, PTEN, RAD51C, RAD51D, RB1, RET, RPS20, RUNX1, SDHA, SDHAF2, SDHB, SDHC, SDHD, SMAD4, SMARCA4, SMARCB1, SMARCE1, STK11, SUFU, TMEM127, TP53, TSC1, TSC2, VHL, and WT1 (sequencing and deletion/duplication); EGFR, HOXB13, KIT, MITF, PDGFRA, POLD1, and POLE (sequencing only); EPCAM and GREM1 (deletion/duplication only).   Based on [name]'s personal and family history of cancer, she meets medical criteria for genetic testing. Despite that she meets criteria, she may still have an out of pocket cost. We discussed that if her out of pocket cost for testing is over $100, the laboratory should contact them to discuss self-pay prices, patient pay assistance programs, if applicable, and other billing options.  PLAN: After considering the risks, benefits, and limitations, [name] provided informed consent to pursue genetic testing and the blood sample was sent to [facility] for analysis of the CancerNext-Expanded Panel. Results should be available within approximately 2-3 weeks' time, at which point they will be disclosed by telephone to [name], as will any additional recommendations warranted by these results. Ms. Dahan will receive a summary of her genetic counseling visit and a copy of her results once available. This information will also be available in Epic.   Ms. Derose questions were answered to her satisfaction today. Our  contact information was provided should additional questions or concerns arise. Thank you for the referral and allowing us  to share in the care of your patient.   Khalila Buechner, MS, [facility] Genetic Counselor [email or web address] (P) [phone]  60 minutes were spent on the date of the encounter in service to the patient including preparation, face-to-face  consultation, documentation and care coordination. The patient brought her husband.  Drs. Gudena and/or Maryalice Smaller were available to discuss this case as needed.   _______________________________________________________________________ For Office Staff:  Number of people involved in session: 2 Was an Intern/ student involved with case: no

## 2024-04-09 NOTE — Progress Notes (Signed)
 GASTROENTEROLOGY PROCEDURE H&P NOTE   Primary Care Physician: Ngetich, Elijio Guadeloupe, NP    Reason for Procedure:   Colon cancer screening  Plan:    Colonoscopy  Patient is appropriate for endoscopic procedure(s) in the ambulatory ([facility]) setting.  The nature of the procedure, as well as the risks, benefits, and alternatives were carefully and thoroughly reviewed with the patient. Ample time for discussion and questions allowed. The patient understood, was satisfied, and agreed to proceed.     HPI: [name] is a 63 y.o. female who presents for colonoscopy for colon cancer screening. Denies blood in stools, changes in bowel habits, or unintentional weight loss. Denies family history of colon cancer. Last colonoscopy was 8-10 years ago that had benign polyps and she was recommended for a 10 year follow up.   Past Medical History:  Diagnosis Date   Anginal pain (HCC)    11/2011   Asthma    Bipolar 1 disorder (HCC)    Bipolar affective disorder, depressed, mild (HCC)    Remote history of suicidal attempts   Breast cancer (HCC)    In 2005 treated with surgery and Arimidex for 5 years   Depression    Headache(784.0)    h/o migraines    History of pulmonary embolism    Bilateral in 2001   HNP (herniated nucleus pulposus)    2000- L4-5, used pain mgt. prog. in [place]    Hypertension    Personal history of chemotherapy    Personal history of radiation therapy    Shortness of breath    in the past    Past Surgical History:  Procedure Laterality Date   ABDOMINAL HYSTERECTOMY     BREAST BIOPSY Left 12/2020   BREAST LUMPECTOMY Right    2005   MASTOPEXY  02/07/2012   Procedure: MASTOPEXY;  Surgeon: Marilou Showman, DO;  Location: [facility];  Service: Plastics;  Laterality: Left;  left breast mastopexy/reduction for symmetry after right breast cancer   ORIF ANKLE FRACTURE  06/26/2012   Procedure: OPEN REDUCTION INTERNAL FIXATION (ORIF) ANKLE FRACTURE;   Surgeon: Jasmine Mesi, MD;  Location: [facility] OR;  Service: Orthopedics;  Laterality: Right;   REDUCTION MAMMAPLASTY Left    TUBAL LIGATION      Prior to Admission medications   Medication Sig Start Date End Date Taking? Authorizing Provider  amLODipine  (NORVASC ) 5 MG tablet Take 1 tablet (5 mg total) by mouth daily. 09/03/23  Yes Ngetich, Dinah C, NP  aspirin  EC 81 MG tablet Take 1 tablet (81 mg total) by mouth daily. Swallow whole. 06/20/23  Yes Ngetich, Dinah C, NP  atorvastatin  (LIPITOR) 10 MG tablet Take 1 tablet (10 mg total) by mouth daily. 06/20/23  Yes Ngetich, Dinah C, NP  oxyCODONE -acetaminophen  (PERCOCET/ROXICET) 5-325 MG tablet Take 1 tablet by mouth every 6 (six) hours as needed for severe pain (pain score 7-10). 02/27/24  Yes Tegeler, Marine Sia, MD  spironolactone  (ALDACTONE ) 25 MG tablet Take 1 tablet (25 mg total) by mouth daily. 09/20/23  Yes Ngetich, Dinah C, NP  albuterol  (PROVENTIL  HFA;VENTOLIN  HFA) 108 (90 BASE) MCG/ACT inhaler Inhale 2 puffs into the lungs every 6 (six) hours as needed. sob     [provider]  ARIPiprazole  ER (ABILIFY  MAINTENA) 400 MG PRSY prefilled syringe Inject 400 mg into the muscle every 28 (twenty-eight) days. 02/17/24   Marilou Showman, MD  diclofenac  (VOLTAREN ) 75 MG EC tablet Take 1 tablet (75 mg total) by mouth 2 (two) times daily  as needed. Patient not taking: Reported on 04/09/2024 02/21/24   Sandie Cross, PA-C  lidocaine  (LIDODERM ) 5 % Place 1 patch onto the skin daily. Remove & Discard patch within 12 hours or as directed by MD Patient not taking: Reported on 04/09/2024 02/27/24   Tegeler, Marine Sia, MD  methocarbamol  (ROBAXIN -750) 750 MG tablet Take 1 tablet (750 mg total) by mouth 3 (three) times daily as needed for muscle spasms. 03/05/24   Sandie Cross, PA-C  ondansetron  (ZOFRAN -ODT) 8 MG disintegrating tablet Take 1 tablet (8 mg total) by mouth every 8 (eight) hours as needed for nausea or vomiting. 04/08/24   McGreal, Scarlette Currier, MD  propranolol  (INDERAL ) 10 MG tablet Take 1 tablet (10 mg total) by mouth 2 (two) times daily. Patient not taking: Reported on 04/09/2024 02/17/24   Marilou Showman, MD  traMADol  (ULTRAM ) 50 MG tablet Take 1 tablet (50 mg total) by mouth every 12 (twelve) hours as needed. 02/21/24   Sandie Cross, PA-C    Current Outpatient Medications  Medication Sig Dispense Refill   amLODipine  (NORVASC ) 5 MG tablet Take 1 tablet (5 mg total) by mouth daily. 30 tablet 5   aspirin  EC 81 MG tablet Take 1 tablet (81 mg total) by mouth daily. Swallow whole.     atorvastatin  (LIPITOR) 10 MG tablet Take 1 tablet (10 mg total) by mouth daily. 90 tablet 3   oxyCODONE -acetaminophen  (PERCOCET/ROXICET) 5-325 MG tablet Take 1 tablet by mouth every 6 (six) hours as needed for severe pain (pain score 7-10). 15 tablet 0   spironolactone  (ALDACTONE ) 25 MG tablet Take 1 tablet (25 mg total) by mouth daily. 90 tablet 1   albuterol  (PROVENTIL  HFA;VENTOLIN  HFA) 108 (90 BASE) MCG/ACT inhaler Inhale 2 puffs into the lungs every 6 (six) hours as needed. sob      ARIPiprazole  ER (ABILIFY  MAINTENA) 400 MG PRSY prefilled syringe Inject 400 mg into the muscle every 28 (twenty-eight) days. 1 each 10   diclofenac  (VOLTAREN ) 75 MG EC tablet Take 1 tablet (75 mg total) by mouth 2 (two) times daily as needed. (Patient not taking: Reported on 04/09/2024) 60 tablet 0   lidocaine  (LIDODERM ) 5 % Place 1 patch onto the skin daily. Remove & Discard patch within 12 hours or as directed by MD (Patient not taking: Reported on 04/09/2024) 15 patch 0   methocarbamol  (ROBAXIN -750) 750 MG tablet Take 1 tablet (750 mg total) by mouth 3 (three) times daily as needed for muscle spasms. 20 tablet 1   ondansetron  (ZOFRAN -ODT) 8 MG disintegrating tablet Take 1 tablet (8 mg total) by mouth every 8 (eight) hours as needed for nausea or vomiting. 10 tablet 0   propranolol  (INDERAL ) 10 MG tablet Take 1 tablet (10 mg total) by mouth 2 (two) times daily. (Patient not  taking: Reported on 04/09/2024) 60 tablet 2   traMADol  (ULTRAM ) 50 MG tablet Take 1 tablet (50 mg total) by mouth every 12 (twelve) hours as needed. 30 tablet 0   Current Facility-Administered Medications  Medication Dose Route Frequency Provider Last Rate Last Admin   0.9 %  sodium chloride  infusion  500 mL Intravenous Once Kalanie Fewell C, MD       ARIPiprazole  ER (ABILIFY  MAINTENA) 400 MG prefilled syringe 400 mg  400 mg Intramuscular Q28 days    400 mg at 03/17/24 0842    Allergies as of 04/09/2024 - Review Complete 04/09/2024  Allergen Reaction Noted   Gabapentin Swelling 11/07/2011   Peanut-containing drug products Anaphylaxis  02/27/2024   Shellfish allergy Hives and Other (See Comments) 06/26/2012   Codeine Other (See Comments) 08/22/2010   Lamotrigine Other (See Comments) 08/22/2010   Topiramate Other (See Comments) 12/19/2010    Family History  Problem Relation Age of Onset   Breast cancer Mother    Prostate cancer Father    Heart attack Brother    Diabetes Maternal Aunt    Cancer Maternal Aunt    Parkinson's disease Maternal Grandmother    Breast cancer Paternal Grandmother    Dementia Paternal Grandmother    Cancer Paternal Grandfather    Breast cancer Cousin    Colon cancer Neg Hx    Rectal cancer Neg Hx    Stomach cancer Neg Hx     Social History   Socioeconomic History   Marital status: Married    Spouse name: Not on file   Number of children: Not on file   Years of education: Not on file   Highest education level: Not on file  Occupational History   Not on file  Tobacco Use   Smoking status: Former    Current packs/day: 0.00    Types: Cigarettes    Quit date: 06/09/2010    Years since quitting: 13.8   Smokeless tobacco: Never  Vaping Use   Vaping status: Never Used  Substance and Sexual Activity   Alcohol use: Not Currently    Comment: 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 2      Pets: No      Highest level or education completed: Law school      Current/Past profession: Librarian, academic of Domestic Violence Banker       Exercise: No                 Type and how often:          Living Will: Yes   DNR: Yes   POA/HPOA: Yes      Functional Status:   Do you have difficulty bathing or dressing yourself? No   Do you have difficulty preparing food or eating? No   Do you have difficulty managing your medications? No   Do you have difficulty managing your finances? No   Do you have difficulty affording your medications? No   Social Drivers of Corporate investment banker Strain: Medium Risk (11/06/2019)   [facility], [facility]   Overall Financial Resource Strain (CARDIA)    Difficulty of Paying Living Expenses: Somewhat hard  Food Insecurity: No Food Insecurity (11/06/2019)   Received from [facility], [facility]   Hunger Vital Sign    Worried About Running Out of Food in the Last Year: Never true    Ran Out of Food in the Last Year: Never true  Transportation Needs: No Transportation Needs (11/06/2019)   Received [facility], [facility]   [facility] - Transportation  Lack of Transportation (Medical): No    Lack of Transportation (Non-Medical): No  Physical Activity: Inactive (03/25/2019)   Received [facility], [facility]   Exercise Vital Sign    Days of Exercise per Week: 0 days    Minutes of Exercise per Session: 0 min  Stress: Stress Concern Present (03/25/2019)   Received from [facility],  [facility] of Occupational Health - Occupational Stress Questionnaire    Feeling of Stress : Very much  Social Connections: Unknown (06/18/2023)   Received from [facility], [facility]   Social Network    Social Network: Not on file  Intimate Partner Violence: Unknown (06/18/2023)   Received from [facility], [facility]   HITS    Physically Hurt: Not on file    Insult or Talk Down To: Not on file    Threaten Physical Harm: Not on file    Scream or Curse: Not on file    Physical Exam: Vital signs in last 24 hours: BP (!) 142/97   Pulse 91   Temp 98.1 F (36.7 C) (Temporal)   Ht 5\' 5"  (1.651 m)   Wt 250 lb (113.4 kg)   SpO2 97%   BMI 41.60 kg/m  GEN: NAD EYE: Sclerae anicteric ENT: MMM CV: Non-tachycardic Pulm: No increased work of breathing GI: Soft, NT/ND NEURO:  Alert & Oriented   Regino Caprio, MD [facility]  04/09/2024 1:39 PM

## 2024-04-09 NOTE — Op Note (Signed)
  [facility] Patient Name: [name] Procedure Date: 04/09/2024 1:43 PM MRN: [id number] Endoscopist: Pedro Bourgeois , , [id number] Age: 63 Referring MD:  Date of Birth: 29-Sep-1961 Gender: Female Account #: [id number] Procedure:                Colonoscopy Indications:              Screening for colorectal malignant neoplasm Medicines:                Monitored Anesthesia Care Procedure:                Pre-Anesthesia Assessment:                           - Prior to the procedure, a History and Physical                            was performed, and patient medications and                            allergies were reviewed. The patient's tolerance of                            previous anesthesia was also reviewed. The risks                            and benefits of the procedure and the sedation                            options and risks were discussed with the patient.                            All questions were answered, and informed consent                            was obtained. Prior Anticoagulants: The patient has                            taken no anticoagulant or antiplatelet agents. ASA                            Grade Assessment: III - A patient with severe                            systemic disease. After reviewing the risks and                            benefits, the patient was deemed in satisfactory                            condition to undergo the procedure.                           After obtaining informed consent, the colonoscope  was passed under direct vision. Throughout the                            procedure, the patient's blood pressure, pulse, and                            oxygen saturations were monitored continuously. The                            Olympus Scope SN: [id number] was introduced through                            the anus and advanced to the the terminal ileum.                             The colonoscopy was performed without difficulty.                            The patient tolerated the procedure well. The                            quality of the bowel preparation was good. The                            terminal ileum, ileocecal valve, appendiceal                            orifice, and rectum were photographed. Scope In: 1:57:50 PM Scope Out: 2:21:13 PM Scope Withdrawal Time: 0 hours 21 minutes 0 seconds  Total Procedure Duration: 0 hours 23 minutes 23 seconds  Findings:                 The terminal ileum appeared normal.                           Four sessile polyps were found in the transverse                            colon and cecum. The polyps were 3 to 6 mm in size.                            These polyps were removed with a cold snare.                            Resection and retrieval were complete.                           Multiple diverticula were found in the sigmoid                            colon, descending colon, transverse colon and                            ascending colon.  A 4 mm polyp was found in the descending colon. The                            polyp was sessile. The polyp was removed with a                            cold snare. Resection and retrieval were complete.                           Non-bleeding internal hemorrhoids were found during                            retroflexion. Complications:            No immediate complications. Estimated Blood Loss:     Estimated blood loss was minimal. Impression:               - The examined portion of the ileum was normal.                           - Four 3 to 6 mm polyps in the transverse colon and                            in the cecum, removed with a cold snare. Resected                            and retrieved.                           - Diverticulosis in the sigmoid colon, in the                            descending colon, in the transverse colon and in                             the ascending colon.                           - One 4 mm polyp in the descending colon, removed                            with a cold snare. Resected and retrieved.                           - Non-bleeding internal hemorrhoids. Recommendation:           - Discharge patient to home (with escort).                           - Await pathology results.                           - The findings and recommendations were discussed  with the patient. Dr Pedro Bourgeois [address]" [place],  04/09/2024 2:25:24 PM

## 2024-04-09 NOTE — Patient Instructions (Signed)
 Resume previous diet and medications. Awaiting pathology results. Repeat Colonoscopy date to be determined based on pathology results. Handouts provided on Colon polyps, Diverticulosis and Hemorrhoids   YOU HAD AN ENDOSCOPIC PROCEDURE TODAY AT THE [facility]:   Refer to the procedure report that was given to you for any specific questions about what was found during the examination.  If the procedure report does not answer your questions, please call your gastroenterologist to clarify.  If you requested that your care partner not be given the details of your procedure findings, then the procedure report has been included in a sealed envelope for you to review at your convenience later.  YOU SHOULD EXPECT: Some feelings of bloating in the abdomen. Passage of more gas than usual.  Walking can help get rid of the air that was put into your GI tract during the procedure and reduce the bloating. If you had a lower endoscopy (such as a colonoscopy or flexible sigmoidoscopy) you may notice spotting of blood in your stool or on the toilet paper. If you underwent a bowel prep for your procedure, you may not have a normal bowel movement for a few days.  Please Note:  You might notice some irritation and congestion in your nose or some drainage.  This is from the oxygen used during your procedure.  There is no need for concern and it should clear up in a day or so.  SYMPTOMS TO REPORT IMMEDIATELY:  Following lower endoscopy (colonoscopy or flexible sigmoidoscopy):  Excessive amounts of blood in the stool  Significant tenderness or worsening of abdominal pains  Swelling of the abdomen that is new, acute  Fever of 100F or higher  For urgent or emergent issues, a gastroenterologist can be reached at any hour by calling [phone]. Do not use MyChart messaging for urgent concerns.    DIET:  We do recommend a small meal at first, but then you may proceed to your regular diet.  Drink plenty of  fluids but you should avoid alcoholic beverages for 24 hours.  ACTIVITY:  You should plan to take it easy for the rest of today and you should NOT DRIVE or use heavy machinery until tomorrow (because of the sedation medicines used during the test).    FOLLOW UP: Our staff will call the number listed on your records the next business day following your procedure.  We will call around 7:15- 8:00 am to check on you and address any questions or concerns that you may have regarding the information given to you following your procedure. If we do not reach you, we will leave a message.     If any biopsies were taken you will be contacted by phone or by letter within the next 1-3 weeks.  Please call us at [phone] if you have not heard about the biopsies in 3 weeks.    SIGNATURES/CONFIDENTIALITY: You and/or your care partner have signed paperwork which will be entered into your electronic medical record.  These signatures attest to the fact that that the information above on your After Visit Summary has been reviewed and is understood.  Full responsibility of the confidentiality of this discharge information lies with you and/or your care-partner.

## 2024-04-09 NOTE — Progress Notes (Signed)
 Pt sedate, gd SR's, VSS, report to RN

## 2024-04-10 ENCOUNTER — Telehealth: Payer: Self-pay | Admitting: *Deleted

## 2024-04-10 NOTE — Telephone Encounter (Signed)
  Follow up Call-     04/09/2024   12:42 PM  Call back number  Post procedure Call Back phone  # [phone]  Permission to leave phone message Yes     Patient questions:  Do you have a fever, pain , or abdominal swelling? No. Pain Score  0 *  Have you tolerated food without any problems? Yes.    Have you been able to return to your normal activities? Yes.    Do you have any questions about your discharge instructions: Diet   No. Medications  No. Follow up visit  No.  Do you have questions or concerns about your Care? No.  Actions: * If pain score is 4 or above: No action needed, pain <4.

## 2024-04-14 LAB — SURGICAL PATHOLOGY

## 2024-04-16 ENCOUNTER — Ambulatory Visit (HOSPITAL_BASED_OUTPATIENT_CLINIC_OR_DEPARTMENT_OTHER)

## 2024-04-16 ENCOUNTER — Ambulatory Visit: Admitting: Hematology and Oncology

## 2024-04-16 VITALS — BP 160/98 | HR 93 | Ht 65.0 in | Wt 263.0 lb

## 2024-04-16 DIAGNOSIS — F319 Bipolar disorder, unspecified: Secondary | ICD-10-CM | POA: Diagnosis not present

## 2024-04-16 NOTE — Progress Notes (Signed)
 Patient arrives today for her due injection of Abilify  Maintena 400 mg. Patient is well groomed with a pleasant affect. Patient denies SI/HI or AVH. Injection was prepared as ordered and administered in patients Left Deltoid. Patient tolerated well and without complaint. She will return in 28 days for her next due injection     NDC: 59148-072-80 LOT: [id number] EXP: NOV 2026

## 2024-04-19 ENCOUNTER — Encounter: Payer: Self-pay | Admitting: Internal Medicine

## 2024-04-20 ENCOUNTER — Ambulatory Visit: Admitting: Hematology and Oncology

## 2024-04-20 ENCOUNTER — Telehealth: Payer: Self-pay | Admitting: Physical Therapy

## 2024-04-21 ENCOUNTER — Other Ambulatory Visit: Payer: Self-pay

## 2024-04-21 ENCOUNTER — Encounter: Payer: Self-pay | Admitting: Physical Therapy

## 2024-04-21 ENCOUNTER — Ambulatory Visit: Attending: Physician Assistant | Admitting: Physical Therapy

## 2024-04-21 DIAGNOSIS — M25551 Pain in right hip: Secondary | ICD-10-CM | POA: Insufficient documentation

## 2024-04-21 DIAGNOSIS — M79604 Pain in right leg: Secondary | ICD-10-CM | POA: Insufficient documentation

## 2024-04-21 DIAGNOSIS — M6281 Muscle weakness (generalized): Secondary | ICD-10-CM | POA: Insufficient documentation

## 2024-04-21 DIAGNOSIS — M7989 Other specified soft tissue disorders: Secondary | ICD-10-CM | POA: Insufficient documentation
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
## 2024-04-28 DIAGNOSIS — F3132 Bipolar disorder, current episode depressed, moderate: Secondary | ICD-10-CM | POA: Diagnosis not present

## 2024-04-28 DIAGNOSIS — F902 Attention-deficit hyperactivity disorder, combined type: Secondary | ICD-10-CM | POA: Diagnosis not present

## 2024-04-29 ENCOUNTER — Ambulatory Visit (INDEPENDENT_AMBULATORY_CARE_PROVIDER_SITE_OTHER): Admitting: Pulmonary Disease

## 2024-04-29 ENCOUNTER — Encounter: Payer: Self-pay | Admitting: Pulmonary Disease

## 2024-04-29 VITALS — BP 138/87 | HR 88 | Ht 65.0 in | Wt 266.0 lb

## 2024-04-29 DIAGNOSIS — G478 Other sleep disorders: Secondary | ICD-10-CM

## 2024-04-29 DIAGNOSIS — Z87891 Personal history of nicotine dependence: Secondary | ICD-10-CM | POA: Diagnosis not present

## 2024-04-29 DIAGNOSIS — R0683 Snoring: Secondary | ICD-10-CM | POA: Diagnosis not present

## 2024-04-29 DIAGNOSIS — R0602 Shortness of breath: Secondary | ICD-10-CM | POA: Diagnosis not present

## 2024-04-29 DIAGNOSIS — E66813 Obesity, class 3: Secondary | ICD-10-CM | POA: Diagnosis not present

## 2024-04-29 DIAGNOSIS — F902 Attention-deficit hyperactivity disorder, combined type: Secondary | ICD-10-CM | POA: Diagnosis not present

## 2024-04-29 DIAGNOSIS — Z6841 Body Mass Index (BMI) 40.0 and over, adult: Secondary | ICD-10-CM

## 2024-04-29 DIAGNOSIS — J438 Other emphysema: Secondary | ICD-10-CM

## 2024-04-29 DIAGNOSIS — F3132 Bipolar disorder, current episode depressed, moderate: Secondary | ICD-10-CM | POA: Diagnosis not present

## 2024-04-29 DIAGNOSIS — F909 Attention-deficit hyperactivity disorder, unspecified type: Secondary | ICD-10-CM | POA: Diagnosis not present

## 2024-04-29 NOTE — Progress Notes (Signed)
 [name] [facility]    [id number]    1961/10/21  Primary Care Physician:Ngetich, Elijio Guadeloupe, NP  Referring Physician: Estil Heman, NP [address] [place],  [place] [address]  Chief complaint:   Patient being seen for concern for sleep disordered breathing  HPI:  Patient with snoring, nonrestorative sleep Wakes up feeling unrested Denies soreness of the mouth or throat in the morning No morning headaches No night sweats Memory is good Both parents with sleep apnea Does admit to choking respirations at night , sleepiness during the day  Reformed smoker most smoked half a pack a day  Weight gain in the last 3 to 6 months up about 30 pounds  Shortness of breath with mild to moderate exertion sometimes just walking the dog gets short of breath  Usually goes to bed between 10 and 1030 Takes about an hour to fall asleep 2-4 awakenings Final wake up time about 7 AM  Quit smoking in 2011  She has a previous history of pulmonary embolism in 2001  Outpatient Encounter Medications as of 04/29/2024  Medication Sig   albuterol  (PROVENTIL  HFA;VENTOLIN  HFA) 108 (90 BASE) MCG/ACT inhaler Inhale 2 puffs into the lungs every 6 (six) hours as needed. sob    amLODipine  (NORVASC ) 5 MG tablet Take 1 tablet (5 mg total) by mouth daily.   ARIPiprazole  ER (ABILIFY  MAINTENA) 400 MG PRSY prefilled syringe Inject 400 mg into the muscle every 28 (twenty-eight) days.   aspirin  EC 81 MG tablet Take 1 tablet (81 mg total) by mouth daily. Swallow whole.   atorvastatin  (LIPITOR) 10 MG tablet Take 1 tablet (10 mg total) by mouth daily.   diclofenac  (VOLTAREN ) 75 MG EC tablet Take 1 tablet (75 mg total) by mouth 2 (two) times daily as needed.   lidocaine  (LIDODERM ) 5 % Place 1 patch onto the skin daily. Remove & Discard patch within 12 hours or as directed by MD   methocarbamol  (ROBAXIN -750) 750 MG tablet Take 1 tablet (750 mg total) by mouth 3 (three) times daily as needed for muscle  spasms.   ondansetron  (ZOFRAN -ODT) 8 MG disintegrating tablet Take 1 tablet (8 mg total) by mouth every 8 (eight) hours as needed for nausea or vomiting.   oxyCODONE -acetaminophen  (PERCOCET/ROXICET) 5-325 MG tablet Take 1 tablet by mouth every 6 (six) hours as needed for severe pain (pain score 7-10).   propranolol  (INDERAL ) 10 MG tablet Take 1 tablet (10 mg total) by mouth 2 (two) times daily.   spironolactone  (ALDACTONE ) 25 MG tablet Take 1 tablet (25 mg total) by mouth daily.   traMADol  (ULTRAM ) 50 MG tablet Take 1 tablet (50 mg total) by mouth every 12 (twelve) hours as needed.   No facility-administered encounter medications on file as of 04/29/2024.    Allergies as of 04/29/2024 - Review Complete 04/29/2024  Allergen Reaction Noted   Gabapentin Swelling 11/07/2011   Peanut-containing drug products Anaphylaxis 02/27/2024   Shellfish allergy Hives and Other (See Comments) 06/26/2012   Codeine Other (See Comments) 08/22/2010   Lamotrigine Other (See Comments) 08/22/2010   Topiramate Other (See Comments) 12/19/2010    Past Medical History:  Diagnosis Date   Anginal pain (HCC)    11/2011   Asthma    Bipolar 1 disorder (HCC)    Bipolar affective disorder, depressed, mild (HCC)    Remote history of suicidal attempts   Breast cancer (HCC)    In 2005 treated with surgery and Arimidex for 5  years   Depression    Headache(784.0)    h/o migraines    History of pulmonary embolism    Bilateral in 2001   HNP (herniated nucleus pulposus)    2000- L4-5, used pain mgt. prog. in [place]    Hypertension    Personal history of chemotherapy    Personal history of radiation therapy    Shortness of breath    in the past    Past Surgical History:  Procedure Laterality Date   ABDOMINAL HYSTERECTOMY     BREAST BIOPSY Left 12/2020   BREAST LUMPECTOMY Right    2005   MASTOPEXY  02/07/2012   Procedure: MASTOPEXY;  Surgeon: Marilou Showman, DO;  Location: [facility];  Service:  Plastics;  Laterality: Left;  left breast mastopexy/reduction for symmetry after right breast cancer   ORIF ANKLE FRACTURE  06/26/2012   Procedure: OPEN REDUCTION INTERNAL FIXATION (ORIF) ANKLE FRACTURE;  Surgeon: Jasmine Mesi, MD;  Location: [facility] OR;  Service: Orthopedics;  Laterality: Right;   REDUCTION MAMMAPLASTY Left    TUBAL LIGATION      Family History  Problem Relation Age of Onset   Breast cancer Mother 53   Prostate cancer Father 58   Heart attack Brother    Diabetes Maternal Aunt    Cancer Paternal Aunt    Parkinson's disease Maternal Grandmother    Breast cancer Paternal Grandmother 63 - 66   Dementia Paternal Grandmother    Breast cancer Cousin 20 - 37   Breast cancer Other 51 - 77       maternal great aunt   Colon cancer Neg Hx    Rectal cancer Neg Hx    Stomach cancer Neg Hx     Social History   Socioeconomic History   Marital status: Married    Spouse name: Not on file   Number of children: Not on file   Years of education: Not on file   Highest education level: Not on file  Occupational History   Not on file  Tobacco Use   Smoking status: Former    Current packs/day: 0.00    Types: Cigarettes    Quit date: 06/09/2010    Years since quitting: 13.8   Smokeless tobacco: Never  Vaping Use   Vaping status: Never Used  Substance and Sexual Activity   Alcohol use: Not Currently    Comment: 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 2      Pets: No      Highest level or education completed: Law school      Current/Past  profession: Librarian, academic of [employer]       Exercise: No                 Type and how often:          Living Will: Yes   DNR: Yes   POA/HPOA:  Yes      Functional Status:   Do you have difficulty bathing or dressing yourself? No   Do you have difficulty preparing food or eating? No   Do you have difficulty managing your medications? No   Do you have difficulty managing your finances? No   Do you have difficulty affording your medications? No   Social Drivers of Corporate investment banker Strain: Medium Risk (11/06/2019)   Received from [facility], [facility]   Overall Financial Resource Strain (CARDIA)    Difficulty of Paying Living Expenses: Somewhat hard  Food Insecurity: No Food Insecurity (11/06/2019)   Received from [facility], [facility]   Hunger Vital Sign    Worried About Running Out of Food in the Last Year: Never true    Ran Out of Food in the Last Year: Never true  Transportation Needs: No Transportation Needs (11/06/2019)   Received from [facility], [facility] - Transportation    Lack of Transportation (Medical): No    Lack of Transportation (Non-Medical): No  Physical Activity: Inactive (03/25/2019)   Received from [facility], [facility]   Exercise Vital Sign    Days of Exercise per Week: 0 days    Minutes of Exercise per Session: 0 min  Stress: Stress Concern Present (03/25/2019)   Received from [facility], [facility] of Occupational Health - Occupational Stress Questionnaire    Feeling of Stress : Very much  Social Connections: Unknown (06/18/2023)   Received from [facility], [facility]   Social Network    Social Network: Not on file  Intimate Partner Violence: Unknown (06/18/2023)   Received from [facility], [facility]   HITS    Physically Hurt: Not on file    Insult or Talk Down To: Not on file    Threaten Physical  Harm: Not on file    Scream or Curse: Not on file    Review of Systems  Constitutional:  Positive for fatigue.  Respiratory:  Positive for apnea and shortness of breath.   Cardiovascular:  Positive for leg swelling.  Psychiatric/Behavioral:  Positive for sleep disturbance.     Vitals:   04/29/24 1535  BP: 138/87  Pulse: 88  SpO2: 100%     Physical Exam Constitutional:      Appearance: She is obese.  HENT:     Head: Normocephalic.     Mouth/Throat:     Mouth: Mucous membranes are moist.  Eyes:     General: No scleral icterus. Cardiovascular:     Rate and Rhythm: Normal rate and regular rhythm.     Heart sounds: No murmur heard.    No friction rub.  Pulmonary:     Effort: No respiratory distress.     Breath sounds: No stridor. No wheezing or rhonchi.  Musculoskeletal:     Cervical back: No rigidity or tenderness.  Psychiatric:        Mood and Affect: Mood normal.    Data Reviewed: CT scan chest 05/29/2023 describes paraseptal emphysema, centrilobular emphysema  Assessment:  Shortness of breath Underlying obstructive lung disease - Past history of smoking - Does have a prescription for albuterol  - No PFT on record  Shortness of breath with mild to moderate exertion with leg swelling Progressive weight gain -Thyroid function and July 2024 within normal limits -Will benefit from an echocardiogram  Concern for sleep disordered breathing Concern for obesity hypoventilation - Will benefit from an in lab  split-night study  Pathophysiology of sleep disordered breathing discussed with the patient Treatment options discussed with the patient  Class III obesity - Encouraged to discuss GLP-1 agonist with primary doctor   Plan/Recommendations: Schedule patient for split-night study  Schedule for pulmonary function test  Schedule for echocardiogram  With weight gain, leg swelling, increased shortness of breath,-important to rule out significant cardiac  dysfunction, obesity, does have a history of hypertension which may predispose to diastolic dysfunction  Encourage graded activities as tolerated  Follow-up in 8 to 12 weeks  Encouraged to call with significant concerns   Myer Artis MD [facility] 04/29/2024, 3:39 PM  CC: Ngetich, Dinah C, NP

## 2024-04-29 NOTE — Patient Instructions (Signed)
 Schedule for echocardiogram -To further evaluate worsening shortness of breath, leg swelling  Schedule in-lab sleep study -Snoring, nonrestorative sleep, witnessed apneas  Schedule pulmonary function test -Emphysematous changes on your CT  Discuss weight loss drugs, GLP-1 agonist with your primary doctor -May help your weight loss journey  Call us  with significant concerns  Follow-up in about 8 to 12 weeks

## 2024-04-30 ENCOUNTER — Telehealth: Payer: Self-pay | Admitting: Genetic Counselor

## 2024-04-30 ENCOUNTER — Ambulatory Visit (INDEPENDENT_AMBULATORY_CARE_PROVIDER_SITE_OTHER): Admitting: Family

## 2024-04-30 ENCOUNTER — Ambulatory Visit: Payer: Self-pay | Admitting: *Deleted

## 2024-04-30 ENCOUNTER — Encounter: Payer: Self-pay | Admitting: Family

## 2024-04-30 ENCOUNTER — Encounter: Payer: Self-pay | Admitting: Genetic Counselor

## 2024-04-30 VITALS — BP 126/78 | HR 96 | Resp 18 | Ht 65.0 in | Wt 266.0 lb

## 2024-04-30 DIAGNOSIS — I10 Essential (primary) hypertension: Secondary | ICD-10-CM

## 2024-04-30 DIAGNOSIS — Z1379 Encounter for other screening for genetic and chromosomal anomalies: Secondary | ICD-10-CM | POA: Insufficient documentation

## 2024-04-30 MED ORDER — TIRZEPATIDE-WEIGHT MANAGEMENT 2.5 MG/0.5ML ~~LOC~~ SOLN: 2.5000 mg | SUBCUTANEOUS | 3 refills | Status: DC

## 2024-04-30 MED ORDER — AMLODIPINE BESYLATE 2.5 MG PO TABS: 2.5000 mg | ORAL_TABLET | Freq: Every day | ORAL | 1 refills | Status: DC

## 2024-04-30 NOTE — Patient Instructions (Signed)
 Reduce Amlodipine  from 5 mg tablet to 2.5 mg tablet one by moth daily due to ankle swelling . - check Blood pressure at home and record on log provided and notify provider if B/p > 140/90 /

## 2024-04-30 NOTE — Telephone Encounter (Signed)
 I contacted [name] to discuss her genetic testing results. No pathogenic variants were identified in the 77 genes analyzed. 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 Detailed clinic note to follow.  The test report has been scanned into EPIC and is located under the Molecular Pathology section of the Results Review tab.  A portion of the result report is included below for reference.   Treacy Holcomb, MS, [facility] Genetic Counselor [email or web address] (P) [phone]

## 2024-04-30 NOTE — Telephone Encounter (Signed)
 Copied from CRM [id number]. Topic: Clinical - Red Word Triage >> Apr 30, 2024 11:52 AM Adrianna P wrote: Red Word that prompted transfer to Nurse Triage: Swelling in both feet and legs Reason for Disposition  [1] MODERATE leg swelling (e.g., swelling extends up to knees) AND [2] new-onset or worsening  Answer Assessment - Initial Assessment Questions 1. ONSET: "When did the swelling start?" (e.g., minutes, hours, days)     I'm having swelling in both feet and lets for about 2 weeks now.    2. LOCATION: "What part of the leg is swollen?"  "Are both legs swollen or just one leg?"     Both legs and feet. This is new.   3. SEVERITY: "How bad is the swelling?" (e.g., localized; mild, moderate, severe)   - Localized: Small area of swelling localized to one leg.   - MILD pedal edema: Swelling limited to foot and ankle, pitting edema < 1/4 inch (6 mm) deep, rest and elevation eliminate most or all swelling.   - MODERATE edema: Swelling of lower leg to knee, pitting edema > 1/4 inch (6 mm) deep, rest and elevation only partially reduce swelling.   - SEVERE edema: Swelling extends above knee, facial or hand swelling present.      I can't see my ankle bones. 4. REDNESS: "Does the swelling look red or infected?"     There a little redness.   On the outside of my feet it hurts to touch on both feet.    5. PAIN: "Is the swelling painful to touch?" If Yes, ask: "How painful is it?"   (Scale 1-10; mild, moderate or severe)     I'm having pain on the outside of both feet.   It goes up my leg a little, not to my calf. 6. FEVER: "Do you have a fever?" If Yes, ask: "What is it, how was it measured, and when did it start?"      No 7. CAUSE: "What do you think is causing the leg swelling?"     No idea 8. MEDICAL HISTORY: "Do you have a history of blood clots (e.g., DVT), cancer, heart failure, kidney disease, or liver failure?"     I have a history of PE.   2001 9. RECURRENT SYMPTOM: "Have you had leg swelling  before?" If Yes, ask: "When was the last time?" "What happened that time?"     No but I have had blood clots in my lungs. 10. OTHER SYMPTOMS: "Do you have any other symptoms?" (e.g., chest pain, difficulty breathing)       No shortness of breath or chest pain.   I do have a little shortness of breath with exertion. 11. PREGNANCY: "Is there any chance you are pregnant?" "When was your last menstrual period?"       N/A due to age  Protocols used: Leg Swelling and Edema-A-AH  Chief Complaint: Both feet and ankles are swollen since May 8th, 2025. Symptoms: Pain on outer parts of both feet.   Tight feeling and shortness of breath with exertion Frequency: For last 2 weeks or so Pertinent Negatives: Patient denies chest pain Disposition: [] ED /[] Urgent Care (no appt availability in office) / [x] Appointment(In office/virtual)/ []  [facility]/ [] Home Care/ [] Refused Recommended Disposition /[]  Mobile Bus/ []  Follow-up with PCP Additional Notes: Appt made for this afternoon with Christean Courts, NP.

## 2024-04-30 NOTE — Telephone Encounter (Signed)
 Gibbs, Lucynda J, RN  [facility] Clinical35 minutes ago (12:04 PM)    FYI   Appt made for this afternoon with Dinah Ngetich, NP

## 2024-05-01 ENCOUNTER — Encounter: Payer: Self-pay | Admitting: Genetic Counselor

## 2024-05-01 ENCOUNTER — Ambulatory Visit: Payer: Self-pay | Admitting: Genetic Counselor

## 2024-05-01 DIAGNOSIS — Z1379 Encounter for other screening for genetic and chromosomal anomalies: Secondary | ICD-10-CM

## 2024-05-01 NOTE — Progress Notes (Signed)
 HPI:   [name] was previously seen in the [facility] due to a personal and family history of cancer and concerns regarding a hereditary predisposition to cancer. Please refer to our prior cancer genetics clinic note for more information regarding our discussion, assessment and recommendations, at the time. [name] recent genetic test results were disclosed to her, as were recommendations warranted by these results. These results and recommendations are discussed in more detail below.  CANCER HISTORY:  Oncology History   No history exists.    FAMILY HISTORY:  We obtained a detailed, 4-generation family history.  Significant diagnoses are listed below:      Family History  Problem Relation Age of Onset   Breast cancer Mother 79   Prostate cancer Father 84   Heart attack Brother     Diabetes Maternal Aunt     Cancer Paternal Aunt     Parkinson's disease Maternal Grandmother     Breast cancer Paternal Grandmother 76 - 64   Dementia Paternal Grandmother     Breast cancer Cousin 21 - 52   Breast cancer Other 70 - 86        maternal great aunt         [name] is unaware of previous family history of genetic testing for hereditary cancer risks. There is no reported Ashkenazi Jewish ancestry.    GENETIC TEST RESULTS:  The [facility] CancerNext-Expanded Panel found no pathogenic mutations.   The CancerNext-Expanded gene panel offered by [facility] and includes sequencing, rearrangement, and RNA analysis for the following 77 genes: AIP, ALK, APC, ATM, AXIN2, BAP1, BARD1, BMPR1A, BRCA1, BRCA2, BRIP1, CDC73, CDH1, CDK4, CDKN1B, CDKN2A, CEBPA, CHEK2, CTNNA1, DDX41, DICER1, ETV6, FH, FLCN, GATA2, LZTR1, MAX, MBD4, MEN1, MET, MLH1, MSH2, MSH3, MSH6, MUTYH, NF1, NF2, NTHL1, PALB2, PHOX2B, PMS2, POT1, PRKAR1A, PTCH1, PTEN, RAD51C, RAD51D, RB1, RET, RPS20, RUNX1, SDHA, SDHAF2, SDHB, SDHC, SDHD, SMAD4, SMARCA4, SMARCB1, SMARCE1, STK11, SUFU,  TMEM127, TP53, TSC1, TSC2, VHL, and WT1 (sequencing and deletion/duplication); EGFR, HOXB13, KIT, MITF, PDGFRA, POLD1, and POLE (sequencing only); EPCAM and GREM1 (deletion/duplication only).    The test report has been scanned into EPIC and is located under the Molecular Pathology section of the Results Review tab.  A portion of the result report is included below for reference. Genetic testing reported out on 04/27/2024.       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  At this time, it is unknown if this variant is associated with an increased risk for cancer or if it is benign, but most uncertain variants are reclassified to benign. It should not be used to make medical management decisions. With time, we suspect the laboratory will determine the significance of this variant, if any. If the laboratory reclassifies this variant, we will attempt to contact [name] to discuss it further.   Even though a pathogenic variant was not identified, possible explanations for the cancer in the family may include: There may be no hereditary risk for cancer in the family. The cancers in [name] and/or her family may be due to other genetic or environmental factors. There may be a gene mutation in one of these genes that current testing methods cannot detect, but that chance is small. There could be another gene that has not yet been discovered, or that we have not yet tested, that is responsible for the cancer diagnoses in the family.  It is also possible  there is a hereditary cause for the cancer in the family that [name] did not inherit.  Therefore, it is important to remain in touch with cancer genetics in the future so that we can continue to offer [name] the most up to date genetic testing.   ADDITIONAL GENETIC TESTING:  We discussed with [name] that her genetic testing was fairly  extensive.  If there are genes identified to increase cancer risk that can be analyzed in the future, we would be happy to discuss and coordinate this testing at that time.    CANCER SCREENING RECOMMENDATIONS:  [name] test result is considered negative (normal).  This means that we have not identified a hereditary cause for her personal and family history of cancer at this time.   An individual's cancer risk and medical management are not determined by genetic test results alone. Overall cancer risk assessment incorporates additional factors, including personal medical history, family history, and any available genetic information that may result in a personalized plan for cancer prevention and surveillance. Therefore, it is recommended she continue to follow the cancer management and screening guidelines provided by her oncology and primary healthcare provider.  RECOMMENDATIONS FOR FAMILY MEMBERS:   Since she did not inherit a mutation in a cancer predisposition gene included on this panel, her children could not have inherited a mutation from her in one of these genes. [name] daughters are recommended to have mammograms starting at age 9 (10 years prior to her age at diagnosis).  Other members of the family may still carry a pathogenic variant in one of these genes that [name] did not inherit. Based on the family history, we recommend her father and paternal cousin who has a history of breast cancer consider genetic counseling and testing.  We do not recommend familial testing for the APC variant of uncertain significance (VUS).  FOLLOW-UP:  Cancer genetics is a rapidly advancing field and it is possible that new genetic tests will be appropriate for her and/or her family members in the future. We encouraged her to remain in contact with cancer genetics on an annual basis so we can update her personal and family histories and let her know of advances in  cancer genetics that may benefit this family.   Our contact number was provided. [name] questions were answered to her satisfaction, and she knows she is welcome to call us  at anytime with additional questions or concerns.   Daiwik Buffalo, MS, [facility] Genetic Counselor [email or web address] (P) [phone]

## 2024-05-05 ENCOUNTER — Ambulatory Visit (HOSPITAL_COMMUNITY)
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
## 2024-05-07 ENCOUNTER — Ambulatory Visit (HOSPITAL_COMMUNITY)
Admission: RE | Admit: 2024-05-07 | Discharge: 2024-05-07 | Disposition: A | Discharge status: Home / Self Care | Source: Ambulatory Visit | Attending: Pulmonary Disease | Admitting: Pulmonary Disease

## 2024-05-07 DIAGNOSIS — E785 Hyperlipidemia, unspecified: Secondary | ICD-10-CM | POA: Diagnosis not present

## 2024-05-07 DIAGNOSIS — R0602 Shortness of breath: Secondary | ICD-10-CM | POA: Diagnosis not present

## 2024-05-07 DIAGNOSIS — I10 Essential (primary) hypertension: Secondary | ICD-10-CM | POA: Insufficient documentation

## 2024-05-07 LAB — ECHOCARDIOGRAM COMPLETE
Area-P 1/2: 3.99 cm2
S' Lateral: 2.3 cm

## 2024-05-07 NOTE — Progress Notes (Signed)
  Echocardiogram 2D Echocardiogram has been performed.  Farley Honer, RDCS 05/07/2024, 8:58 AM

## 2024-05-10 NOTE — Progress Notes (Signed)
 Provider: Christean Courts FNP-C  Nafis Farnan, Elijio Guadeloupe, NP  Patient Care Team: Emmajean Ratledge, Elijio Guadeloupe, NP as PCP - General (Family Medicine)  Extended Emergency Contact Information Primary Emergency Contact: McDavid,Kenneth  [place] Mobile Phone: [phone] Relation: Spouse Preferred language: English Interpreter needed? No Secondary Emergency Contact: Kelly,Dale  [place] Mobile Phone: [phone] Relation: Father Preferred language: English Interpreter needed? No  Code Status:  Full Code  Goals of care: Advanced Directive information    04/21/2024    5:34 PM  Advanced Directives  Does Patient Have a Medical Advance Directive? Yes  Type of Advance Directive Living will;Healthcare Power of Attorney  Does patient want to make changes to medical advance directive? No - Patient declined  Copy of Healthcare Power of Attorney in Chart? No - copy requested     Chief Complaint  Patient presents with   Acute Visit    Feet and ankles are swollen.    Discussed the use of AI scribe software for clinical note transcription with the patient, who gave verbal consent to proceed.  History of Present Illness   Charmon Thorson Chapman-McDavid "Dellar Fenton" is a 63 year old female with hypertension who presents with bilateral foot and ankle swelling and pain.  She has been experiencing bilateral foot and ankle swelling for the past three weeks, with pain particularly along the sides of her ankles, which are tender to touch. The swelling occurs daily and was first noticed after removing her shoes post-work. There have been no recent changes in diet or activity level.  She is currently taking spironolactone  25 mg daily and amlodipine  5 mg daily for blood pressure management. She previously took propranolol  but has since discontinued it. Her blood pressure has been stable at home.  She experiences shortness of breath when walking her dog or climbing stairs, which is a recent  development. No cough or palpitations are present. She also reports muscle weakness after sitting for extended periods, describing it as her muscles not wanting to work.  Her weight has increased by approximately 15 pounds over the past year, despite no changes in diet or activity. She recalls maintaining a stable weight in the 240s while living in [place]  for two years. Her current weight is 266 pounds, up from 263 pounds at the beginning of the month.  She has a history of allergies to gabapentin, peanuts, certain shellfish, codeine, Lamictal, and tropicamide. There is no known family history of thyroid issues or thyroid cancer.  She has been evaluated for sleep apnea and is awaiting a sleep study scheduled for August 1st. She reports significant snoring and episodes of gasping for breath during sleep, as noted by her husband.    Past Medical History:  Diagnosis Date   Anginal pain (HCC)    11/2011   Asthma    Bipolar 1 disorder (HCC)    Bipolar affective disorder, depressed, mild (HCC)    Remote history of suicidal attempts   Breast cancer (HCC)    In 2005 treated with surgery and Arimidex for 5 years   Depression    Headache(784.0)    h/o migraines    History of pulmonary embolism    Bilateral in 2001   HNP (herniated nucleus pulposus)    2000- L4-5, used pain mgt. prog. in [place]    Hypertension    Personal history of chemotherapy    Personal history of radiation therapy    Shortness of breath    in the past   Past  Surgical History:  Procedure Laterality Date   ABDOMINAL HYSTERECTOMY     BREAST BIOPSY Left 12/2020   BREAST LUMPECTOMY Right    2005   MASTOPEXY  02/07/2012   Procedure: MASTOPEXY;  Surgeon: Marilou Showman, DO;  Location: [facility];  Service: Plastics;  Laterality: Left;  left breast mastopexy/reduction for symmetry after right breast cancer   ORIF ANKLE FRACTURE  06/26/2012   Procedure: OPEN REDUCTION INTERNAL FIXATION (ORIF) ANKLE FRACTURE;   Surgeon: Jasmine Mesi, MD;  Location[facility] OR;  Service: Orthopedics;  Laterality: Right;   REDUCTION MAMMAPLASTY Left    TUBAL LIGATION      Allergies  Allergen Reactions   Gabapentin Swelling   Peanut-Containing Drug Products Anaphylaxis   Shellfish Allergy Hives and Other (See Comments)    Some shellfish- is able to eat shrimp but not mussels, clams, oysters- hives and lip swelling   Codeine Other (See Comments)    REACTION: lethargic   Lamotrigine Other (See Comments)    REACTION: lips swell   Topiramate Other (See Comments)    REACTION: hallucinations    Outpatient Encounter Medications as of 04/30/2024  Medication Sig   albuterol  (PROVENTIL  HFA;VENTOLIN  HFA) 108 (90 BASE) MCG/ACT inhaler Inhale 2 puffs into the lungs every 6 (six) hours as needed. sob    ARIPiprazole  ER (ABILIFY  MAINTENA) 400 MG PRSY prefilled syringe Inject 400 mg into the muscle every 28 (twenty-eight) days.   aspirin  EC 81 MG tablet Take 1 tablet (81 mg total) by mouth daily. Swallow whole.   atorvastatin  (LIPITOR) 10 MG tablet Take 1 tablet (10 mg total) by mouth daily.   diclofenac  (VOLTAREN ) 75 MG EC tablet Take 1 tablet (75 mg total) by mouth 2 (two) times daily as needed.   lidocaine  (LIDODERM ) 5 % Place 1 patch onto the skin daily. Remove & Discard patch within 12 hours or as directed by MD   methocarbamol  (ROBAXIN -750) 750 MG tablet Take 1 tablet (750 mg total) by mouth 3 (three) times daily as needed for muscle spasms.   ondansetron  (ZOFRAN -ODT) 8 MG disintegrating tablet Take 1 tablet (8 mg total) by mouth every 8 (eight) hours as needed for nausea or vomiting.   oxyCODONE -acetaminophen  (PERCOCET/ROXICET) 5-325 MG tablet Take 1 tablet by mouth every 6 (six) hours as needed for severe pain (pain score 7-10).   spironolactone  (ALDACTONE ) 25 MG tablet Take 1 tablet (25 mg total) by mouth daily.   tirzepatide  (ZEPBOUND ) 2.5 MG/0.5ML injection vial Inject 2.5 mg into the skin once a week.   traMADol   (ULTRAM ) 50 MG tablet Take 1 tablet (50 mg total) by mouth every 12 (twelve) hours as needed.   [DISCONTINUED] amLODipine  (NORVASC ) 5 MG tablet Take 1 tablet (5 mg total) by mouth daily.   amLODipine  (NORVASC ) 2.5 MG tablet Take 1 tablet (2.5 mg total) by mouth daily.   [DISCONTINUED] propranolol  (INDERAL ) 10 MG tablet Take 1 tablet (10 mg total) by mouth 2 (two) times daily. (Patient not taking: Reported on 04/30/2024)   No facility-administered encounter medications on file as of 04/30/2024.    Review of Systems  Constitutional:  Negative for appetite change, chills, fatigue, fever and unexpected weight change.  HENT:  Negative for congestion, dental problem, ear discharge, ear pain, facial swelling, hearing loss, nosebleeds, postnasal drip, rhinorrhea, sinus pressure, sinus pain, sneezing, sore throat, tinnitus and trouble swallowing.   Eyes:  Negative for pain, discharge, redness, itching and visual disturbance.  Respiratory:  Negative for cough, chest tightness, shortness of  breath and wheezing.        Shortness of breath with exertion   Cardiovascular:  Positive for leg swelling. Negative for chest pain and palpitations.  Gastrointestinal:  Negative for abdominal distention, abdominal pain, blood in stool, constipation, diarrhea, nausea and vomiting.  Genitourinary:  Negative for difficulty urinating, dysuria, flank pain, frequency and urgency.  Musculoskeletal:  Negative for arthralgias, back pain, gait problem, joint swelling and myalgias.  Skin:  Negative for color change, pallor, rash and wound.  Neurological:  Negative for dizziness, syncope, speech difficulty, weakness, light-headedness, numbness and headaches.    Immunization History  Administered Date(s) Administered   Influenza Split 11/14/2011, 08/27/2012   Influenza,inj,quad, With Preservative 02/18/2019   PFIZER(Purple Top)SARS-COV-2 Vaccination 03/15/2020, 04/05/2020, 01/02/2021   PNEUMOCOCCAL CONJUGATE-20 02/06/2024    Pertinent  Health Maintenance Due  Topic Date Due   INFLUENZA VACCINE  07/10/2024   MAMMOGRAM  03/27/2026   Colonoscopy  04/09/2029      10/25/2023    3:43 PM 02/06/2024    3:50 PM 03/04/2024    1:23 PM 04/29/2024    3:35 PM 04/30/2024    3:34 PM  Fall Risk  Falls in the past year? 0 1 1 1 1   Was there an injury with Fall? 0 0 0 0 0  Fall Risk Category Calculator 0 1 1 2 2   Patient at Risk for Falls Due to No Fall Risks No Fall Risks No Fall Risks  No Fall Risks  Fall risk Follow up  Falls evaluation completed Falls evaluation completed  Falls evaluation completed   Functional Status Survey:    Vitals:   04/30/24 1534  BP: 126/78  Pulse: 96  Resp: 18  SpO2: 95%  Weight: 266 lb (120.7 kg)  Height: 5\' 5"  (1.651 m)   Body mass index is 44.26 kg/m. Physical Exam  MEASUREMENTS: Weight- 266. GENERAL: Alert, cooperative, well developed, no acute distress HEENT: Normocephalic, normal oropharynx, moist mucous membranes CHEST: Clear to auscultation bilaterally, no wheezes, rhonchi, or crackles CARDIOVASCULAR: Normal heart rate and rhythm, S1 and S2 normal without murmurs ABDOMEN: Soft, non-tender, non-distended, without organomegaly, normal bowel sounds EXTREMITIES: Non-pitting edema, no cyanosis NEUROLOGICAL: Cranial nerves grossly intact, moves all extremities without gross motor or sensory deficit   Labs reviewed: Recent Labs    06/12/23 0905 11/16/23 1953 02/27/24 1022  NA 140 138 136  K 4.1 3.9 4.0  CL 105 105 101  CO2 24 21* 29  GLUCOSE 103* 102* 98  BUN 14 11 12   CREATININE 0.77 1.10* 0.83  CALCIUM  9.1 8.8* 8.5*   Recent Labs    06/12/23 0905 02/27/24 1022  AST 12 23  ALT 16 38  ALKPHOS  --  76  BILITOT 0.5 0.6  PROT 6.9 6.3*  ALBUMIN  --  3.1*   Recent Labs    06/12/23 0905 11/16/23 1953 02/27/24 1022  WBC 3.4* 7.1 5.2  NEUTROABS 1,448*  --  2.5  HGB 12.8 12.6 12.5  HCT 38.2 38.7 39.8  MCV 85.3 90.4 88.4  PLT 200 242 209   Lab Results   Component Value Date   TSH 1.21 06/12/2023   No results found for: "HGBA1C" Lab Results  Component Value Date   CHOL 282 (H) 06/12/2023   HDL 55 06/12/2023   LDLCALC 202 (H) 06/12/2023   TRIG 112 06/12/2023   CHOLHDL 5.1 (H) 06/12/2023    Significant Diagnostic Results in last 30 days:  ECHOCARDIOGRAM COMPLETE Result Date: 05/07/2024    ECHOCARDIOGRAM REPORT  Patient Name:   NIXIE LAUBE [facility] Date of Exam: 05/07/2024 Medical Rec #:  [id number]                Height:       65.0 in Accession #:    [id number]               Weight:       266.0 lb Date of Birth:  06-08-1961                 BSA:          2.233 m Patient Age:    63 years                 BP:           138/86 mmHg Patient Gender: F                        HR:           83 bpm. Exam Location:  Outpatient Procedure: 2D Echo, Cardiac Doppler, Color Doppler and Strain Analysis (Both            Spectral and Color Flow Doppler were utilized during procedure). Indications:    Dyspnea R06.00  History:        Patient has no prior history of Echocardiogram examinations.                 Risk Factors:Hypertension and Dyslipidemia.  Sonographer:    Kip Peon RDCS Referring Phys: [id number] ADEWALE [place]  Sonographer Comments: Global longitudinal strain was attempted. IMPRESSIONS  1. Left ventricular ejection fraction, by estimation, is 60 to 65%. The left ventricle has normal function. The left ventricle has no regional wall motion abnormalities. There is mild concentric left ventricular hypertrophy. Left ventricular diastolic parameters were normal. The global longitudinal strain is normal.  2. Right ventricular systolic function is normal. The right ventricular size is normal. There is normal pulmonary artery systolic pressure.  3. The mitral valve is grossly normal. No evidence of mitral valve regurgitation. No evidence of mitral stenosis.  4. The aortic valve was not well visualized. Aortic valve regurgitation is not visualized. No aortic  stenosis is present.  5. The inferior vena cava is normal in size with <50% respiratory variability, suggesting right atrial pressure of 8 mmHg. Comparison(s): No prior Echocardiogram. Conclusion(s)/Recommendation(s): Normal biventricular function without evidence of hemodynamically significant valvular heart disease. FINDINGS  Left Ventricle: Left ventricular ejection fraction, by estimation, is 60 to 65%. The left ventricle has normal function. The left ventricle has no regional wall motion abnormalities. Strain was performed and the global longitudinal strain is normal. The  global longitudinal strain is normal despite suboptimal segment tracking. The left ventricular internal cavity size was normal in size. There is mild concentric left ventricular hypertrophy. Left ventricular diastolic parameters were normal. Right Ventricle: The right ventricular size is normal. Right vetricular wall thickness was not well visualized. Right ventricular systolic function is normal. There is normal pulmonary artery systolic pressure. The tricuspid regurgitant velocity is 2.26 m/s, and with an assumed right atrial pressure of 8 mmHg, the estimated right ventricular systolic pressure is 28.4 mmHg. Left Atrium: Prominent coumadin  ridge. Left atrial size was normal in size. Right Atrium: Right atrial size was normal in size. Pericardium: Trivial pericardial effusion is present. Mitral Valve: The mitral valve is grossly normal. Mild to moderate mitral annular calcification. No evidence of mitral valve regurgitation. No evidence of  mitral valve stenosis. Tricuspid Valve: The tricuspid valve is grossly normal. Tricuspid valve regurgitation is trivial. No evidence of tricuspid stenosis. Aortic Valve: The aortic valve was not well visualized. Aortic valve regurgitation is not visualized. No aortic stenosis is present. Pulmonic Valve: The pulmonic valve was not well visualized. Pulmonic valve regurgitation is not visualized. No evidence  of pulmonic stenosis. Aorta: The aortic root is normal in size and structure. Venous: The inferior vena cava is normal in size with less than 50% respiratory variability, suggesting right atrial pressure of 8 mmHg. IAS/Shunts: The atrial septum is grossly normal.  LEFT VENTRICLE PLAX 2D LVIDd:         3.70 cm   Diastology LVIDs:         2.30 cm   LV e' medial:    6.53 cm/s LV PW:         1.20 cm   LV E/e' medial:  10.4 LV IVS:        1.10 cm   LV e' lateral:   7.40 cm/s LVOT diam:     2.10 cm   LV E/e' lateral: 9.2 LV SV:         67 LV SV Index:   30 LVOT Area:     3.46 cm  RIGHT VENTRICLE            IVC RV Basal diam:  3.60 cm    IVC diam: 1.70 cm RV S prime:     9.68 cm/s TAPSE (M-mode): 2.2 cm LEFT ATRIUM             Index        RIGHT ATRIUM           Index LA diam:        3.80 cm 1.70 cm/m   RA Area:     13.40 cm LA Vol (A2C):   36.9 ml 16.52 ml/m  RA Volume:   29.20 ml  13.08 ml/m LA Vol (A4C):   30.5 ml 13.66 ml/m LA Biplane Vol: 36.1 ml 16.17 ml/m  AORTIC VALVE LVOT Vmax:   81.20 cm/s LVOT Vmean:  60.300 cm/s LVOT VTI:    0.192 m  AORTA Ao Root diam: 2.90 cm MITRAL VALVE               TRICUSPID VALVE MV Area (PHT): 3.99 cm    TR Peak grad:   20.4 mmHg MV Decel Time: 190 msec    TR Vmax:        226.00 cm/s MV E velocity: 67.90 cm/s MV A velocity: 71.80 cm/s  SHUNTS MV E/A ratio:  0.95        Systemic VTI:  0.19 m                            Systemic Diam: 2.10 cm Sheryle Donning MD Electronically signed by Sheryle Donning MD Signature Date/Time: 05/07/2024/2:06:05 PM    Final     Assessment/Plan  Peripheral edema due to amlodipine  Bilateral non-pitting edema of feet and ankles for three weeks, painful to touch. Likely due to amlodipine , a known side effect. No dietary changes reported. Weight gain possibly related to fluid retention. Concerns about heart function; echocardiogram pending. Discontinuation of amlodipine  expected to resolve edema. - Discontinue amlodipine  due to  peripheral edema. - Prescribe alternative antihypertensive medication. - Reduce amlodipine  to 2.5 mg and monitor blood pressure. - Instruct to report if blood pressure exceeds 140/90 mmHg.  Shortness of breath Shortness of breath with minimal exertion, such as walking the dog or climbing stairs. No cough or palpitations. Concerns about heart function; echocardiogram pending. Possible sleep apnea; sleep study scheduled for August 1st. Heart rate regular on examination. - Await echocardiogram results to assess heart function. - Proceed with sleep study on August 1st to evaluate for sleep apnea.  Morbid Obesity BMI 44.26   Weight increased by 15 pounds over the past year without changes in diet or activity. Possible fluid retention due to amlodipine . Consideration of weight loss medication; insurance coverage to be verified. Discussed starting at a low dose and monitoring for side effects such as nausea, vomiting, and constipation. - Verify insurance coverage for weight loss medication. - Consider weight loss medication if covered by insurance. - Monitor for side effects of weight loss medication, including nausea, vomiting, and constipation.   Family/ staff Communication: Reviewed plan of care with patient verbalized understanding   Labs/tests ordered: None   Next Appointment: Return if symptoms worsen or fail to improve.  Total time: 30 minutes. Greater than 50% of total time spent doing patient education regarding edema ,dyspnea with exertion,Morbid Obesity,health maintenance including symptom/medication management.   Estil Heman, NP

## 2024-05-11 ENCOUNTER — Ambulatory Visit: Attending: Physician Assistant

## 2024-05-11 DIAGNOSIS — M7989 Other specified soft tissue disorders: Secondary | ICD-10-CM | POA: Insufficient documentation

## 2024-05-11 DIAGNOSIS — M6281 Muscle weakness (generalized): Secondary | ICD-10-CM | POA: Insufficient documentation

## 2024-05-11 DIAGNOSIS — M25551 Pain in right hip: Secondary | ICD-10-CM | POA: Diagnosis not present

## 2024-05-14 ENCOUNTER — Ambulatory Visit

## 2024-05-14 ENCOUNTER — Other Ambulatory Visit (HOSPITAL_COMMUNITY): Payer: Self-pay | Admitting: Student

## 2024-05-14 ENCOUNTER — Ambulatory Visit (HOSPITAL_BASED_OUTPATIENT_CLINIC_OR_DEPARTMENT_OTHER)

## 2024-05-14 VITALS — BP 153/89 | HR 96 | Ht 65.0 in | Wt 265.2 lb

## 2024-05-14 DIAGNOSIS — J438 Other emphysema: Secondary | ICD-10-CM | POA: Diagnosis not present

## 2024-05-14 DIAGNOSIS — F319 Bipolar disorder, unspecified: Secondary | ICD-10-CM

## 2024-05-14 LAB — PULMONARY FUNCTION TEST
DL/VA % pred: 109 %
DL/VA: 4.56 ml/min/mmHg/L
DLCO unc % pred: 80 %
DLCO unc: 16.73 ml/min/mmHg
FEF 25-75 Post: 1.79 L/s
FEF 25-75 Pre: 2.35 L/s
FEF2575-%Change-Post: -23 %
FEF2575-%Pred-Post: 75 %
FEF2575-%Pred-Pre: 99 %
FEV1-%Change-Post: -7 %
FEV1-%Pred-Post: 77 %
FEV1-%Pred-Pre: 83 %
FEV1-Post: 2.02 L
FEV1-Pre: 2.18 L
FEV1FVC-%Change-Post: -10 %
FEV1FVC-%Pred-Pre: 106 %
FEV6-%Change-Post: 2 %
FEV6-%Pred-Post: 81 %
FEV6-%Pred-Pre: 79 %
FEV6-Post: 2.68 L
FEV6-Pre: 2.62 L
FEV6FVC-%Pred-Post: 103 %
FEV6FVC-%Pred-Pre: 103 %
FVC-%Change-Post: 3 %
FVC-%Pred-Post: 80 %
FVC-%Pred-Pre: 77 %
FVC-Post: 2.73 L
FVC-Pre: 2.62 L
Post FEV1/FVC ratio: 74 %
Post FEV6/FVC ratio: 100 %
Pre FEV1/FVC ratio: 83 %
Pre FEV6/FVC Ratio: 100 %
RV % pred: 85 %
RV: 1.76 L
TLC % pred: 87 %
TLC: 4.53 L

## 2024-05-14 MED ORDER — ARIPIPRAZOLE ER 400 MG IM PRSY
400.0000 mg | PREFILLED_SYRINGE | INTRAMUSCULAR | Status: DC
  Administered 2024-05-14 – 2024-09-15 (×5): 400 mg via INTRAMUSCULAR

## 2024-05-14 NOTE — Progress Notes (Signed)
 Patient receiving Abilify  LAI though has not been seen by this provider in some time. Sent staff message to front desk to have them schedule the patient with Dr. Adine Ahmadi for f/u.

## 2024-05-14 NOTE — Progress Notes (Signed)
 Patient arrives today for her due injection of Abilify  Maintenna 400 mg. Patient presents well groomed with an appropriate affect. Patient has no SI/HI or AVH. Injection was prepared as ordered and administered in patients left Deltoid. Patient tolerated well and without complaint and will follow up in 28 days.    NDC: 09811-914-78 LOT: [id number] EXP: APR 2027

## 2024-05-14 NOTE — Patient Instructions (Signed)
 Full pft performed today.

## 2024-05-14 NOTE — Progress Notes (Signed)
 Full pft performed today.
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
## 2024-05-18 ENCOUNTER — Ambulatory Visit

## 2024-05-18 DIAGNOSIS — M7989 Other specified soft tissue disorders: Secondary | ICD-10-CM

## 2024-05-18 DIAGNOSIS — M6281 Muscle weakness (generalized): Secondary | ICD-10-CM | POA: Diagnosis not present

## 2024-05-18 DIAGNOSIS — M25551 Pain in right hip: Secondary | ICD-10-CM | POA: Diagnosis not present

## 2024-05-27 DIAGNOSIS — F902 Attention-deficit hyperactivity disorder, combined type: Secondary | ICD-10-CM | POA: Diagnosis not present

## 2024-05-27 DIAGNOSIS — F3132 Bipolar disorder, current episode depressed, moderate: Secondary | ICD-10-CM | POA: Diagnosis not present

## 2024-05-28 DIAGNOSIS — F902 Attention-deficit hyperactivity disorder, combined type: Secondary | ICD-10-CM | POA: Diagnosis not present

## 2024-05-28 DIAGNOSIS — F3132 Bipolar disorder, current episode depressed, moderate: Secondary | ICD-10-CM | POA: Diagnosis not present
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
## 2024-06-01 ENCOUNTER — Ambulatory Visit

## 2024-06-01 DIAGNOSIS — M6281 Muscle weakness (generalized): Secondary | ICD-10-CM | POA: Diagnosis not present

## 2024-06-01 DIAGNOSIS — M7989 Other specified soft tissue disorders: Secondary | ICD-10-CM | POA: Diagnosis not present

## 2024-06-01 DIAGNOSIS — M25551 Pain in right hip: Secondary | ICD-10-CM

## 2024-06-05 ENCOUNTER — Telehealth: Payer: Self-pay | Admitting: *Deleted

## 2024-06-05 NOTE — Telephone Encounter (Signed)
 Copied from CRM [id number]. Topic: Appointments - Scheduling Inquiry for Clinic >> Jun 03, 2024  1:41 PM Nathanel DEL wrote: Reason for CRM: pt would like to do an in home sleep study asap. (Instead of in house) Pt cancelled the August 01 sleep study appt that was in house. In addition pt is on wait list for sooner appt w/ Dr O canada ovrg For her August 20. >> Jun 03, 2024  5:22 PM Donzell ORN wrote: Home Sleep Test order will need to be placed if Dr. Neda would rather her have the HST other than in lab sleep study  Dr. Neda, patient cancelled split night sleep study and is not wanting a HST.  Please advise.

## 2024-06-08 ENCOUNTER — Ambulatory Visit

## 2024-06-10 ENCOUNTER — Ambulatory Visit (HOSPITAL_COMMUNITY)

## 2024-06-10 ENCOUNTER — Encounter (HOSPITAL_COMMUNITY): Payer: Self-pay

## 2024-06-10 VITALS — BP 132/84 | HR 78 | Ht 64.0 in | Wt 263.0 lb

## 2024-06-10 DIAGNOSIS — F319 Bipolar disorder, unspecified: Secondary | ICD-10-CM | POA: Diagnosis not present

## 2024-06-10 NOTE — Progress Notes (Signed)
 Patient arrived today for her due injection of Abilify  Maintenna 400mg . Patient presents well groomed with a pleasant affect having just come from work. Patient states she had a good day today. Patient denies any SI/HI or AVH. The injection was prepared as ordered and administered in patients Left Deltoid. Patient tolerated well and without complaint. She will return in 28 days for her next due injection    NDC: 40851-980-28 LOT: [id number] EXP: NOV 2026

## 2024-06-11 ENCOUNTER — Ambulatory Visit (HOSPITAL_COMMUNITY)

## 2024-06-11 NOTE — Progress Notes (Unsigned)
 BH MD Outpatient Progress Note  06/17/2024 9:42 AM [name]  MRN:  [id number]  Assessment:  [facility] presents for follow-up evaluation.    Identifying Information: [name] is a 63 y.o. y.o. female with a history of bipolar affective disorder and alcohol use disorder, in sustained remission who is an established patient with [facility] for management of bipolar affective disorder. The patient has been followed up in the clinic for chronic bipolar disorder and alcohol use disorder in sustained remission.  Patient has been doing well since a previous visits, her tremors have improved but she still has been experiencing them mostly in her hands has not been taking propranolol , and has a follow-up appointment with neurology today.  She denies SI/HI/AVH, reports good sleep and good appetite, denies any acute concerns.  Reported that being on Abilify  she has felt much better balanced denies any side effects apart from impulsive gambling, last spent 2 weeks ago, patient self-motivated to quit, and enrolled in an [facility] program and getting more involved with her work.  Patient had maintained sustained remission on bipolar disorder on Abilify , plan to continue the same monthly shots.  She did not schedule a sleep study visit yet, encouraged, patient amenable.  She is also undergoing mental health therapy, reports good therapeutic relationship.  Plan to follow up the patient in 3 months, continue monthly Abilify  injectable.  Plan:  # Bipolar affective disorder 1, history of numerous psychiatric hospitalizations, currently in partial remission Interventions: - Continue Abilify  Maintena 400 mg q. 28 days, Last on 7/2 next due on 8/4 - Consider Aristada, may potentially help better treat manic symptoms - Patient has been noncompliant with propranolol , does not want to take oral medications, plan to discontinue - Patient undergoing therapy as  outpatient   # Inattention, self-reported diagnosis of ADHD Interventions: - Patient possibly has OSA needs a sleep study, encouraged to schedule one soon.  Patient was given contact information for behavioral health clinic and was instructed to call 911 for emergencies.   Subjective:  Chief Complaint:  Chief Complaint  Patient presents with   Follow-up   bipolar 1 disorder    Interval History:  Today, the patient reported that she has been doing great .  She denies SI/HI/AVH, reports good sleep and good appetite, denies any acute concerns.  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  Reported that she did not go for sleep study as she wants her home sleep study, planning to schedule that soon.  Reported that she does not like taking oral medications, compliance with Abilify  injectables.  Reported that she has been feeling balance after being on Abilify , has been taking Abilify  since 2023, previously was on oral Abilify  medication.  Could not remember why it was discontinued and switched to lithium .  Reported that she has not had any side effects but has experienced impulsive gambling 2 weeks ago I spent a couple thousands of dollars, I knew I had to stop but could not quit .  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  She denies any episodes of depression, anxiety has improved from the previous visit.  Encourage continued compliance with Abilify .  Plan to  follow up in the clinic in 3 months.  Visit Diagnosis:    ICD-10-CM   1. Bipolar 1 disorder (HCC)  F31.9        Past Psychiatric History:  From initial evaluation: 63 y.o. female with a history of bipolar affective disorder 1 who presents in  person to [facility] for initial evaluation of medication management for bipolar disorder.  Patient reports being diagnosed with manic depressive illness at the age of 6, and having approximately 20 psychiatric hospitalizations until achieving sobriety from alcohol when she was approximately 63 years old.  Since becoming sober, the patient reports having no psychiatric hospitalizations until she decided, in conjunction with her psychiatrist, to taper off of Abilify .  She had a depressive relapse and was admitted to a psychiatric hospital near her home in [place] .  She was stabilized on Abilify  and given a long-acting injectable of the medication.  She states that she had been doing well since that time and recently moved to [place]  because of work obligations.  Unfortunately, she became nonadherent to the injection as a result of having no provider in [place] .   Past Medical History:  Past Medical History:  Diagnosis Date   Anginal pain (HCC)    11/2011   Asthma    Bipolar 1 disorder (HCC)    Bipolar affective disorder, depressed, mild (HCC)    Remote history of suicidal attempts   Breast cancer (HCC)    In 2005 treated with surgery and Arimidex for 5 years   Depression    Headache(784.0)    h/o migraines    History of pulmonary embolism    Bilateral in 2001   HNP (herniated nucleus pulposus)    2000- L4-5, used pain mgt. prog. in [place]    Hypertension    Personal history of chemotherapy    Personal history of radiation therapy    Shortness of breath    in the past    Past Surgical History:  Procedure Laterality Date   ABDOMINAL HYSTERECTOMY     BREAST BIOPSY Left 12/2020   BREAST LUMPECTOMY Right    2005   MASTOPEXY  02/07/2012   Procedure: MASTOPEXY;  Surgeon: Estefana Reichert, DO;  Location: [facility];  Service: Plastics;  Laterality: Left;  left breast mastopexy/reduction for symmetry after right breast cancer   ORIF ANKLE FRACTURE   06/26/2012   Procedure: OPEN REDUCTION INTERNAL FIXATION (ORIF) ANKLE FRACTURE;  Surgeon: Cordella Glendia Hutchinson, MD;  Location: [facility];  Service: Orthopedics;  Laterality: Right;   REDUCTION MAMMAPLASTY Left    TUBAL LIGATION      Family Psychiatric History: None pertinent  Family History:  Family History  Problem Relation Age of Onset   Breast cancer Mother 55   Prostate cancer Father 67   Heart attack Brother    Diabetes Maternal Aunt    Cancer Paternal Aunt    Parkinson's disease Maternal Grandmother    Breast cancer Paternal Grandmother 21 - 85   Dementia Paternal Grandmother    Breast cancer Cousin 24 - 42   Breast cancer Other 37 - 12       maternal great aunt   Colon cancer Neg Hx    Rectal cancer Neg Hx    Stomach cancer Neg Hx     Social History:  Social History   Socioeconomic History   Marital status: Married    Spouse name: Not on file   Number of children: Not on file  Years of education: Not on file   Highest education level: Not on file  Occupational History   Not on file  Tobacco Use   Smoking status: Former    Current packs/day: 0.00    Types: Cigarettes    Quit date: 06/09/2010    Years since quitting: 14.0   Smokeless tobacco: Never  Vaping Use   Vaping status: Never Used  Substance and Sexual Activity   Alcohol use: Not Currently    Comment: 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 2      Pets: No      Highest level or education completed: Law school      Current/Past profession: Librarian, academic of Domestic  Violence Banker       Exercise: No                 Type and how often:          Living Will: Yes   DNR: Yes   POA/HPOA: Yes      Functional Status:   Do you have difficulty bathing or dressing yourself? No   Do you have difficulty preparing food or eating? No   Do you have difficulty managing your medications? No   Do you have difficulty managing your finances? No   Do you have difficulty affording your medications? No   Social Drivers of Corporate investment banker Strain: Medium Risk (11/06/2019)   Received from [facility]   Overall Financial Resource Strain (CARDIA)    Difficulty of Paying Living Expenses: Somewhat hard  Food Insecurity: No Food Insecurity (11/06/2019)   [facility] from [facility]   Hunger Vital Sign    Within the past 12 months, you worried that your food would run out before you got the money to buy more.: Never true    Within the past 12 months, the food you bought just didn't last and you didn't have money to get more.: Never true  Transportation Needs: No Transportation Needs (11/06/2019)   Received from [facility] - Transportation    Lack of Transportation (Medical): No    Lack of Transportation (Non-Medical): No  Physical Activity: Inactive (03/25/2019)   Received from [facility]   Exercise Vital Sign    On average, how many days per week do you engage in moderate to strenuous exercise (like a brisk walk)?: 0 days    On average, how many minutes do you engage in exercise at this level?: 0 min  Stress: Stress Concern Present (03/25/2019)   Received from [facility] - Occupational Stress Questionnaire    Feeling of Stress : Very much  Social Connections: Unknown (06/18/2023)   Received from [facility]   Social Network    Social Network: Not on file    Allergies:  Allergies  Allergen Reactions   Gabapentin Swelling  Peanut-Containing Drug Products  Anaphylaxis   Shellfish Allergy Hives and Other (See Comments)    Some shellfish- is able to eat shrimp but not mussels, clams, oysters- hives and lip swelling   Codeine Other (See Comments)    REACTION: lethargic   Lamotrigine Other (See Comments)    REACTION: lips swell   Topiramate Other (See Comments)    REACTION: hallucinations    Current Medications: Current Outpatient Medications  Medication Sig Dispense Refill   albuterol  (PROVENTIL  HFA;VENTOLIN  HFA) 108 (90 BASE) MCG/ACT inhaler Inhale 2 puffs into the lungs every 6 (six) hours as needed. sob      amLODipine  (NORVASC ) 2.5 MG tablet Take 1 tablet (2.5 mg total) by mouth daily. 60 tablet 1   ARIPiprazole  ER (ABILIFY  MAINTENA) 400 MG PRSY prefilled syringe Inject 400 mg into the muscle every 28 (twenty-eight) days. 1 each 10   aspirin  EC 81 MG tablet Take 1 tablet (81 mg total) by mouth daily. Swallow whole.     atorvastatin  (LIPITOR) 10 MG tablet Take 1 tablet (10 mg total) by mouth daily. 90 tablet 3   diclofenac  (VOLTAREN ) 75 MG EC tablet Take 1 tablet (75 mg total) by mouth 2 (two) times daily as needed. 60 tablet 0   lidocaine  (LIDODERM ) 5 % Place 1 patch onto the skin daily. Remove & Discard patch within 12 hours or as directed by MD 15 patch 0   methocarbamol  (ROBAXIN -750) 750 MG tablet Take 1 tablet (750 mg total) by mouth 3 (three) times daily as needed for muscle spasms. 20 tablet 1   ondansetron  (ZOFRAN -ODT) 8 MG disintegrating tablet Take 1 tablet (8 mg total) by mouth every 8 (eight) hours as needed for nausea or vomiting. 10 tablet 0   oxyCODONE -acetaminophen  (PERCOCET/ROXICET) 5-325 MG tablet Take 1 tablet by mouth every 6 (six) hours as needed for severe pain (pain score 7-10). 15 tablet 0   spironolactone  (ALDACTONE ) 25 MG tablet Take 1 tablet (25 mg total) by mouth daily. 90 tablet 1   tirzepatide  (ZEPBOUND ) 2.5 MG/0.5ML injection vial Inject 2.5 mg into the skin once a week. 0.5 mL 3   traMADol  (ULTRAM ) 50 MG tablet  Take 1 tablet (50 mg total) by mouth every 12 (twelve) hours as needed. 30 tablet 0   Current Facility-Administered Medications  Medication Dose Route Frequency Provider Last Rate Last Admin   ARIPiprazole  ER (ABILIFY  MAINTENA) 400 MG prefilled syringe 400 mg  400 mg Intramuscular Q28 days Marry Clamp, MD   400 mg at 06/10/24 1606     Objective:  Psychiatric Specialty Exam: Physical Exam Constitutional:      Appearance: the patient is not toxic-appearing.  Pulmonary:     Effort: Pulmonary effort is normal.  Neurological:     General: No focal deficit present.     Mental Status: the patient is alert and oriented to person, place, and time.   Review of Systems  Respiratory:  Negative for shortness of breath.   Cardiovascular:  Negative for chest pain.  Gastrointestinal:  Negative for abdominal pain, constipation, diarrhea, nausea and vomiting.  Neurological:  Negative for headaches.      BP (!) 149/89   Pulse 92   Ht 5' 4 (1.626 m)   Wt 265 lb 9.6 oz (120.5 kg)   BMI 45.59 kg/m   General Appearance: Fairly Groomed  Eye Contact:  Good  Speech:  Clear and Coherent  Volume:  Normal  Mood:  Euthymic  Affect:  Congruent  Thought Process:  Coherent  Orientation:  Full (Time, Place, and Person)  Thought Content: Logical   Suicidal Thoughts:  No  Homicidal Thoughts:  No  Memory:  Immediate;   Good  Judgement:  fair  Insight:  fair  Psychomotor Activity:  Normal  Concentration:  Concentration: Good  Recall:  Good  Fund of Knowledge: Good  Language: Good  Akathisia:  No  Handed:    AIMS (if indicated): not done  Assets:  Communication Skills Desire for Improvement Financial Resources/Insurance Housing Leisure Time Physical Health  ADL's:  Intact  Cognition: WNL  Sleep:  Fair     Metabolic Disorder Labs: No results found for: HGBA1C, MPG No results found for: PROLACTIN Lab Results  Component Value Date   CHOL 282 (H) 06/12/2023   TRIG 112  06/12/2023   HDL 55 06/12/2023   CHOLHDL 5.1 (H) 06/12/2023   VLDL 26 11/14/2011   LDLCALC 202 (H) 06/12/2023   LDLCALC 82 11/14/2011   Lab Results  Component Value Date   TSH 1.21 06/12/2023   TSH 2.697 11/14/2011    Therapeutic Level Labs: Lab Results  Component Value Date   LITHIUM  0.2 (L) 09/17/2023   LITHIUM  0.27 (L) 11/13/2011   No results found for: VALPROATE No results found for: CBMZ  Screenings: GAD-7    Flowsheet Row Office Visit from 03/04/2024 in [facility]  Total GAD-7 Score 0   PHQ2-9    Flowsheet Row Office Visit from 06/17/2024 in [facility] Office Visit from 04/30/2024 in [facility] Office Visit from 03/04/2024 in [facility] Office Visit from 02/06/2024 in [facility] Office Visit from 10/25/2023 in [facility]  PHQ-2 Total Score 0 0 0 0 0  PHQ-9 Total Score -- 0 0 -- --   Flowsheet Row ED from 02/27/2024 in [facility] Emergency Department at [facility] ED from 11/16/2023 in [facility] Emergency Department at [facility]  C-SSRS RISK CATEGORY No Risk No Risk    Collaboration of Care: none  A total of 30 minutes was spent involved in face to face clinical care, chart review, documentation.   Avik Leoni, MD 06/17/2024, 9:42 AM

## 2024-06-15 ENCOUNTER — Ambulatory Visit

## 2024-06-16 NOTE — Progress Notes (Addendum)
 Location:  [facility]  POS: Clinic  Provider: Tawni America, ANP   Goals of Care:     04/21/2024    5:34 PM  Advanced Directives  Does Patient Have a Medical Advance Directive? Yes  Type of Advance Directive Living will;Healthcare Power of Attorney  Does patient want to make changes to medical advance directive? No - Patient declined  Copy of Healthcare Power of Attorney in Chart? No - copy requested     Chief Complaint  Patient presents with   Follow-up    1 month follow up. Wants to discuss zepbound     HPI:   History of Present Illness   The patient presents for follow-up of hypertension and weight management.  Blood pressure is currently stable at 138/80 mmHg after a reduction in amlodipine  (Norvasc ) dose due to swelling, which has decreased since the dose adjustment.  She used a sample of zebbound for four weeks, resulting in a weight decrease to 267 pounds. However, after discontinuing the medication for two to three weeks due to insurance issues, her weight increased back to the initial range of 266-267 pounds. No side effects such as nausea or constipation were experienced while on zebound   She has a history of high cholesterol, with an LDL level of 202 mg/dL noted last year. Currently taking atorvastatin  (Lipitor) 10 mg daily.  She has a history of COPD and emphysema. A recent pulmonary function test was performed, but she has not received an interpretation of the results. She experiences shortness of breath, particularly when climbing stairs, which she attributes to asthma.  She has not yet completed a sleep study, initially scheduled for August 1st but postponed for an at-home study. She is awaiting further instructions from the pulmonary office to proceed with this test.       Normal echo 05/07/24 done for sob  Sleep study pending for [facility] PFTs done and interpreted as normal.   Wt Readings from Last 3 Encounters:  06/17/24 267 lb (121.1 kg)  04/30/24 266 lb  (120.7 kg)  04/29/24 266 lb (120.7 kg)    Past Medical History:  Diagnosis Date   Anginal pain (HCC)    11/2011   Asthma    Bipolar 1 disorder (HCC)    Bipolar affective disorder, depressed, mild (HCC)    Remote history of suicidal attempts   Breast cancer (HCC)    In 2005 treated with surgery and Arimidex for 5 years   Depression    Headache(784.0)    h/o migraines    History of pulmonary embolism    Bilateral in 2001   HNP (herniated nucleus pulposus)    2000- L4-5, used pain mgt. prog. in [place]    Hypertension    Personal history of chemotherapy    Personal history of radiation therapy    Shortness of breath    in the past    Past Surgical History:  Procedure Laterality Date   ABDOMINAL HYSTERECTOMY     BREAST BIOPSY Left 12/2020   BREAST LUMPECTOMY Right    2005   MASTOPEXY  02/07/2012   Procedure: MASTOPEXY;  Surgeon: Estefana Reichert, DO;  Location: [facility];  Service: Plastics;  Laterality: Left;  left breast mastopexy/reduction for symmetry after right breast cancer   ORIF ANKLE FRACTURE  06/26/2012   Procedure: OPEN REDUCTION INTERNAL FIXATION (ORIF) ANKLE FRACTURE;  Surgeon: Cordella Glendia Hutchinson, MD;  Location: [facility] OR;  Service: Orthopedics;  Laterality: Right;   REDUCTION MAMMAPLASTY Left    TUBAL  LIGATION      Allergies  Allergen Reactions   Gabapentin Swelling   Peanut-Containing Drug Products Anaphylaxis   Shellfish Allergy Hives and Other (See Comments)    Some shellfish- is able to eat shrimp but not mussels, clams, oysters- hives and lip swelling   Codeine Other (See Comments)    REACTION: lethargic   Lamotrigine Other (See Comments)    REACTION: lips swell   Topiramate Other (See Comments)    REACTION: hallucinations    Outpatient Encounter Medications as of 06/17/2024  Medication Sig   albuterol  (PROVENTIL  HFA;VENTOLIN  HFA) 108 (90 BASE) MCG/ACT inhaler Inhale 2 puffs into the lungs every 6 (six) hours as needed. sob    amLODipine   (NORVASC ) 2.5 MG tablet Take 1 tablet (2.5 mg total) by mouth daily.   ARIPiprazole  ER (ABILIFY  MAINTENA) 400 MG PRSY prefilled syringe Inject 400 mg into the muscle every 28 (twenty-eight) days.   aspirin  EC 81 MG tablet Take 1 tablet (81 mg total) by mouth daily. Swallow whole.   atorvastatin  (LIPITOR) 10 MG tablet Take 1 tablet (10 mg total) by mouth daily.   methocarbamol  (ROBAXIN -750) 750 MG tablet Take 1 tablet (750 mg total) by mouth 3 (three) times daily as needed for muscle spasms.   ondansetron  (ZOFRAN -ODT) 8 MG disintegrating tablet Take 1 tablet (8 mg total) by mouth every 8 (eight) hours as needed for nausea or vomiting.   oxyCODONE -acetaminophen  (PERCOCET/ROXICET) 5-325 MG tablet Take 1 tablet by mouth every 6 (six) hours as needed for severe pain (pain score 7-10).   Semaglutide ,0.25 or 0.5MG /DOS, (OZEMPIC , 0.25 OR 0.5 MG/DOSE,) 2 MG/3ML SOPN Inject 0.25 mg into the skin once a week.   spironolactone  (ALDACTONE ) 25 MG tablet Take 1 tablet (25 mg total) by mouth daily.   traMADol  (ULTRAM ) 50 MG tablet Take 1 tablet (50 mg total) by mouth every 12 (twelve) hours as needed.   [DISCONTINUED] tirzepatide  (ZEPBOUND ) 2.5 MG/0.5ML injection vial Inject 2.5 mg into the skin once a week.   diclofenac  (VOLTAREN ) 75 MG EC tablet Take 1 tablet (75 mg total) by mouth 2 (two) times daily as needed. (Patient not taking: Reported on 06/17/2024)   lidocaine  (LIDODERM ) 5 % Place 1 patch onto the skin daily. Remove & Discard patch within 12 hours or as directed by MD (Patient not taking: Reported on 06/17/2024)   Facility-Administered Encounter Medications as of 06/17/2024  Medication   ARIPiprazole  ER (ABILIFY  MAINTENA) 400 MG prefilled syringe 400 mg    Review of Systems:  Review of Systems  Constitutional:  Negative for activity change, appetite change, chills, diaphoresis, fatigue, fever and unexpected weight change.  HENT:  Negative for congestion.   Respiratory:  Positive for shortness of breath  (on exertion). Negative for cough and wheezing.   Cardiovascular:  Negative for chest pain, palpitations and leg swelling.  Gastrointestinal:  Negative for abdominal distention, abdominal pain, constipation and diarrhea.  Genitourinary:  Negative for difficulty urinating and dysuria.  Musculoskeletal:  Negative for arthralgias, back pain, gait problem, joint swelling and myalgias.  Neurological:  Negative for dizziness, tremors, seizures, syncope, facial asymmetry, speech difficulty, weakness, light-headedness, numbness and headaches.  Psychiatric/Behavioral:  Negative for agitation, behavioral problems and confusion.     Health Maintenance  Topic Date Due   DTaP/Tdap/Td (1 - Tdap) Never done   Zoster Vaccines- Shingrix (1 of 2) Never done   Cervical Cancer Screening (HPV/Pap Cotest)  Never done   COVID-19 Vaccine (4 - 2024-25 season) 07/03/2024 (Originally 08/11/2023)   INFLUENZA  VACCINE  07/10/2024   Lung Cancer Screening  11/16/2024   MAMMOGRAM  03/27/2026   Colonoscopy  04/09/2029   Pneumococcal Vaccine 36-47 Years old  Completed   Hepatitis C Screening  Completed   HIV Screening  Completed   Hepatitis B Vaccines  Aged Out   HPV VACCINES  Aged Out   Meningococcal B Vaccine  Aged Out    Physical Exam: Vitals:   06/17/24 1106  BP: 138/80  Pulse: 86  Temp: 98 F (36.7 C)  SpO2: 98%  Weight: 267 lb (121.1 kg)  Height: 5' 4 (1.626 m)   Body mass index is 45.83 kg/m. Physical Exam Constitutional:      General: She is not in acute distress.    Appearance: She is not diaphoretic.  HENT:     Head: Normocephalic and atraumatic.  Neck:     Vascular: No carotid bruit or JVD.  Cardiovascular:     Rate and Rhythm: Normal rate and regular rhythm.     Heart sounds: No murmur heard. Pulmonary:     Effort: Pulmonary effort is normal. No respiratory distress.     Breath sounds: Normal breath sounds. No wheezing.  Musculoskeletal:     Cervical back: No tenderness.     Comments:  Trace ankle edema  Lymphadenopathy:     Cervical: No cervical adenopathy.  Skin:    General: Skin is warm and dry.  Neurological:     Mental Status: She is alert and oriented to person, place, and time.     Labs reviewed: Basic Metabolic Panel: Recent Labs    11/16/23 1953 02/27/24 1022  NA 138 136  K 3.9 4.0  CL 105 101  CO2 21* 29  GLUCOSE 102* 98  BUN 11 12  CREATININE 1.10* 0.83  CALCIUM  8.8* 8.5*   Liver Function Tests: Recent Labs    02/27/24 1022  AST 23  ALT 38  ALKPHOS 76  BILITOT 0.6  PROT 6.3*  ALBUMIN 3.1*   No results for input(s): LIPASE, AMYLASE in the last 8760 hours. No results for input(s): AMMONIA in the last 8760 hours. CBC: Recent Labs    11/16/23 1953 02/27/24 1022  WBC 7.1 5.2  NEUTROABS  --  2.5  HGB 12.6 12.5  HCT 38.7 39.8  MCV 90.4 88.4  PLT 242 209   Lipid Panel: No results for input(s): CHOL, HDL, LDLCALC, TRIG, CHOLHDL, LDLDIRECT in the last 8760 hours. No results found for: HGBA1C  Procedures since last visit: No results found.  Assessment and Plan    Obesity Weight returned to baseline after zebound discontinuation due to cost - Provide one-month supply of Ozempic . - Instruct on starting dose of 0.25 mg weekly, increase to 0.5 mg if tolerated. - Schedule follow-up in one month to assess response and side effects. - Attempt to obtain insurance coverage for Ozempic .  Hypertension Blood pressure controlled at 138/80 mmHg on current amlodipine  dose without significant edema. - Continue current dose of amlodipine .  Hyperlipidemia LDL cholesterol elevated at 202 mg/dL despite atorvastatin  10 mg daily. Abilify  may contribute to weight gain and elevated cholesterol. - Recheck lipid panel when fasting. - Encourage dietary modifications to reduce fat and sugar intake. - Increase physical activity.  COPD Normal pulmonary function tests. Previous COPD diagnosis not supported by current PFTs.  -  Follow up with pulmonologist for further evaluation.   Bipolar  Mood is well controlled on Abilify  which can cause dyslipidemia Discussed diet and exercise management.   General Health  Maintenance Renal function and blood sugar normal at last check. Fasting labs advised for accuracy. - Schedule fasting labs for renal function, blood sugar, and lipid panel. - Encourage regular follow-up for health maintenance.          Labs/tests ordered:  * No order type specified *TSH Lipid BMP Next appt:  1 month    Total time :  time greater than 50% of total time spent doing pt counseling and coordination of care

## 2024-06-17 ENCOUNTER — Encounter: Payer: Self-pay | Admitting: Adult Health

## 2024-06-17 ENCOUNTER — Ambulatory Visit (INDEPENDENT_AMBULATORY_CARE_PROVIDER_SITE_OTHER): Admitting: Adult Health

## 2024-06-17 ENCOUNTER — Ambulatory Visit (HOSPITAL_BASED_OUTPATIENT_CLINIC_OR_DEPARTMENT_OTHER)

## 2024-06-17 ENCOUNTER — Ambulatory Visit (INDEPENDENT_AMBULATORY_CARE_PROVIDER_SITE_OTHER): Payer: Self-pay | Admitting: Neurology

## 2024-06-17 ENCOUNTER — Encounter: Payer: Self-pay | Admitting: Neurology

## 2024-06-17 VITALS — BP 149/89 | HR 92 | Ht 64.0 in | Wt 265.6 lb

## 2024-06-17 VITALS — BP 136/81 | HR 84 | Ht 64.0 in | Wt 265.0 lb

## 2024-06-17 VITALS — BP 138/80 | HR 86 | Temp 98.0°F | Ht 64.0 in | Wt 267.0 lb

## 2024-06-17 DIAGNOSIS — E785 Hyperlipidemia, unspecified: Secondary | ICD-10-CM

## 2024-06-17 DIAGNOSIS — R251 Tremor, unspecified: Secondary | ICD-10-CM | POA: Diagnosis not present

## 2024-06-17 DIAGNOSIS — I10 Essential (primary) hypertension: Secondary | ICD-10-CM

## 2024-06-17 DIAGNOSIS — F3132 Bipolar disorder, current episode depressed, moderate: Secondary | ICD-10-CM | POA: Diagnosis not present

## 2024-06-17 DIAGNOSIS — I700 Atherosclerosis of aorta: Secondary | ICD-10-CM

## 2024-06-17 DIAGNOSIS — F902 Attention-deficit hyperactivity disorder, combined type: Secondary | ICD-10-CM | POA: Diagnosis not present

## 2024-06-17 DIAGNOSIS — F319 Bipolar disorder, unspecified: Secondary | ICD-10-CM | POA: Diagnosis not present

## 2024-06-17 DIAGNOSIS — J432 Centrilobular emphysema: Secondary | ICD-10-CM

## 2024-06-17 MED ORDER — OZEMPIC (0.25 OR 0.5 MG/DOSE) 2 MG/3ML ~~LOC~~ SOPN: 0.2500 mg | PEN_INJECTOR | SUBCUTANEOUS | 1 refills | Status: AC

## 2024-06-17 NOTE — Progress Notes (Signed)
 Subjective:    Patient ID: [name] is a 63 y.o. female.  HPI    True Mar, MD, PhD [facility] [address], [address] [address] [place], [place] [address]  Dear Roxan,  I saw your patient, [name], upon your kind request in my neurologic clinic today for initial consultation of her hand tremors.  The patient is unaccompanied today.  As you know, [name] is a 63 year old female with an underlying complex medical history of asthma, emphysema, breast cancer, headaches, history of pulmonary embolism, lumbar herniated disc, hypertension, mood disorder including bipolar disorder, and severe obesity with a BMI of over 45, who reports an intermittent hand tremor since January 2025.  She reports that she was started on Abilify  injections in December 2024 and started noticing the tremor in January.  In March 2025 she was switched from Abilify  to lithium  which made the tremor much worse and lithium  was subsequently stopped in May 2025 and she was put back on Abilify  injections. She has not noticed much in the way of trigger but tremor is worse when she tries to hold something with the hand such as a cup or a microphone or the phone.  She does not sleep well.  She has not had her sleep study through pulmonology yet but is willing to call their office.  She has been on propranolol  for blood pressure management since 2023.  She did not have a tremor at the time but since it did not really affect her tremor, she was switched from propranolol  to amlodipine  in May 2025.  Taking the propranolol  away did not make a difference for her tremor.  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  She quit smoking about 14 years ago.  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  She takes oxycodone  as needed for radiating back pain and Robaxin  as needed only.  She drinks caffeine in the form of coffee, 1 or 2  cups/day and soda, 1 can about 2-3 times a week.  She does not hydrate very much with water, she estimates that she drinks about 16 ounces of water per day.  She drinks 1 cup of juice per day on average. I reviewed your office note from 02/06/2024.  She reports a family history of Parkinson's disease in her maternal grandmother.  She has previously tried propranolol  but it was not effective.  Of note, she is currently on multiple medications including Abilify , Robaxin , Percocet as needed, tramadol  as needed, full list of medication below.   She is followed by pulmonology and is supposed to have a sleep study soon.  She is supposed to have blood work including TSH.  She is also followed by behavioral health.  Her Past Medical History Is Significant For: Past Medical History:  Diagnosis Date   Anginal pain (HCC)    11/2011   Asthma    Bipolar 1 disorder (HCC)    Bipolar affective disorder, depressed, mild (HCC)    Remote history of suicidal attempts   Breast cancer (HCC)    In 2005 treated with surgery and Arimidex for 5 years   Depression    Headache(784.0)    h/o migraines    History of pulmonary embolism    Bilateral in 2001   HNP (herniated nucleus pulposus)    2000- L4-5, used pain mgt. prog. in [place]    Hypertension    Personal history of chemotherapy  Personal history of radiation therapy    Shortness of breath    in the past    Her Past Surgical History Is Significant For: Past Surgical History:  Procedure Laterality Date   ABDOMINAL HYSTERECTOMY     BREAST BIOPSY Left 12/2020   BREAST LUMPECTOMY Right    2005   MASTOPEXY  02/07/2012   Procedure: MASTOPEXY;  Surgeon: Estefana Reichert, DO;  Location: [facility];  Service: Plastics;  Laterality: Left;  left breast mastopexy/reduction for symmetry after right breast cancer   ORIF ANKLE FRACTURE  06/26/2012   Procedure: OPEN REDUCTION INTERNAL FIXATION (ORIF) ANKLE FRACTURE;  Surgeon: Cordella Glendia Hutchinson, MD;   Location: [facility] OR;  Service: Orthopedics;  Laterality: Right;   REDUCTION MAMMAPLASTY Left    TUBAL LIGATION      Her Family History Is Significant For: Family History  Problem Relation Age of Onset   Breast cancer Mother 79   Prostate cancer Father 28   Heart attack Brother    Diabetes Maternal Aunt    Cancer Paternal Aunt    Parkinson's disease Maternal Grandmother    Breast cancer Paternal Grandmother 56 - 30   Dementia Paternal Grandmother    Breast cancer Cousin 68 - 29   Breast cancer Other 59 - 84       maternal great aunt   Colon cancer Neg Hx    Rectal cancer Neg Hx    Stomach cancer Neg Hx     Her Social History Is Significant For: Social History   Socioeconomic History   Marital status: Married    Spouse name: Not on file   Number of children: Not on file   Years of education: Not on file   Highest education level: Not on file  Occupational History   Not on file  Tobacco Use   Smoking status: Former    Current packs/day: 0.00    Types: Cigarettes    Quit date: 06/09/2010    Years since quitting: 14.0   Smokeless tobacco: Never  Vaping Use   Vaping status: Never Used  Substance and Sexual Activity   Alcohol use: Not Currently    Comment: 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 2      Pets: No      Highest level or education completed: Law school      Current/Past profession: Librarian, academic of [employer]       Exercise: No                 Type and how often:          Living Will: Yes    DNR: Yes   POA/HPOA: Yes      Functional Status:   Do you have difficulty bathing or dressing yourself? No   Do you have difficulty preparing food or eating? No   Do you have difficulty managing  your medications? No   Do you have difficulty managing your finances? No   Do you have difficulty affording your medications? No         Update 06/17/2024   Caffeine: 1-2 cups per day   Right handed   Social Drivers of Health   Financial Resource Strain: Medium Risk (11/06/2019)   Received from [facility]   Overall Financial Resource Strain (CARDIA)    Difficulty of Paying Living Expenses: Somewhat hard  Food Insecurity: No Food Insecurity (11/06/2019)   Received from [facility]   Hunger Vital Sign    Within the past 12 months, you worried that your food would run out before you got the money to buy more.: Never true    Within the past 12 months, the food you bought just didn't last and you didn't have money to get more.: Never true  Transportation Needs: No Transportation Needs (11/06/2019)   Received from [facility] - Transportation    Lack of Transportation (Medical): No    Lack of Transportation (Non-Medical): No  Physical Activity: Inactive (03/25/2019)   Received from [facility]   Exercise Vital Sign    On average, how many days per week do you engage in moderate to strenuous exercise (like a brisk walk)?: 0 days    On average, how many minutes do you engage in exercise at this level?: 0 min  Stress: Stress Concern Present (03/25/2019)   Received from [facility] - Occupational Stress Questionnaire    Feeling of Stress : Very much  Social Connections: Unknown (06/18/2023)   Received from [facility]   Social Network    Social Network: Not on file    Her Allergies Are:  Allergies  Allergen Reactions   Gabapentin Swelling   Peanut-Containing Drug Products Anaphylaxis   Shellfish Allergy Hives  and Other (See Comments)    Some shellfish- is able to eat shrimp but not mussels, clams, oysters- hives and lip swelling   Codeine Other (See Comments)    REACTION: lethargic   Lamotrigine Other (See Comments)    REACTION: lips swell   Topiramate Other (See Comments)    REACTION: hallucinations  :   Her Current Medications Are:  Outpatient Encounter Medications as of 06/17/2024  Medication Sig   albuterol  (PROVENTIL  HFA;VENTOLIN  HFA) 108 (90 BASE) MCG/ACT inhaler Inhale 2 puffs into the lungs every 6 (six) hours as needed. sob    amLODipine  (NORVASC ) 2.5 MG tablet Take 1 tablet (2.5 mg total) by mouth daily.   ARIPiprazole  ER (ABILIFY  MAINTENA) 400 MG PRSY prefilled syringe Inject 400 mg into the muscle every 28 (twenty-eight) days.   aspirin  EC 81 MG tablet Take 1 tablet (81 mg total) by mouth daily. Swallow whole.   atorvastatin  (LIPITOR) 10 MG tablet Take 1 tablet (10 mg total) by mouth daily.   methocarbamol  (ROBAXIN -750) 750 MG tablet Take 1 tablet (750 mg total) by mouth 3 (three) times daily as needed for muscle spasms.   oxyCODONE -acetaminophen  (PERCOCET/ROXICET) 5-325 MG tablet Take 1 tablet by mouth every 6 (six) hours as needed for severe pain (pain score 7-10).   Semaglutide ,0.25 or 0.5MG /DOS, (OZEMPIC , 0.25 OR 0.5 MG/DOSE,) 2 MG/3ML SOPN Inject 0.25 mg into the skin once a week.   spironolactone  (ALDACTONE ) 25 MG tablet Take 1 tablet (25 mg total) by mouth daily.   ondansetron  (ZOFRAN -ODT) 8 MG disintegrating tablet Take 1 tablet (8 mg total) by mouth every 8 (eight)  hours as needed for nausea or vomiting. (Patient not taking: Reported on 06/17/2024)   traMADol  (ULTRAM ) 50 MG tablet Take 1 tablet (50 mg total) by mouth every 12 (twelve) hours as needed. (Patient not taking: Reported on 06/17/2024)   [DISCONTINUED] diclofenac  (VOLTAREN ) 75 MG EC tablet Take 1 tablet (75 mg total) by mouth 2 (two) times daily as needed. (Patient not taking: Reported on 06/17/2024)   [DISCONTINUED]  lidocaine  (LIDODERM ) 5 % Place 1 patch onto the skin daily. Remove & Discard patch within 12 hours or as directed by MD (Patient not taking: Reported on 06/17/2024)   [DISCONTINUED] tirzepatide  (ZEPBOUND ) 2.5 MG/0.5ML injection vial Inject 2.5 mg into the skin once a week.   Facility-Administered Encounter Medications as of 06/17/2024  Medication   ARIPiprazole  ER (ABILIFY  MAINTENA) 400 MG prefilled syringe 400 mg  :   Review of Systems:  Out of a complete 14 point review of systems, all are reviewed and negative with the exception of these symptoms as listed below:  Review of Systems  Neurological:        Patient is here alone for evaluation of bilateral hand tremors which started around the beginning of the year. She is right handed. Her left hand seems to be worse. The tremors are not constant. They can occur out of nowhere but mostly it is when she is using her hand, like holding a microphone, fork, or cell phone, for example. Her hand will shake almost uncontrollably. Holding her hand with the other does not work. Nothing makes it better except time. Her maternal grandmother passed from PD. She has fallen twice in the past 6 months. She states she tripped over her own feet.     Objective:  Neurological Exam  Physical Exam Physical Examination:   Vitals:   06/17/24 1437  BP: 136/81  Pulse: 84    General Examination: The patient is a very pleasant 63 y.o. female in no acute distress. She appears well-developed and well-nourished and well groomed.   HEENT: Normocephalic, atraumatic, pupils are equal, round and reactive to light, extraocular tracking is good without limitation to gaze excursion or nystagmus noted. Hearing is grossly intact. Face is symmetric with normal facial animation. Speech is clear with no dysarthria noted. There is no hypophonia. There is no lip, neck/head, jaw or voice tremor. Neck is supple with full range of passive and active motion. There are no carotid bruits  on auscultation. Oropharynx exam reveals: mild mouth dryness, good dental hygiene.  Tongue protrudes centrally and palate elevates symmetrically.   Chest: Clear to auscultation without wheezing, rhonchi or crackles noted.  Heart: S1+S2+0, regular and normal without murmurs, rubs or gallops noted.   Abdomen: Soft, non-tender and non-distended.  Extremities: There is no pitting edema in the distal lower extremities bilaterally.   Skin: Warm and dry without trophic changes noted.   Musculoskeletal: exam reveals no obvious joint deformities.   Neurologically:  Mental status: The patient is awake, alert and oriented in all 4 spheres. Her immediate and remote memory, attention, language skills and fund of knowledge are appropriate. There is no evidence of aphasia, agnosia, apraxia or anomia. Speech is clear with normal prosody and enunciation. Thought process is linear. Mood is normal and affect is normal.  Cranial nerves II - XII are as described above under HEENT exam.  Motor exam: Normal bulk, strength and tone is noted. There is no obvious action or resting tremor.  No significant postural or intention tremor.  No drift or rebound.  Reflexes 1+ throughout, toes are downgoing bilaterally. On 06/17/2024: On Archimedes spiral drawing she has no significant trembling with the right hand, mild insecurity with the left hand.  Handwriting with the right hand is legible, not tremulous, not micrographic. Fine motor skills and coordination: intact finger taps, hand movements and rapid alternating patting with both upper extremities, normal foot taps bilaterally in the lower extremities.  Cerebellar testing: No dysmetria or intention tremor. There is no truncal or gait ataxia.  Normal finger-to-nose, normal heel-to-shin bilaterally, just some difficulty with range of motion. Sensory exam: intact to light touch and temperature in the upper and lower extremities.  Gait, station and balance: She stands easily.  No veering to one side is noted. No leaning to one side is noted. Posture is age-appropriate and stance is narrow based. Gait shows normal stride length and normal pace. No problems turning are noted.   Assessment and Plan:   In summary, TAUSHA MILHOAN is a very pleasant 63 y.o.-year old female with an underlying complex medical history of asthma, emphysema, breast cancer, headaches, history of pulmonary embolism, lumbar herniated disc, hypertension, mood disorder including bipolar disorder, and severe obesity with a BMI of over 45, who presents for evaluation of her intermittent hand tremors of about 6 to 7 months duration.  History and examination are not in keeping with essential tremor or parkinsonism or Parkinson's disease.  Neurological exam is largely nonfocal and she is advised that there is no pressing reason to pursue an imaging test such as a brain MRI at this point.   I had a long chat with the patient today.  She is largely reassured today.  Below is a summary of my recommendations and are talking points based on today's visit history, chart review and exam.  She was given these instructions verbally during the visit and also in writing in her after visit summary which she can access electronically:  <<You have an intermittent hand tremor, no obvious tremor was noted today during our exam.  I also did not see any signs or symptoms of parkinson's like disease or what we call parkinsonism.  For your tremor, I would not recommend any new medications at this time.  Your tremor may be secondary to medication effect, suboptimal hydration with water, lack of sleep or poor sleep and stress. Please remember, that any kind of tremor may be exacerbated by anxiety, anger, nervousness, excitement, dehydration, sleep deprivation, thyroid dysfunction, by caffeine, and low blood sugar values or blood sugar fluctuations. Some medications can exacerbate tremors, this includes certain asthma or COPD  medications and certain antidepressants.  Please call your pulmonologist office and pursue your sleep study with them. I recommend that you hydrate better with water, we typically recommend 8 cups of water per day, 8 ounce size each. Limit your caffeine to about 1 to maybe 2 servings per day on average, coffee and/or soda. Keep in mind that both Abilify  and lithium  can cause tremors and Parkinson's-like changes called parkinsonism. I do recommend that you get your thyroid function checked as planned. Follow-up with your primary care and your current providers.  >> Thank you very much for allowing me to participate in the care of this nice patient. If I can be of any further assistance to you please do not hesitate to call me at [phone].  Sincerely,   True Mar, MD, PhD

## 2024-06-17 NOTE — Addendum Note (Signed)
 Addended by: DARLEAN TAWNI CROME on: 06/17/2024 01:04 PM   Modules accepted: Level of Service

## 2024-06-17 NOTE — Patient Instructions (Signed)
 Needs labs done in the morning fasting to check kidney function, sugar, and lipids  Reduce carb and fats  Try to exercise 3-5 times per week

## 2024-06-17 NOTE — Addendum Note (Signed)
 Addended by: CARVIN CROCK on: 06/17/2024 10:50 AM   Modules accepted: Level of Service

## 2024-06-17 NOTE — Patient Instructions (Signed)
 You have an intermittent hand tremor, no obvious tremor was noted today during our exam.  I also did not see any signs or symptoms of parkinson's like disease or what we call parkinsonism.  For your tremor, I would not recommend any new medications at this time.  Your tremor may be secondary to medication effect, suboptimal hydration with water, lack of sleep or poor sleep and stress. Please remember, that any kind of tremor may be exacerbated by anxiety, anger, nervousness, excitement, dehydration, sleep deprivation, thyroid dysfunction, by caffeine, and low blood sugar values or blood sugar fluctuations. Some medications can exacerbate tremors, this includes certain asthma or COPD medications and certain antidepressants.  Please call your pulmonologist office and pursue your sleep study with them. I recommend that you hydrate better with water, we typically recommend 8 cups of water per day, 8 ounce size each. Limit your caffeine to about 1 to maybe 2 servings per day on average, coffee and/or soda. Keep in mind that both Abilify  and lithium  can cause tremors and Parkinson's-like changes called parkinsonism. I do recommend that you get your thyroid function checked as planned. Follow-up with your primary care and your current providers.

## 2024-06-18 ENCOUNTER — Ambulatory Visit: Admitting: Family

## 2024-06-18 ENCOUNTER — Telehealth: Payer: Self-pay

## 2024-06-18 ENCOUNTER — Other Ambulatory Visit

## 2024-06-18 NOTE — Telephone Encounter (Signed)
 Incoming fax received from patients pharmacy to initiate a prior authorization for                   .  PA initiated through covermymeds. Key: [id number]  Awaiting reply from the insurance company which will be determined in 48-72 hours.

## 2024-06-19 ENCOUNTER — Other Ambulatory Visit

## 2024-06-22 NOTE — Telephone Encounter (Signed)
 Most appropriate study is an in-lab study for sleep apnea if there is concern for obesity hypoventilation,  If patient declines to have an in-lab study, home sleep test can be ordered-it is not the appropriate study

## 2024-06-25 NOTE — Telephone Encounter (Signed)
ATC x1 LVM for patient to call our office back. 

## 2024-07-02 NOTE — Telephone Encounter (Signed)
 Message in Covermymeds

## 2024-07-10 ENCOUNTER — Encounter (HOSPITAL_BASED_OUTPATIENT_CLINIC_OR_DEPARTMENT_OTHER): Admitting: Pulmonary Disease

## 2024-07-13 ENCOUNTER — Ambulatory Visit (HOSPITAL_BASED_OUTPATIENT_CLINIC_OR_DEPARTMENT_OTHER)

## 2024-07-13 VITALS — BP 131/88 | HR 98 | Ht 64.0 in | Wt 268.6 lb

## 2024-07-13 DIAGNOSIS — F319 Bipolar disorder, unspecified: Secondary | ICD-10-CM

## 2024-07-13 NOTE — Progress Notes (Signed)
 Patient arrives today for her due injection of Abilify  Maintena 400 mg. Patient presents well groomed and with an appropriate affect. Patient is coming from work, she denies any SI/HI or AVH. Injection was prepared as ordered and given in patients Left Deltoid. Patient tolerated well and without complaint and will return in 28 days.    NDC: 40851-927-19 LOT: [id number] EXP: NOV 2026

## 2024-07-17 ENCOUNTER — Telehealth: Payer: Self-pay | Admitting: Pulmonary Disease

## 2024-07-17 NOTE — Telephone Encounter (Signed)
 I contacted the patient for more understanding. Patient called wanting to do a at home sleep study instead of In-lab. She is not interested in doing In-lab and does not want to be rescheduled.

## 2024-07-17 NOTE — Telephone Encounter (Signed)
 Copied from CRM [id number]. Topic: General - Other >> Jul 17, 2024 11:05 AM [name] wrote: Reason for CRM: patient would like to know how can she have a home sleep study done, please contact patient with more information.

## 2024-07-17 NOTE — Telephone Encounter (Signed)
 With concern for obesity hypoventilation.  An in-lab study is the most appropriate study.

## 2024-07-22 ENCOUNTER — Ambulatory Visit: Admitting: Family

## 2024-07-29 ENCOUNTER — Ambulatory Visit (INDEPENDENT_AMBULATORY_CARE_PROVIDER_SITE_OTHER): Admitting: Pulmonary Disease

## 2024-07-29 VITALS — BP 130/89 | HR 87 | Ht 64.0 in | Wt 268.0 lb

## 2024-07-29 DIAGNOSIS — R0683 Snoring: Secondary | ICD-10-CM | POA: Diagnosis not present

## 2024-07-29 DIAGNOSIS — J438 Other emphysema: Secondary | ICD-10-CM | POA: Diagnosis not present

## 2024-07-29 DIAGNOSIS — R0602 Shortness of breath: Secondary | ICD-10-CM

## 2024-07-29 DIAGNOSIS — G478 Other sleep disorders: Secondary | ICD-10-CM

## 2024-07-29 NOTE — Patient Instructions (Addendum)
 We will make sure your sleep study schedule the in-lab study  Graded exercises as tolerated  Your breathing study is within normal limits  Your echocardiogram is within normal limits  Call us  with significant concerns  Follow-up in 3 months

## 2024-07-29 NOTE — Progress Notes (Unsigned)
 [name]    [id number]    October 14, 1961  Primary Care Physician:Ngetich, Roxan BROCKS, NP  Referring Physician: Neda Jennet LABOR, MD [address] [place],  [place] [address]  Chief complaint:   Patient being seen for concern for sleep disordered breathing  HPI:  Patient with snoring, nonrestorative sleep Wakes up feeling unrested Denies soreness of the mouth or throat in the morning No morning headaches No night sweats Memory is good Both parents with sleep apnea Does admit to choking respirations at night , sleepiness during the day  Reformed smoker most smoked half a pack a day  Weight gain in the last 3 to 6 months up about 30 pounds  Shortness of breath with mild to moderate exertion sometimes just walking the dog gets short of breath  Usually goes to bed between 10 and 1030 Takes about an hour to fall asleep 2-4 awakenings Final wake up time about 7 AM  Quit smoking in 2011  She has a previous history of pulmonary embolism in 2001  Outpatient Encounter Medications as of 07/29/2024  Medication Sig   albuterol  (PROVENTIL  HFA;VENTOLIN  HFA) 108 (90 BASE) MCG/ACT inhaler Inhale 2 puffs into the lungs every 6 (six) hours as needed. sob    amLODipine  (NORVASC ) 2.5 MG tablet Take 1 tablet (2.5 mg total) by mouth daily.   ARIPiprazole  ER (ABILIFY  MAINTENA) 400 MG PRSY prefilled syringe Inject 400 mg into the muscle every 28 (twenty-eight) days.   aspirin  EC 81 MG tablet Take 1 tablet (81 mg total) by mouth daily. Swallow whole.   atorvastatin  (LIPITOR) 10 MG tablet Take 1 tablet (10 mg total) by mouth daily.   methocarbamol  (ROBAXIN -750) 750 MG tablet Take 1 tablet (750 mg total) by mouth 3 (three) times daily as needed for muscle spasms.   ondansetron  (ZOFRAN -ODT) 8 MG disintegrating tablet Take 1 tablet (8 mg total) by mouth every 8 (eight) hours as needed for nausea or vomiting.   oxyCODONE -acetaminophen  (PERCOCET/ROXICET) 5-325 MG tablet  Take 1 tablet by mouth every 6 (six) hours as needed for severe pain (pain score 7-10).   spironolactone  (ALDACTONE ) 25 MG tablet Take 1 tablet (25 mg total) by mouth daily.   traMADol  (ULTRAM ) 50 MG tablet Take 1 tablet (50 mg total) by mouth every 12 (twelve) hours as needed.   Facility-Administered Encounter Medications as of 07/29/2024  Medication   ARIPiprazole  ER (ABILIFY  MAINTENA) 400 MG prefilled syringe 400 mg    Allergies as of 07/29/2024 - Review Complete 07/29/2024  Allergen Reaction Noted   Gabapentin Swelling 11/07/2011   Peanut-containing drug products Anaphylaxis 02/27/2024   Shellfish allergy Hives and Other (See Comments) 06/26/2012   Codeine Other (See Comments) 08/22/2010   Lamotrigine Other (See Comments) 08/22/2010   Topiramate Other (See Comments) 12/19/2010    Past Medical History:  Diagnosis Date   Anginal pain (HCC)    11/2011   Asthma    Bipolar 1 disorder (HCC)    Bipolar affective disorder, depressed, mild (HCC)    Remote history of suicidal attempts   Breast cancer (HCC)    In 2005 treated with surgery and Arimidex for 5 years   Depression    Headache(784.0)    h/o migraines    History of pulmonary embolism    Bilateral in 2001   HNP (herniated nucleus pulposus)    2000- L4-5, used pain mgt. prog. in [place]    Hypertension    Personal history of chemotherapy  Personal history of radiation therapy    Shortness of breath    in the past    Past Surgical History:  Procedure Laterality Date   ABDOMINAL HYSTERECTOMY     BREAST BIOPSY Left 12/2020   BREAST LUMPECTOMY Right    2005   MASTOPEXY  02/07/2012   Procedure: MASTOPEXY;  Surgeon: Estefana Reichert, DO;  Location: [facility];  Service: Plastics;  Laterality: Left;  left breast mastopexy/reduction for symmetry after right breast cancer   ORIF ANKLE FRACTURE  06/26/2012   Procedure: OPEN REDUCTION INTERNAL FIXATION (ORIF) ANKLE FRACTURE;  Surgeon: Cordella Glendia Hutchinson, MD;   Location: [facility] OR;  Service: Orthopedics;  Laterality: Right;   REDUCTION MAMMAPLASTY Left    TUBAL LIGATION      Family History  Problem Relation Age of Onset   Breast cancer Mother 57   Prostate cancer Father 4   Heart attack Brother    Diabetes Maternal Aunt    Cancer Paternal Aunt    Parkinson's disease Maternal Grandmother    Breast cancer Paternal Grandmother 38 - 4   Dementia Paternal Grandmother    Breast cancer Cousin 64 - 63   Breast cancer Other 20 - 42       maternal great aunt   Colon cancer Neg Hx    Rectal cancer Neg Hx    Stomach cancer Neg Hx     Social History   Socioeconomic History   Marital status: Married    Spouse name: Not on file   Number of children: Not on file   Years of education: Not on file   Highest education level: Not on file  Occupational History   Not on file  Tobacco Use   Smoking status: Former    Current packs/day: 0.00    Types: Cigarettes    Quit date: 06/09/2010    Years since quitting: 14.1   Smokeless tobacco: Never  Vaping Use   Vaping status: Never Used  Substance and Sexual Activity   Alcohol use: Not Currently    Comment: 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 2      Pets: No      Highest level or education completed: Law school      Current/Past profession: Librarian, academic of [employer]       Exercise: No                 Type and how often:          Living Will: Yes   DNR: Yes   POA/HPOA: Yes      Functional Status:   Do you have difficulty  bathing or dressing yourself? No   Do you have difficulty preparing food or eating? No   Do you have difficulty managing your medications? No   Do you have difficulty managing your finances? No   Do you have difficulty  affording your medications? No         Update 06/17/2024   Caffeine: 1-2 cups per day   Right handed   Social Drivers of Health   Financial Resource Strain: Medium Risk (11/06/2019)   Received from [facility]   Overall Financial Resource Strain (CARDIA)    Difficulty of Paying Living Expenses: Somewhat hard  Food Insecurity: No Food Insecurity (11/06/2019)   Received from [facility]   Hunger Vital Sign    Within the past 12 months, you worried that your food would run out before you got the money to buy more.: Never true    Within the past 12 months, the food you bought just didn't last and you didn't have money to get more.: Never true  Transportation Needs: No Transportation Needs (11/06/2019)   Received from [facility] - Transportation    Lack of Transportation (Medical): No    Lack of Transportation (Non-Medical): No  Physical Activity: Inactive (03/25/2019)   Received from [facility]   Exercise Vital Sign    On average, how many days per week do you engage in moderate to strenuous exercise (like a brisk walk)?: 0 days    On average, how many minutes do you engage in exercise at this level?: 0 min  Stress: Stress Concern Present (03/25/2019)   Received from [facility] - Occupational Stress Questionnaire    Feeling of Stress : Very much  Social Connections: Unknown (06/18/2023)   [facility]   Social Network    Social Network: Not on file  Intimate Partner Violence: Unknown (06/18/2023)   Received from [facility]   HITS    Physically Hurt: Not on file    Insult or Talk Down To: Not on file    Threaten Physical Harm: Not on file    Scream or Curse: Not on file     Review of Systems  Constitutional:  Positive for fatigue.  Respiratory:  Positive for apnea and shortness of breath.   Cardiovascular:  Positive for leg swelling.  Psychiatric/Behavioral:  Positive for sleep disturbance.     Vitals:   07/29/24 0943  BP: 130/89  Pulse: 87  SpO2: 96%     Physical Exam Constitutional:      Appearance: She is obese.  HENT:     Head: Normocephalic.     Mouth/Throat:     Mouth: Mucous membranes are moist.  Eyes:     General: No scleral icterus. Cardiovascular:     Rate and Rhythm: Normal rate and regular rhythm.     Heart sounds: No murmur heard.    No friction rub.  Pulmonary:     Effort: No respiratory distress.     Breath sounds: No stridor. No wheezing or rhonchi.  Musculoskeletal:     Cervical back: No rigidity or tenderness.  Psychiatric:        Mood and Affect: Mood normal.    Data Reviewed: CT scan chest 05/29/2023 describes paraseptal emphysema, centrilobular emphysema  Assessment:  Shortness of breath Underlying obstructive lung disease - Past history of smoking - Does have a prescription for albuterol  - No PFT on record  Shortness of breath with mild to moderate exertion with leg swelling Progressive weight gain -Thyroid function and July 2024 within normal limits -Will benefit from an echocardiogram  Concern for sleep disordered breathing Concern for obesity hypoventilation - Will benefit from an in lab split-night study  Pathophysiology  of sleep disordered breathing discussed with the patient Treatment options discussed with the patient  Class III obesity - Encouraged to discuss GLP-1 agonist with primary doctor   Plan/Recommendations: Schedule patient for split-night study  Schedule for pulmonary function test  Schedule for echocardiogram  With weight gain, leg swelling, increased shortness of breath,-important to rule out significant cardiac dysfunction, obesity, does have a history of hypertension  which may predispose to diastolic dysfunction  Encourage graded activities as tolerated  Follow-up in 8 to 12 weeks  Encouraged to call with significant concerns   Jennet Epley MD [facility] 07/29/2024, 10:00 AM  CC: Epley Jennet LABOR, MD

## 2024-08-11 ENCOUNTER — Ambulatory Visit (HOSPITAL_COMMUNITY)

## 2024-08-12 ENCOUNTER — Ambulatory Visit (HOSPITAL_COMMUNITY)

## 2024-08-17 ENCOUNTER — Ambulatory Visit (HOSPITAL_BASED_OUTPATIENT_CLINIC_OR_DEPARTMENT_OTHER)

## 2024-08-17 VITALS — BP 156/92 | HR 89 | Ht 64.0 in | Wt 269.0 lb

## 2024-08-17 DIAGNOSIS — F319 Bipolar disorder, unspecified: Secondary | ICD-10-CM | POA: Diagnosis not present

## 2024-08-17 NOTE — Progress Notes (Signed)
 Patient presents today for her due injection of Abilify  Maintena 400 mg. Patient presents well groomed with an appropriate affect. Patient states she is doing well, we discussed a 2 moth shot of Asimtufii and I told the patient to discuss her options with Dr. Kapoor on 10/13. Injection was prepared as ordered and administered in patients RD. Patient tolerated well and without complaint and will return in 28 days.     NDC: 40851-927-19 LOT: [id number] EXP: NOV 2027

## 2024-08-19 ENCOUNTER — Other Ambulatory Visit: Payer: Self-pay | Admitting: Family

## 2024-08-19 DIAGNOSIS — I10 Essential (primary) hypertension: Secondary | ICD-10-CM

## 2024-09-11 ENCOUNTER — Ambulatory Visit (HOSPITAL_BASED_OUTPATIENT_CLINIC_OR_DEPARTMENT_OTHER): Attending: Pulmonary Disease | Admitting: Pulmonary Disease

## 2024-09-11 DIAGNOSIS — R0683 Snoring: Secondary | ICD-10-CM | POA: Insufficient documentation

## 2024-09-11 DIAGNOSIS — G4733 Obstructive sleep apnea (adult) (pediatric): Secondary | ICD-10-CM | POA: Diagnosis not present

## 2024-09-14 NOTE — Progress Notes (Signed)
 BH MD Outpatient Progress Note  09/21/2024 10:45 AM [name]  MRN:  [id number]  Televisit via video: I connected with patient on 09/21/24 at  9:00 AM EDT by a video enabled telemedicine application and verified that I am speaking with the correct person using two identifiers.  Location: Patient: In her car, parked Provider: Clinic   I discussed the limitations of evaluation and management by telemedicine and the availability of in person appointments. The patient expressed understanding and agreed to proceed.  I discussed the assessment and treatment plan with the patient. The patient was provided an opportunity to ask questions and all were answered. The patient agreed with the plan and demonstrated an understanding of the instructions.   The patient was advised to call back or seek an in-person evaluation if the symptoms worsen or if the condition fails to improve as anticipated.   Assessment:  [name] presents for follow-up evaluation.    Identifying Information: [name] is a 63 y.o. y.o. female with a history of bipolar affective disorder and alcohol use disorder, in sustained remission who is an established patient with [facility] for management of bipolar affective disorder.  Patient has continued to stay at a baseline over the past few months and has not needed additional medications/hospitalizations for any other concerns.  Though she has had some continued irritability during the last week of her injection, plan to switch her to Aristada due to higher dose and to target her remaining concerns/symptoms.  She has not been depressed or anxious and has been eating and sleeping well.  She has not been suicidal, homicidal and has had no symptoms of mania/psychosis.  Her follow-up appointment with neurology did not include any findings for her tremors, encouraged to get her B12, folate, thyroid, lipid panel and CMP done  at her primary care provider before the next appointment, patient amenable.  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  She recently got a sleep study done and was found to have a sleep apnea, which is being evaluated further and will likely improve her quality of sleep and irritability symptoms as well.  She is also on Ozempic  for weight loss and has lost 7 to 8 pounds over the past couple months.  She has continued undergoing therapy, reports good therapeutic relationship.  She wants to keep up with 3 monthly appointments, plan to have her back in 12 weeks.  Plan:  # Bipolar affective disorder 1, history of numerous psychiatric hospitalizations, currently in partial remission Interventions: - Start Aristada 441 mg every 28 days to target symptoms that prevail in the last week.  She received last dose of Abilify  Maintena on 09/15/2024 -Propranolol  discontinued in the previous visit - Patient undergoing therapy as outpatient, encouraged to keep up with her appointments --Encouraged patient to get her blood work done before her next appointment   # Inattention, self-reported diagnosis of ADHD Interventions: - Patient possibly has OSA needs a sleep study, encouraged to schedule one soon.  Patient was given contact information for behavioral health clinic and was instructed to call 911 for emergencies.   Subjective:  Chief Complaint:  Chief Complaint  Patient presents with   Bipolar 1 disorder   Medication Refill   Follow-up    Interval History:  Today, the patient reported that she has been doing well.  She denied any active or passive SI/HI/AVH,  reported her sleep as good , and her appetite as good .  Stated that she has recently started Ozempic  and lost 7 to 8 pounds.  She reported compliance on her medications and denied any new physical concerns.  Stated that she is aware of gambling  being a concern on Abilify  however I have been trying to stay away from gambling.  She continues to report mild tremors in her hand mostly during giving talks but stated that it has stayed the same and reported that she had been to a neurologist however it was all normal.  She also got a sleep study done recently and stated that I have sleep apnea , she has an upcoming appointment for her CPAP.  She denied feeling depressed or anxious but stated that  during the last few days of my injection I have become more irritable, looks like the injection wears off .  Stated an improvement in her irritability upon readministration of injection.  She has recently enrolled in an [employer], currently take 2 courses per semester and is also working full-time.  She continues to sponsor 3 people on the road to sobriety.  She denied using any substances including alcohol or cigarettes.  Discussed about changing Abilify  Maintena to Aristada due to a higher dose and to counter irritability, patient amenable to the plan.  Discussed risk benefits and side effects of the medication including weight gain.  She will need to follow-up every 12 weeks, encouraged to call the clinic if she had any side effects or any physical concerns.  Visit Diagnosis:    ICD-10-CM   1. Bipolar 1 disorder (HCC)  F31.9 ARIPiprazole  Lauroxil ER (ARISTADA) 441 MG/1.6ML prefilled syringe    ARIPiprazole  Lauroxil ER (ARISTADA) 441 MG/1.6ML prefilled syringe 441 mg        Past Psychiatric History:  From initial evaluation: 63 y.o. female with a history of bipolar affective disorder 1 who presents in person to [facility] for initial evaluation of medication management for bipolar disorder.  Patient reports being diagnosed with manic depressive illness at the age of 49, and having approximately 20 psychiatric hospitalizations until achieving sobriety from alcohol when she was approximately 63 years old.  Since becoming  sober, the patient reports having no psychiatric hospitalizations until she decided, in conjunction with her psychiatrist, to taper off of Abilify .  She had a depressive relapse and was admitted to a psychiatric [facility] near her home in [place] .  She was stabilized on Abilify  and given a long-acting injectable of the medication.  She states that she had been doing well since that time and recently moved to [place]  because of work obligations.  Unfortunately, she became nonadherent to the injection as a result of having no provider in [place] .   Past Medical History:  Past Medical History:  Diagnosis Date   Anginal pain    11/2011   Asthma    Bipolar 1 disorder (HCC)    Bipolar affective disorder, depressed, mild (HCC)    Remote history of suicidal attempts   Breast cancer (HCC)    In 2005 treated with surgery and Arimidex for 5 years   Depression    Headache(784.0)    h/o migraines    History of pulmonary embolism    Bilateral in 2001   HNP (herniated nucleus pulposus)    2000- L4-5, used pain mgt. prog. in [place]    Hypertension    Personal history of chemotherapy    Personal history of radiation therapy  Shortness of breath    in the past    Past Surgical History:  Procedure Laterality Date   ABDOMINAL HYSTERECTOMY     BREAST BIOPSY Left 12/2020   BREAST LUMPECTOMY Right    2005   MASTOPEXY  02/07/2012   Procedure: MASTOPEXY;  Surgeon: Estefana Reichert, DO;  Location: [facility];  Service: Plastics;  Laterality: Left;  left breast mastopexy/reduction for symmetry after right breast cancer   ORIF ANKLE FRACTURE  06/26/2012   Procedure: OPEN REDUCTION INTERNAL FIXATION (ORIF) ANKLE FRACTURE;  Surgeon: Cordella Glendia Hutchinson, MD;  Location: [facility];  Service: Orthopedics;  Laterality: Right;   REDUCTION MAMMAPLASTY Left    TUBAL LIGATION      Family Psychiatric History: None pertinent  Family History:  Family History  Problem Relation Age of Onset    Breast cancer Mother 9   Prostate cancer Father 44   Heart attack Brother    Diabetes Maternal Aunt    Cancer Paternal Aunt    Parkinson's disease Maternal Grandmother    Breast cancer Paternal Grandmother 58 - 51   Dementia Paternal Grandmother    Breast cancer Cousin 39 - 60   Breast cancer Other 63 - 32       maternal great aunt   Colon cancer Neg Hx    Rectal cancer Neg Hx    Stomach cancer Neg Hx     Social History:  Social History   Socioeconomic History   Marital status: Married    Spouse name: Not on file   Number of children: Not on file   Years of education: Not on file   Highest education level: Not on file  Occupational History   Not on file  Tobacco Use   Smoking status: Former    Current packs/day: 0.00    Types: Cigarettes    Quit date: 06/09/2010    Years since quitting: 14.2   Smokeless tobacco: Never  Vaping Use   Vaping status: Never Used  Substance and Sexual Activity   Alcohol use: Not Currently    Comment: 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 2      Pets: No      Highest level or education completed: Law school      Current/Past profession: Librarian, academic of [employer]       Exercise: No                 Type and how often:          Living Will: Yes   DNR: Yes   POA/HPOA: Yes      Functional Status:   Do you have difficulty bathing or dressing yourself? No   Do you have difficulty preparing food or eating? No   Do you have difficulty managing your medications? No   Do  you have difficulty managing your finances? No  Do you have difficulty affording your medications? No         Update 06/17/2024   Caffeine: 1-2 cups per day   Right handed   Social Drivers of Health   Financial Resource Strain: Medium Risk (11/06/2019)   Received from [facility]   Overall Financial Resource Strain (CARDIA)    Difficulty of Paying Living Expenses: Somewhat hard  Food Insecurity: No Food Insecurity (11/06/2019)   Received from [facility]   Hunger Vital Sign    Within the past 12 months, you worried that your food would run out before you got the money to buy more.: Never true    Within the past 12 months, the food you bought just didn't last and you didn't have money to get more.: Never true  Transportation Needs: No Transportation Needs (11/06/2019)   Received from [facility] - Transportation    Lack of Transportation (Medical): No    Lack of Transportation (Non-Medical): No  Physical Activity: Inactive (03/25/2019)   Received from [facility]   Exercise Vital Sign    On average, how many days per week do you engage in moderate to strenuous exercise (like a brisk walk)?: 0 days    On average, how many minutes do you engage in exercise at this level?: 0 min  Stress: Stress Concern Present (03/25/2019)   Received from [facility] - Occupational Stress Questionnaire    Feeling of Stress : Very much  Social Connections: Unknown (06/18/2023)   Received from [facility]   Social Network    Social Network: Not on file    Allergies:  Allergies  Allergen Reactions   Gabapentin Swelling   Peanut-Containing Drug Products Anaphylaxis   Shellfish Allergy Hives and Other (See Comments)    Some shellfish- is able to eat shrimp but not mussels, clams, oysters- hives and lip swelling   Codeine Other (See Comments)    REACTION: lethargic   Lamotrigine Other (See Comments)    REACTION: lips  swell   Topiramate Other (See Comments)    REACTION: hallucinations    Current Medications: Current Outpatient Medications  Medication Sig Dispense Refill   [START ON 10/13/2024] ARIPiprazole  Lauroxil ER (ARISTADA) 441 MG/1.6ML prefilled syringe Inject 441 mg into the muscle every 28 (twenty-eight) days. 1.6 mL 11   albuterol  (PROVENTIL  HFA;VENTOLIN  HFA) 108 (90 BASE) MCG/ACT inhaler Inhale 2 puffs into the lungs every 6 (six) hours as needed. sob      amLODipine  (NORVASC ) 2.5 MG tablet Take 1 tablet by mouth once daily 60 tablet 0   aspirin  EC 81 MG tablet Take 1 tablet (81 mg total) by mouth daily. Swallow whole.     atorvastatin  (LIPITOR) 10 MG tablet Take 1 tablet (10 mg total) by mouth daily. 90 tablet 3   methocarbamol  (ROBAXIN -750) 750 MG tablet Take 1 tablet (750 mg total) by mouth 3 (three) times daily as needed for muscle spasms. 20 tablet 1   ondansetron  (ZOFRAN -ODT) 8 MG disintegrating tablet Take 1 tablet (8 mg total) by mouth every 8 (eight) hours as needed for nausea or vomiting. 10 tablet 0   oxyCODONE -acetaminophen  (PERCOCET/ROXICET) 5-325 MG tablet Take 1 tablet by mouth every 6 (six) hours as needed for severe pain (pain score 7-10). 15 tablet 0   spironolactone  (ALDACTONE ) 25 MG tablet Take 1 tablet (25 mg total) by mouth daily. 90 tablet 1   traMADol  (ULTRAM ) 50 MG tablet Take 1 tablet (50 mg  total) by mouth every 12 (twelve) hours as needed. 30 tablet 0   Current Facility-Administered Medications  Medication Dose Route Frequency Provider Last Rate Last Admin   [START ON 10/13/2024] ARIPiprazole  Lauroxil ER (ARISTADA) 441 MG/1.6ML prefilled syringe 441 mg  441 mg Intramuscular Q28 days          Objective:  Psychiatric Specialty Exam: Physical Exam Constitutional:      Appearance: the patient is not toxic-appearing.  Pulmonary:     Effort: Pulmonary effort is normal.  Neurological:     General: No focal deficit present.     Mental Status: the patient is alert and  oriented to person, place, and time.   Review of Systems  Respiratory:  Negative for shortness of breath.   Cardiovascular:  Negative for chest pain.  Gastrointestinal:  Negative for abdominal pain, constipation, diarrhea, nausea and vomiting.  Neurological:  Negative for headaches.      There were no vitals taken for this visit.  General Appearance: Fairly Groomed  Eye Contact:  Good  Speech:  Clear and Coherent  Volume:  Normal  Mood:  Euthymic  Affect:  Congruent  Thought Process:  Coherent  Orientation:  Full (Time, Place, and Person)  Thought Content: Logical   Suicidal Thoughts:  No  Homicidal Thoughts:  No  Memory:  Immediate;   Good  Judgement:  fair  Insight:  fair  Psychomotor Activity:  Normal  Concentration:  Concentration: Good  Recall:  Good  Fund of Knowledge: Good  Language: Good  Akathisia:  No  Handed:    AIMS (if indicated): not done  Assets:  Communication Skills Desire for Improvement Financial Resources/Insurance Housing Leisure Time Physical Health  ADL's:  Intact  Cognition: WNL  Sleep:  Fair     Metabolic Disorder Labs: No results found for: HGBA1C, MPG No results found for: PROLACTIN Lab Results  Component Value Date   CHOL 282 (H) 06/12/2023   TRIG 112 06/12/2023   HDL 55 06/12/2023   CHOLHDL 5.1 (H) 06/12/2023   VLDL 26 11/14/2011   LDLCALC 202 (H) 06/12/2023   LDLCALC 82 11/14/2011   Lab Results  Component Value Date   TSH 1.21 06/12/2023   TSH 2.697 11/14/2011    Therapeutic Level Labs: Lab Results  Component Value Date   LITHIUM  0.2 (L) 09/17/2023   LITHIUM  0.27 (L) 11/13/2011   No results found for: VALPROATE No results found for: CBMZ  Screenings: GAD-7    Flowsheet Row Office Visit from 03/04/2024 in [facility]  Total GAD-7 Score 0   PHQ2-9    Flowsheet Row Video Visit from 09/21/2024 in [facility] Most recent  reading at 09/21/2024  9:48 AM Office Visit from 06/17/2024 in [facility] Most recent reading at 06/17/2024 11:13 AM Office Visit from 06/17/2024 in [facility] Most recent reading at 06/17/2024  9:35 AM Office Visit from 04/30/2024 in [facility] Most recent reading at 04/30/2024  3:34 PM Office Visit from 03/04/2024 in [facility] Most recent reading at 03/04/2024  1:23 PM  PHQ-2 Total Score 0 0 0 0 0  PHQ-9 Total Score -- 4 -- 0 0   Flowsheet Row ED from 02/27/2024 in [facility] ED from 11/16/2023 in [facility]  C-SSRS RISK CATEGORY  No Risk No Risk    Collaboration of Care: none  A total of 30 minutes was spent involved in face to face clinical care, chart review, documentation.   Chauntae Hults, MD 09/21/2024, 10:45 AM

## 2024-09-15 ENCOUNTER — Ambulatory Visit (HOSPITAL_COMMUNITY): Admitting: *Deleted

## 2024-09-15 VITALS — BP 138/83 | HR 78 | Resp 18 | Ht 64.0 in | Wt 268.8 lb

## 2024-09-15 DIAGNOSIS — F319 Bipolar disorder, unspecified: Secondary | ICD-10-CM

## 2024-09-15 NOTE — Patient Instructions (Signed)
 Pt presents to clinic today for due Abilify  Maintena 400 mg injection. Pt affect blunted but is appropriate, cooperative, and pleasant on approach. Pt denies any issues since last injection with the exception of some irritability 2-3 days prior to next injection. Pt denies any thoughts of self harm, Si, Hi, or AVH. Injection prepared as ordered and administered in LD without complaint. Pt to return in 28 days fro next due injection as ordered.

## 2024-09-16 ENCOUNTER — Telehealth: Payer: Self-pay | Admitting: Pulmonary Disease

## 2024-09-16 DIAGNOSIS — G4733 Obstructive sleep apnea (adult) (pediatric): Secondary | ICD-10-CM

## 2024-09-16 NOTE — Telephone Encounter (Signed)
 Call patient  Sleep study result  Date of study: 09/11/2024  Impression: Moderate obstructive sleep apnea with mild oxygen desaturations  Recommendation: DME referral  Recommend CPAP therapy for moderate obstructive sleep apnea.  CPAP of 13 with an EPR of 1 which heated humidification with patient's mask of choice Patient used a medium size ResMed P30 I nasal pillow mask  Encourage weight loss measures.  Schedule follow-up in the office 4 to 6 weeks after initiation. of treatment

## 2024-09-16 NOTE — Procedures (Signed)
 OVERLAPPING EVENTS DETECTED!  [facility] [address] [place], [place] [address] Tel: [phone]   Fax: [phone]  Split Night Interpretation  Patient Name:  [name], [name] Study Date:  09/11/2024 Referring Physician:  JENNET EPLEY [phone]) %%startinterp%% Indications for Polysomnography The patient is a 63 year old Female who is 5' 4 and weighs 268.0 lbs.  Her BMI equals 46.3.  A diagnostic polysomnogram was performed to evaluate for -.  After 168.5 minutes of sleep time the patient exhibited sufficient respiratory events qualifying her for a CPAP trial which was then initiated.    Medication  None Taken   Polysomnogram Data A full night polysomnogram was performed recording the standard physiologic parameters including EEG, EOG, EMG, EKG, nasal and oral airflow.  Respiratory parameters of chest and abdominal movements are recorded with Peizo-Crystal motion transducers.  Oxygen saturation was recorded by pulse oximetry.    Sleep Architecture The total recording time of the diagnostic portion of the study was 191.0 minutes.  The total sleep time was 168.5 minutes.  During the diagnostic portion of the study, the patient spent 3.0% of total sleep time in Stage N1, 54.6% in Stage N2, 27.9% in Stages N3, and 14.5% in REM.   Sleep latency was 8.5 minutes.  REM latency was 62.5 minutes.  Sleep Efficiency was 88.2%.  Wake after Sleep Onset time was 14.0 minutes.   At 01:42:19 AM the patient was placed on PAP treatment and was titrated at pressures ranging from 5* cm/H20 with supplemental oxygen at - up to 13* cm/H20 with supplemental oxygen at -.  The total recording time of the treatment portion of the study was 216.4 minutes.  The total sleep time was 190.5 minutes.  During the treatment portion of the study, the patient spent 4.5% of total sleep time in Stage N1, 60.4% in Stage N2, 15.2% in Stages N3, and 19.9% in REM.   Sleep latency was  4.0 minutes.  REM latency was 49.0 minutes.  Sleep Efficiency was 88.0%.  Wake after Sleep Onset time was 22.0 minutes.  Respiratory Events During the diagnostic portion of the study, the polysomnogram revealed a presence of - obstructive, - central, and - mixed apneas resulting in an Apnea index of - events per hour.  There were 50 hypopneas (>=3% desaturation and/or arousal) resulting in an Apnea\Hypopnea Index (AHI >=3% desaturation and/or arousal) of 17.8 events per hour.  There were 25 hypopneas (>=4% desaturation) resulting in an Apnea\Hypopnea Index (AHI >=4% desaturation) of 8.9 events per hour.  There were - Respiratory Effort Related Arousals resulting in a RERA index of - events per hour. The Respiratory Disturbance Index is 17.8 events per hour.  The snore index was 179.1 events per hour.  Mean oxygen saturation was 92.4%.  The lowest oxygen saturation during sleep was 84.0%.  Time spent <=88% oxygen saturation was 1.3 minutes (0.7%).  During the treatment portion of the study, the polysomnogram revealed a presence of - obstructive, - central, and - mixed apneas resulting in an Apnea index of - events per hour.  There were 36 hypopneas (>=3% desaturation and/or arousal) resulting in an Apnea\Hypopnea Index (AHI >=3% desaturation and/or arousal) of 11.3 events per hour.  There were 6 hypopneas (>=4% desaturation) resulting in an Apnea\Hypopnea Index (AHI >=4% desaturation) of 1.9 events per hour.  There were 3 Respiratory Effort Related Arousals resulting in a RERA index of 0.9 events per hour. The Respiratory Disturbance Index is 12.3 events per hour.  The snore index was 130.1 events per hour.  Mean oxygen saturation was 94.0%.  The lowest oxygen saturation during sleep was 91.0%.  Time spent <=88% oxygen saturation was - minutes (-).  Limb Activity During the diagnostic portion of the study, there were - limb movements recorded.  Of this total, - were classified as PLMs.  Of the PLMs, - were  associated with arousals.  The Limb Movement index was - per hour while the PLM index was - per hour.  During the treatment portion of the study, there were - limb movements recorded.  Of this total, - were classified as PLMs.  Of the PLMs, - were associated with arousals.  The Limb Movement index was - per hour while the PLM index was - per hour.  Cardiac Summary During the diagnostic portion of the study, the average pulse rate was 80.6 bpm.  The minimum pulse rate was 64.0 bpm while the maximum pulse rate was 113.0 bpm.  During the treatment portion of the study, the average pulse rate was 76.1 bpm.  The minimum pulse rate was 64.0 bpm while the maximum pulse rate was 97.0 bpm.   Comments: Patient with moderate obstructive sleep apnea, patient was successfully titrated to CPAP therapy  Diagnosis:  Moderate obstructive sleep apnea with mild oxygen desaturations. Started on CPAP therapy, CPAP titrated to a maximum pressure of 13 which was well-tolerated.  Excellent sleep efficiency No significant periodic limb movement Cardiac rhythm was sinus  Recommendations: CPAP of 13 with an EPR of 1 with heated humidification with patient's mask of choice Patient used a medium sized ResMed P 30I nasal pillow mask Encourage weight loss measures Clinical follow-up for optimization of treatment  This study was personally reviewed and electronically signed by: Jennet Epley, MD Accredited Board Certified in Sleep Medicine Date/Time:   09/16/24  %%endinterp%%  Split Night Report  Patient Name: [name], [name] Study Date: 09/11/2024  Date of Birth: 04/06/1961 Study Type: Split Night  Age: 63 year MRN #: [id number]  Sex: Female Interpreting Physician: EPLEY JENNET, [id number]  Height: 5' 4 Referring Physician: JENNET EPLEY [phone])  Weight: 268.0 lbs Recording Tech: Silvano Neeriemer RPSGT RST  BMI: 46.3 Scoring Tech: Holly Neeriemer RPSGT RST  ESS: 9 Neck Size: 15.25  Mask Type  ResMed P30I Nasal pillows mask Final Pressure: 13  Mask Size: Medium pillows Supplemental O2: N/A   Study Overview  DIAGNOSTIC TREATMENT  Lights Off: 10:31:10 PM Lights Off: 01:42:12 AM  Lights On: 01:42:12 AM Lights On: 05:18:39 AM  Time in Bed: 191.0 min. Time in Bed: 216.4 min.  Total Sleep Time: 168.5 min. Total Sleep Time: 190.5 min.  Sleep Efficiency: 88.2% Sleep Efficiency: 88.0%  Sleep Latency: 8.5 min. Sleep Latency: 4.0 min.  REM Latency from Sleep Onset: 62.5 min. REM Latency from Sleep Onset: 49.0 min.  Wake After Sleep Onset: 14.0 min. Wake After Sleep Onset: 22.0 min.   DIAGNOSTIC TREATMENT   Count Index  Count Index  Awakenings: 8 2.8 Awakenings: 9 2.8  Arousals: 4 1.4 Arousals: 18 5.7  AHI (>=3% Desat and/or Ar.): 50 17.8 AHI (>=3% Desat and/or Ar.): 36 11.3  AHI (>=4% Desat): 25 8.9 AHI (>=4% Desat): 6 1.9   Limb Movements: - - Limb Movements: - -  Snore: 503 179.1 Snore: 413 130.1  Desaturations: 50 17.8 Desaturations: 37 11.7  Minimum SpO2 TST: 84.0% Minimum SpO2 TST: 91.0%    Sleep Architecture   DIAGNOSTIC TREATMENT ENTIRE NIGHT  Stages Time (mins) % Sleep Time  Time (mins) % Sleep Time Time (mins) % Sleep Time  Wake 23.0  26.0  49.0   Stage N1 5.0 3.0% 8.5 4.5% 13.5 3.8%  Stage N2 92.0 54.6% 115.0 60.4% 207.0 57.7%  Stage N3 47.0 27.9% 29.0 15.2% 76.0 21.2%  REM 24.5 14.5% 38.0 19.9% 62.5 17.4%   Arousal Summary   DIAGNOSTIC TREATMENT   NREM REM TST Index NREM REM TST Index  Respiratory Ar. - 1 1 0.4 3 1 4  1.3  PLM Ar. - - - - - - - -  Isolated Limb Movement Ar. - - - - - - - -  Snore Ar. - - - - - - - -  Spontaneous Ar. 3 - 3 1.1 11 3 14  4.4  Total Ar. 3 1 4  1.4 14 4 18  5.7    Respiratory Summary  DIAGNOSTIC By Sleep Stage By Body Position Total   NREM REM Supine Non-Supine   Time (min) 144.0 24.5 - 168.5 168.5         Obstructive Apnea - - - - -  Mixed Apnea - - - - -  Central Apnea - - - - -  Total Apneas - - - - -  Total Apnea Index -  - - - -         Hypopneas (>=3% Desat and/or Ar.) 22 28 - 50 50  AHI (>=3% Desat and/or Ar.) 9.2 68.6 - 17.8 17.8         Hypopneas (>=4% Desat) 7 18 - 25 25  AHI (>=4% Desat) 2.9 44.1 - 8.9 8.9          RERAs - - - - -  RERA Index - - - - -         RDI 9.2 68.6 - 17.8 17.8    TREATMENT By Sleep Stage By Body Position Total   NREM REM Supine Non-Supine   Time (min) 152.5 38.0 - 190.5 190.5         Obstructive Apnea - - - - -  Mixed Apnea - - - - -  Central Apnea - - - - -  Total Apneas - - - - -  Total Apnea Index - - - - -         Hypopneas (>=3% Desat and/or Ar.) 23 13 - 36 36  AHI (>=3% Desat and/or Ar.) 9.0 20.5 - 11.3 11.3         Hypopneas (>=4% Desat) 5 1 - 6 6  AHI (>=4% Desat) 2.0 1.6 - 1.9 1.9          RERAs 3 - - 3 3  RERA Index 1.2 - - 0.9 0.9         RDI 10.2 20.5 - 12.3 12.3    Respiratory Event Durations   DIAGNOSTIC TREATMENT  Apnea NREM REM NREM REM  Average (seconds) - - - -  Maximum (seconds) - - - -  Hypopnea      Average (seconds) 18.3 17.2 19.0 17.1  Maximum (seconds) 23.0 25.2 23.8 21.2    Limb Movement Summary   DIAGNOSTIC TREATMENT   Count Index Count Index  Isolated Limb Movements - - - -  Periodic Limb Movements (PLMs) - - - -  Total Limb Movements - - - -    Oxygen Saturation Summary   DIAGNOSTIC TREATMENT   Wake NREM REM TST Wake NREM REM TST  Average SpO2 93.7% 92.4% 91.6% 92.2% 94.7% 94.0% 93.4% 93.9%  Minimum SpO2 89.0% 89.0%  84.0% 84.0%  91.0% 91.0% 91.0% 91.0%   Maximum SpO2 98.0% 98.0% 98.0% 98.0%  100.0% 98.0% 98.0% 98.0%    DIAGNOSTIC Oxygen Saturation Distribution  Range (%) Time in range (min) Time in range (%)   90.0 - 100.0 174.9 95.2%  80.0 - 90.0 8.9 4.8%  70.0 - 80.0 - -  60.0 - 70.0 - -  50.0 - 60.0 - -  0.0 - 50.0 - -  Time Spent <=88% SpO2  Range (%) Time in range (min) Time in range (%)  0.0 - 88.0 1.3 0.7%      Count Index  Desaturations: 50 17.8   TREATMENT Oxygen Saturation  Distribution  Range (%) Time in range (min) Time in range (%)   90.0 - 100.0 216.1 100.0%  80.0 - 90.0 - -  70.0 - 80.0 - -  60.0 - 70.0 - -  50.0 - 60.0 - -  0.0 - 50.0 - -  Time Spent <=88% SpO2  Range (%) Time in range (min) Time in range (%)  0.0 - 88.0 - -      Count Index  Desaturations: 37 11.7     Cardiac Summary   DIAGNOSTIC TREATMENT   Wake NREM REM Total Wake NREM REM Total  Average Pulse Rate (BPM) 82.1 80.7 79.1 80.6 80.7 75.6 74.8 76.1  Minimum Pulse Rate (BPM) 72.0 64.0 70.0 64.0 68.0 64.0 66.0 64.0  Maximum Pulse Rate (BPM) 113.0 97.0 97.0 113.0 97.0 96.0 87.0 97.0   Pulse Rate Distribution   DIAGNOSTIC  Range (bpm) Time in range (min) Time in range (%)  0.0 - 40.0 - -  40.0 - 60.0 - -  60.0 - 80.0 87.4 45.8%  80.0 - 100.0 103.2 54.0%  100.0 - 120.0 0.4 0.2%  120.0 - 140.0 - -  140.0 - 200.0 - -   TREATMENT  Range (bpm) Time in range (min) Time in range (%)  0.0 - 40.0 - -  40.0 - 60.0 - -  60.0 - 80.0 177.1 81.9%  80.0 - 100.0 39.1 18.1%  100.0 - 120.0 - -  120.0 - 140.0 - -  140.0 - 200.0 - -    Titration Summary  PAP Device PAP Level O2 Level Time (min) Wake (min) NREM (min) REM (min) Supine TST (min) Sleep Eff% OA# CA# MA# Hyp# (>=3%) AHI (>=3%) Hyp# (>=4%) AHI (>=%4) RERA RDI SpO2 <=88% (min) Min SpO2 Mean SpO2 Ar. Index  - Off - 191.5 23.0 144.0 24.5  88.0% - - - 50 17.8 25  8.9 -  17.8  1.3 84.0 92.2 1.4  CPAP 5 - 13.5 4.0 9.5 0.0  70.4% - - - 1 6.3 -  - -  6.3  0.0 92.0 94.1 -  CPAP 6 - 1.5 0.0 1.5 0.0  100.0% - - - - - -  - -  -  0.0 93.0 94.1 -  CPAP 7 - 10.0 0.0 10.0 0.0  100.0% - - - 2 12.0 -  - -  12.0  0.0 92.0 93.9 -  CPAP 8 - 26.0 0.0 26.0 0.0  100.0% - - - - - -  - -  -  0.0 91.0 93.8 -  CPAP 9 - 5.5 0.0 2.0 3.5  100.0% - - - 4 43.6 -  - -  43.6  0.0 93.0 95.4 -  CPAP 10 - 10.0 2.0 1.5 6.5  80.0% - - - 2 15.0 -  - -  15.0  0.0 91.0 93.2  7.5  CPAP 11 - 76.5 17.5 46.0 13.0  77.1% - - - 11 11.2 3  3.1 3  14.2  0.0 91.0 93.9  5.1  CPAP 12 - 28.0 0.0 22.0 6.0  100.0% - - - 5 10.7 2  4.3 -  10.7  0.0 91.0 93.3 4.3  CPAP 13 - 45.5 2.5 34.0 9.0  94.5% - - - 11 15.3 1  1.4 -  15.3  0.0 92.0 94.1 14.0    Hypnograms                           Technologist Comments  The patient is a 63 y/o female in the [facility] for a Split-Night order. The patient's associated diagnosis is snoring. The study was performed in Sleep Room #4.  The patient arrived with no complaints. She was explained the split night study process and fitted for a mask prior to starting the study.  Best fit and comfort mask here in the lab was the ResMed P30I nasal pillows mask. Size: Medium Pillow. This mask was used for the study.   The patient demonstrated moderate to loud snoring, hypopneas and RERAs. Hypopneas were more frequent in stage REM sleep. The patient met Split Night criteria and CPAP was applied at 5 cm/H2O with heated humidity. Pressures were increased for snoring and events. Patient was observed sleeping supine, lateral L and R. The patient tolerated CPAP therapy very well.  Final pressure on CPAP was 13 cm/H2O, EPR: 1  No restroom visits were taken during the study. ECG appeared to be Sinus Rhythm Snore was moderate to loud before split and on low starting CPAP pressure. PLMs/PLMAs were rare No parasomnias observed.

## 2024-09-16 NOTE — Procedures (Signed)
 Indications for Polysomnography The patient is a 63 year old Female who is 5' 4 and weighs 268.0 lbs.  Her BMI equals 46.3.  A diagnostic polysomnogram was performed to evaluate for -.  After 168.5 minutes of sleep time the patient exhibited sufficient respiratory events qualifying  her for a CPAP trial which was then initiated.  MedicationNone Taken Polysomnogram Data A full night polysomnogram was performed recording the standard physiologic parameters including EEG, EOG, EMG, EKG, nasal and oral airflow.  Respiratory parameters of chest and abdominal movements are recorded with Peizo-Crystal motion transducers.   Oxygen saturation was recorded by pulse oximetry.  Sleep Architecture The total recording time of the diagnostic portion of the study was 191.0 minutes.  The total sleep time was 168.5 minutes.  During the diagnostic portion of the study, the patient spent 3.0% of total sleep time in Stage N1, 54.6% in Stage N2, 27.9% in  Stages N3, and 14.5% in REM.   Sleep latency was 8.5 minutes.  REM latency was 62.5 minutes.  Sleep Efficiency was 88.2%.  Wake after Sleep Onset time was 14.0 minutes.  At 01:42:19 AM the patient was placed on PAP treatment and was titrated at pressures ranging from 5* cm/H20 with supplemental oxygen at - up to 13* cm/H20 with supplemental oxygen at -.  The total recording time of the treatment portion of the study was  216.4 minutes.  The total sleep time was 190.5 minutes.  During the treatment portion of the study, the patient spent 4.5% of total sleep time in Stage N1, 60.4% in Stage N2, 15.2% in Stages N3, and 19.9% in REM.   Sleep latency was 4.0 minutes.  REM  latency was 49.0 minutes.  Sleep Efficiency was 88.0%.  Wake after Sleep Onset time was 22.0 minutes.  Respiratory Events During the diagnostic portion of the study, the polysomnogram revealed a presence of - obstructive, - central, and - mixed apneas resulting in an Apnea index of - events per hour.   There were 50 hypopneas (GreaterEqual to3% desaturation and/or arousal)  resulting in an Apnea\Hypopnea Index (AHI GreaterEqual to3% desaturation and/or arousal) of 17.8 events per hour.  There were 25 hypopneas (GreaterEqual to4% desaturation) resulting in an Apnea\Hypopnea Index (AHI GreaterEqual to4% desaturation) of 8.9  events per hour.  There were - Respiratory Effort Related Arousals resulting in a RERA index of - events per hour. The Respiratory Disturbance Index is 17.8 events per hour.  The snore index was 179.1 events per hour.  Mean oxygen saturation was 92.4%.   The lowest oxygen saturation during sleep was 84.0%.  Time spent LessEqual to88% oxygen saturation was  minutes ().  During the treatment portion of the study, the polysomnogram revealed a presence of - obstructive, - central, and - mixed apneas resulting in an Apnea index of - events per hour.  There were 36 hypopneas (GreaterEqual to3% desaturation and/or arousal)  resulting in an Apnea\Hypopnea Index (AHI GreaterEqual to3% desaturation and/or arousal) of 11.3 events per hour.  There were 6 hypopneas (GreaterEqual to4% desaturation) resulting in an Apnea\Hypopnea Index (AHI GreaterEqual to4% desaturation) of 1.9  events per hour.  There were 3 Respiratory Effort Related Arousals resulting in a RERA index of 0.9 events per hour. The Respiratory Disturbance Index is 12.3 events per hour.  The snore index was 130.1 events per hour.  Mean oxygen saturation was 94.0%.   The lowest oxygen saturation during sleep was 91.0%.  Time spent LessEqual to88% oxygen saturation was  minutes ().  Limb Activity During the diagnostic portion of the study, there were - limb movements recorded.  Of this total, - were classified as PLMs.  Of the PLMs, - were associated with arousals.  The Limb Movement index was - per hour while the PLM index was - per hour.  During the treatment portion of the study, there were - limb movements recorded.  Of this  total, - were classified as PLMs.  Of the PLMs, - were associated with arousals.  The Limb Movement index was - per hour while the PLM index was - per hour.  Cardiac Summary During the diagnostic portion of the study, the average pulse rate was 80.6 bpm.  The minimum pulse rate was 64.0 bpm while the maximum pulse rate was 113.0 bpm.  During the treatment portion of the study, the average pulse rate was 76.1 bpm.  The minimum pulse rate was 64.0 bpm while the maximum pulse rate was 97.0 bpm.  Comments: Patient with moderate obstructive sleep apnea, patient was successfully titrated to CPAP therapy  Diagnosis: Moderate obstructive sleep apnea with mild oxygen desaturations. Started on CPAP therapy, CPAP titrated to a maximum pressure of 13 which was well-tolerated. Excellent sleep efficiency No significant periodic limb movement Cardiac rhythm was sinus  Recommendations: CPAP of 13 with an EPR of 1 with heated humidification with patient's mask of choice Patient used a medium sized ResMed P 30I nasal pillow mask Encourage weight loss measures Clinical follow-up for optimization of treatment  This study was personally reviewed and electronically signed by: Jennet Epley, MD Accredited Board Certified in Sleep Medicine Date/Time:  09/16/24

## 2024-09-21 ENCOUNTER — Telehealth (HOSPITAL_COMMUNITY)

## 2024-09-21 ENCOUNTER — Encounter (HOSPITAL_COMMUNITY): Payer: Self-pay

## 2024-09-21 DIAGNOSIS — F319 Bipolar disorder, unspecified: Secondary | ICD-10-CM | POA: Diagnosis not present

## 2024-09-21 MED ORDER — ARIPIPRAZOLE LAUROXIL ER 441 MG/1.6ML IM PRSY
441.0000 mg | PREFILLED_SYRINGE | INTRAMUSCULAR | Status: AC
  Administered 2024-12-09 – 2025-01-07 (×2): 441 mg via INTRAMUSCULAR

## 2024-09-21 MED ORDER — ARISTADA 441 MG/1.6ML IM PRSY: 441.0000 mg | PREFILLED_SYRINGE | INTRAMUSCULAR | 11 refills | Status: DC

## 2024-09-21 NOTE — Telephone Encounter (Signed)
 Called patient and informed of results,placed order

## 2024-09-21 NOTE — Addendum Note (Signed)
 Addended by: CARVIN CROCK on: 09/21/2024 02:57 PM   Modules accepted: Level of Service

## 2024-10-12 ENCOUNTER — Encounter: Payer: Self-pay | Admitting: Radiology

## 2024-10-13 ENCOUNTER — Ambulatory Visit (HOSPITAL_BASED_OUTPATIENT_CLINIC_OR_DEPARTMENT_OTHER)

## 2024-10-13 VITALS — BP 151/96 | HR 84 | Ht 64.5 in | Wt 264.6 lb

## 2024-10-13 DIAGNOSIS — F319 Bipolar disorder, unspecified: Secondary | ICD-10-CM | POA: Diagnosis not present

## 2024-10-14 ENCOUNTER — Other Ambulatory Visit (HOSPITAL_COMMUNITY): Payer: Self-pay

## 2024-10-14 MED ORDER — ARIPIPRAZOLE ER 400 MG IM PRSY
400.0000 mg | PREFILLED_SYRINGE | Freq: Once | INTRAMUSCULAR | Status: AC
  Administered 2024-10-13: 400 mg via INTRAMUSCULAR

## 2024-10-14 NOTE — Progress Notes (Signed)
 [name]                                          MRN: [id number]   10/14/2024   The [facility] Quality Team Specialist reviewed this patient medical record for the purposes of chart review for care gap closure. The following were reviewed: abstraction for care gap closure-controlling blood pressure.    [facility]BCI Quality Team

## 2024-10-14 NOTE — Progress Notes (Signed)
 Patient arrived today for her due injection of Abilify  Maintena 400 mg. Patient is well groomed with an appropriate affect. Patient denies any SI/HI or AVH. Injection was prepared as ordered and administered in patients RD. Patient tolerated well and without complaint.      NDC: 40851-927-19 LOT: [id number] EXP: SEP 2027

## 2024-10-15 DIAGNOSIS — R400 Somnolence: Secondary | ICD-10-CM | POA: Diagnosis not present

## 2024-10-15 DIAGNOSIS — G4733 Obstructive sleep apnea (adult) (pediatric): Secondary | ICD-10-CM | POA: Diagnosis not present

## 2024-10-20 ENCOUNTER — Ambulatory Visit (INDEPENDENT_AMBULATORY_CARE_PROVIDER_SITE_OTHER): Admitting: Pulmonary Disease

## 2024-10-20 ENCOUNTER — Encounter: Payer: Self-pay | Admitting: Pulmonary Disease

## 2024-10-20 DIAGNOSIS — R0602 Shortness of breath: Secondary | ICD-10-CM | POA: Diagnosis not present

## 2024-10-20 DIAGNOSIS — Z6841 Body Mass Index (BMI) 40.0 and over, adult: Secondary | ICD-10-CM | POA: Diagnosis not present

## 2024-10-20 DIAGNOSIS — G4733 Obstructive sleep apnea (adult) (pediatric): Secondary | ICD-10-CM

## 2024-10-20 NOTE — Patient Instructions (Addendum)
 Start using your CPAP, it does take a little bit of time to get used to it  We will place a referral to our pharmacy for initiation of Mounjaro   I will see you back in about 6 to 8 weeks  Call us  with significant concerns

## 2024-10-20 NOTE — Progress Notes (Signed)
 [name] [facility]    [id number]    [birth year]  Primary Care Physician:Ngetich, Roxan BROCKS, NP  Referring Physician: Leonarda Roxan BROCKS, NP [address] [place],  [place] [address]  Chief complaint:   Patient being seen for concern for sleep disordered breathing In for follow-up  HPI:  She recently had a study showing moderate obstructive sleep apnea titrated to CPAP  She just received her machine  In for follow-up today  Has been doing really well  She is committed to using the CPAP  She did feel better during the night of her study, felt rested in the morning, she feels she will tolerate the machine  We did talk about weight loss medications She had recently tried to have Ozempic  approved and was denied now  She does have class III obesity, history of obstructive sleep apnea Mounjaro  is approved for sleep apnea with obesity Patient would like to be started on Mounjaro   Denies a history of thyroid cancer, no family history of multiple endocrine neoplasia  No history of pancreatitis  Sleep habits have remained about the same Nonrestrictive sleep, choking respirations at night, some sleepiness during the day  Reformed smoker quit in 1991, about half a pack a day smoker  Both parents have sleep apnea Weight gain in the last 3 to 6 months up about 30 pounds  Shortness of breath with mild to moderate exertion sometimes just walking the dog gets short of breath  Usually goes to bed between 10 and 1030 Takes about an hour to fall asleep 2-4 awakenings Final wake up time about 7 AM  Quit smoking in 2011  She has a previous history of pulmonary embolism in 2001  Outpatient Encounter Medications as of 10/20/2024  Medication Sig   albuterol  (PROVENTIL  HFA;VENTOLIN  HFA) 108 (90 BASE) MCG/ACT inhaler Inhale 2 puffs into the lungs every 6 (six) hours as needed. sob    amLODipine  (NORVASC ) 2.5 MG tablet Take 1 tablet by mouth once daily   ARIPiprazole  Lauroxil  ER (ARISTADA) 441 MG/1.6ML prefilled syringe Inject 441 mg into the muscle every 28 (twenty-eight) days.   aspirin  EC 81 MG tablet Take 1 tablet (81 mg total) by mouth daily. Swallow whole.   atorvastatin  (LIPITOR) 10 MG tablet Take 1 tablet (10 mg total) by mouth daily.   methocarbamol  (ROBAXIN -750) 750 MG tablet Take 1 tablet (750 mg total) by mouth 3 (three) times daily as needed for muscle spasms.   ondansetron  (ZOFRAN -ODT) 8 MG disintegrating tablet Take 1 tablet (8 mg total) by mouth every 8 (eight) hours as needed for nausea or vomiting.   oxyCODONE -acetaminophen  (PERCOCET/ROXICET) 5-325 MG tablet Take 1 tablet by mouth every 6 (six) hours as needed for severe pain (pain score 7-10).   spironolactone  (ALDACTONE ) 25 MG tablet Take 1 tablet (25 mg total) by mouth daily.   traMADol  (ULTRAM ) 50 MG tablet Take 1 tablet (50 mg total) by mouth every 12 (twelve) hours as needed.   Facility-Administered Encounter Medications as of 10/20/2024  Medication   ARIPiprazole  Lauroxil ER (ARISTADA) 441 MG/1.6ML prefilled syringe 441 mg    Allergies as of 10/20/2024 - Review Complete 10/20/2024  Allergen Reaction Noted   Gabapentin Swelling 11/07/2011   Peanut-containing drug products Anaphylaxis 02/27/2024   Shellfish allergy Hives and Other (See Comments) 06/26/2012   Codeine Other (See Comments) 08/22/2010   Lamotrigine Other (See Comments) 08/22/2010   Topiramate Other (See Comments) 12/19/2010    Past Medical History:  Diagnosis Date   Anginal pain    11/2011   Asthma    Bipolar 1 disorder (HCC)    Bipolar affective disorder, depressed, mild (HCC)    Remote history of suicidal attempts   Breast cancer (HCC)    In 2005 treated with surgery and Arimidex for 5 years   Depression    Headache(784.0)    h/o migraines    History of pulmonary embolism    Bilateral in 2001   HNP (herniated nucleus pulposus)    2000- L4-5, used pain mgt. prog. in [place]    Hypertension    Personal history of  chemotherapy    Personal history of radiation therapy    Shortness of breath    in the past    Past Surgical History:  Procedure Laterality Date   ABDOMINAL HYSTERECTOMY     BREAST BIOPSY Left 12/2020   BREAST LUMPECTOMY Right    2005   MASTOPEXY  02/07/2012   Procedure: MASTOPEXY;  Surgeon: Estefana Reichert, DO;  Location: [facility];  Service: Plastics;  Laterality: Left;  left breast mastopexy/reduction for symmetry after right breast cancer   ORIF ANKLE FRACTURE  06/26/2012   Procedure: OPEN REDUCTION INTERNAL FIXATION (ORIF) ANKLE FRACTURE;  Surgeon: Cordella Glendia Hutchinson, MD;  Location: [facility] OR;  Service: Orthopedics;  Laterality: Right;   REDUCTION MAMMAPLASTY Left    TUBAL LIGATION      Family History  Problem Relation Age of Onset   Breast cancer Mother 67   Prostate cancer Father 1   Heart attack Brother    Diabetes Maternal Aunt    Cancer Paternal Aunt    Parkinson's disease Maternal Grandmother    Breast cancer Paternal Grandmother 25 - 78   Dementia Paternal Grandmother    Breast cancer Cousin 90 - 40   Breast cancer Other 66 - 78       maternal great aunt   Colon cancer Neg Hx    Rectal cancer Neg Hx    Stomach cancer Neg Hx     Social History   Socioeconomic History   Marital status: Married    Spouse name: Not on file   Number of children: Not on file   Years of education: Not on file   Highest education level: Not on file  Occupational History   Not on file  Tobacco Use   Smoking status: Former    Current packs/day: 0.00    Types: Cigarettes    Quit date: 06/09/2010    Years since quitting: 14.3   Smokeless tobacco: Never  Vaping Use   Vaping status: Never Used  Substance and Sexual Activity   Alcohol use: Not Currently    Comment: 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 2      Pets: No      Highest level or education completed: Law school      Current/Past profession:  Librarian, [employer]       Exercise: No                 Type and how often:          Living Will: Yes   DNR: Yes   POA/HPOA: Yes      Functional Status:   Do you have difficulty bathing or dressing yourself? No   Do you have difficulty preparing food or eating? No   Do you have difficulty managing your medications? No   Do you have difficulty managing your finances? No   Do you have difficulty affording your medications? No         Update 06/17/2024   Caffeine: 1-2 cups per day   Right handed   Social Drivers of Health   Financial Resource Strain: Medium Risk (11/06/2019)   Received from [facility]   Overall Financial Resource Strain (CARDIA)    Difficulty of Paying Living Expenses: Somewhat hard  Food Insecurity: No Food Insecurity (11/06/2019)   Received from [facility]   Hunger Vital Sign    Within the past 12 months, you worried that your food would run out before you got the money to buy more.: Never true    Within the past 12 months, the food you bought just didn't last and you didn't have money to get more.: Never true  Transportation Needs: No Transportation Needs (11/06/2019)   Received from [facility] - Transportation    Lack of Transportation (Medical): No    Lack of Transportation (Non-Medical): No  Physical Activity: Inactive (03/25/2019)   Received from [facility]   Exercise Vital Sign    On average, how many days per week do you engage in moderate to strenuous exercise (like a brisk walk)?: 0 days    On  average, how many minutes do you engage in exercise at this level?: 0 min  Stress: Stress Concern Present (03/25/2019)   Received from [facility] - Occupational Stress Questionnaire    Feeling of Stress : Very much  Social Connections: Unknown (06/18/2023)   Received from [facility]   Social Network    Social Network: Not on file  Intimate Partner Violence: Unknown (06/18/2023)   Received from [facility]   HITS    Physically Hurt: Not on file    Insult or Talk Down To: Not on file    Threaten Physical Harm: Not on file    Scream or Curse: Not on file    Review of Systems  Constitutional:  Positive for fatigue.  Respiratory:  Positive for apnea and shortness of breath.   Cardiovascular:  Positive for leg swelling.  Psychiatric/Behavioral:  Positive for sleep disturbance.     Vitals:   10/20/24 1603  BP: 131/87  Pulse: 91  SpO2: 96%     Physical Exam Constitutional:      Appearance: She is obese.  HENT:     Head: Normocephalic.     Mouth/Throat:     Mouth: Mucous membranes are moist.  Eyes:     General: No scleral icterus.    Pupils: Pupils are equal, round, and reactive to light.  Cardiovascular:     Rate and Rhythm: Normal rate and regular rhythm.     Heart sounds: No murmur heard.    No friction rub.  Pulmonary:     Effort: No respiratory distress.     Breath  sounds: No stridor. No wheezing or rhonchi.  Musculoskeletal:     Cervical back: No rigidity or tenderness.  Neurological:     Mental Status: She is alert.  Psychiatric:        Mood and Affect: Mood normal.    Data Reviewed: CT scan chest 05/29/2023 describes paraseptal emphysema, centrilobular emphysema  Echocardiogram reviewed with the patient showing normal ejection fraction, normal right-sided pressures  Pulmonary function test reviewed and is within normal limits  Sleep study reviewed showing severe obstructive sleep apnea titrated CPAP of 13  with an EPR of 1  Assessment:   Patient with moderate obstructive sleep apnea - Will be starting CPAP therapy - Just received the machine  Shortness of breath - PFT within normal limits - Echocardiogram within normal limits - Encouraged to continue using albuterol  as needed  Obesity with obstructive sleep apnea - Patient will benefit from Mounjaro  - Has no history of thyroid cancer, no family history of multiple endocrine neoplasia, no his personal history of pancreatitis  She tolerated CPAP therapy during a titration study  Plan/Recommendations: Encouraged to start using her CPAP on a nightly basis  Continue to work on weight loss efforts  Will send a pharmacy referral for initiation of Tirzepatide , I believe patient will benefit from it she is committed to try to get her weight down  Follow-up in about 6 to 8 weeks  Encouraged to call with significant concerns  I spent 32 minutes Pre-charting, reviewing records, interviewing/examining patient, and managing orders.  Jennet Epley MD [facility] 10/20/2024, 4:19 PM  CC: Ngetich, Dinah C, NP

## 2024-10-23 ENCOUNTER — Telehealth: Payer: Self-pay

## 2024-10-23 NOTE — Telephone Encounter (Signed)
 Received referral for new start Mounjaro  -  Mounjaro  initialization for OSA and Obesity Class 3   From Dr. Neda. Routing to appropriate pharmacy team.

## 2024-10-26 ENCOUNTER — Other Ambulatory Visit (HOSPITAL_COMMUNITY): Payer: Self-pay

## 2024-10-30 NOTE — Telephone Encounter (Signed)
Dr. Olalere please advise 

## 2024-11-03 NOTE — Telephone Encounter (Signed)
 Yes please, submit PA for Zepbound 

## 2024-11-03 NOTE — Telephone Encounter (Signed)
 Routing to  PA team

## 2024-11-04 NOTE — Telephone Encounter (Signed)
 Routing to Dr Neda so he is aware and if he has any recommendations

## 2024-11-10 NOTE — Telephone Encounter (Signed)
 Can we place a referral to the weight loss clinic please

## 2024-11-11 ENCOUNTER — Ambulatory Visit (HOSPITAL_COMMUNITY)

## 2024-11-11 ENCOUNTER — Other Ambulatory Visit (HOSPITAL_COMMUNITY): Payer: Self-pay

## 2024-11-11 VITALS — BP 138/83 | HR 80 | Ht 64.0 in | Wt 263.0 lb

## 2024-11-11 DIAGNOSIS — F319 Bipolar disorder, unspecified: Secondary | ICD-10-CM

## 2024-11-11 MED ORDER — ARIPIPRAZOLE ER 400 MG IM PRSY
400.0000 mg | PREFILLED_SYRINGE | Freq: Once | INTRAMUSCULAR | Status: AC
  Administered 2024-11-11: 400 mg via INTRAMUSCULAR

## 2024-11-11 MED ORDER — ARISTADA 441 MG/1.6ML IM PRSY: 441.0000 mg | PREFILLED_SYRINGE | INTRAMUSCULAR | 11 refills | Status: AC

## 2024-11-11 NOTE — Progress Notes (Signed)
 Patient arrived this morning for her due injection of Abilify  Maintena 400 mg. Patient presents well groomed with an appropriate affect. Patient just returned from [place], where they had a surprise party for her mother. Patient denies SI/HI or AVH. Injection was prepared as ordered and administered in the LD. Patient tolerated well and without complaint and will return in 28 days   NDC: 59148-072-80 LOT: [id number] EXP: DEC 2027

## 2024-11-23 NOTE — Telephone Encounter (Signed)
 Referral was placed

## 2024-11-23 NOTE — Addendum Note (Signed)
 Addended by: Demarrion Meiklejohn M on: 11/23/2024 03:11 PM   Modules accepted: Orders

## 2024-11-24 ENCOUNTER — Encounter (INDEPENDENT_AMBULATORY_CARE_PROVIDER_SITE_OTHER): Payer: Self-pay

## 2024-12-08 NOTE — Progress Notes (Signed)
 BH MD Outpatient Progress Note  12/14/2024 10:56 AM [name]  MRN:  [id number]  Assessment:  Junko Ohagan [facility] presents for follow-up evaluation.    Identifying Information: [name] is a 63 y.o. y.o. female with a history of bipolar affective disorder and alcohol use disorder, in sustained remission who is an established patient with [facility] for management of bipolar affective disorder.  Patient has continued to be at her baseline, has had no exacerbation of symptoms or any new hospital visits lately.  She has been compliant with her Abilify  injectables, has had no side effects or any new physical concerns.  She is not actively passively homicidal or suicidal.  She has been eating and sleeping well, has not used her CPAP and will follow-up with her pulmonologist to fix it.  She is enrolled in [employer], denies any issues at school.  Her blood pressure was slightly elevated today, she will follow-up with her primary care provider to restart her medications however she is monitoring her blood pressure regularly at home.  She is not using any substances including alcohol or cigarettes, has been 22 years sober.  She was concerned about the gambling addiction, discussed about using alternate coping skills, inclining the thrill associated to something that she likes, hobby.  She was amenable to the plan.  Plan to continue her on the same medication regimen due to therapeutic benefit and will follow-up with her in 12 weeks.  Plan:  # Bipolar affective disorder 1, history of numerous psychiatric hospitalizations, currently in partial remission Interventions: - Continue Aristada  441 mg every 28 days to target symptoms that prevail in the last week.  Last received 11/29/2024 - Patient undergoing therapy as outpatient, encouraged to keep up with her appointments --Encouraged patient to get her blood work done before her next appointment   #  Inattention, self-reported diagnosis of ADHD Interventions: - Patient has OSA, not using CPAP, will follow-up with her pulmonologist  Patient was given contact information for behavioral health clinic and was instructed to call 911 for emergencies.   Subjective:  Chief Complaint:  Chief Complaint  Patient presents with   Follow-up   Medication Refill    Interval History:  Today, patient reported that she has been doing well.  She denied any active or passive SI/HI/AVH.  Reported that she had mild of depression at the end of November, could not specify any reasons, reported that it lasted for about 2 weeks however I made a gratitude list, attended more AA meetings, spoke to her husband , improved her depression.  She denied feeling anxious stated that it is , reported her sleep as okay , but stated that she has OSA and has not been able to use her CPAP machine I sleep better without CPAP due to it being around the nose , encouraged her to speak with her sleep doctor, reported that she has an upcoming appointment with the pulmonologist and would bring up this with him.  Reported that she has not been on any weight loss medications however they might put me back on it .  She reported good appetite, denied any physical concerns or side effects of medications. Reported that she has glasses scheduled next week for [facility], denied any concerns they are.  She denied using any substances including alcohol or cigarettes.  Reported that she has been 22 years sober. We discussed about continuing Abilify  every 28 days.  Her blood pressure was slightly elevated, she has not been  taking her blood pressure medications, encouraged her to follow-up with her primary care provider.  She reported that she monitors her blood pressures at home and it is likely normal however it was elevated due to I am a little under the weather today .  Plan to follow-up with her in 12 weeks.  Visit Diagnosis:     ICD-10-CM   1. Bipolar 1 disorder (HCC)  F31.9          Past Psychiatric History:  From initial evaluation: 63 y.o. female with a history of bipolar affective disorder 1 who presents in person to [facility] for initial evaluation of medication management for bipolar disorder.  Patient reports being diagnosed with manic depressive illness at the age of 68, and having approximately 20 psychiatric hospitalizations until achieving sobriety from alcohol when she was approximately 63 years old.  Since becoming sober, the patient reports having no psychiatric hospitalizations until she decided, in conjunction with her psychiatrist, to taper off of Abilify .  She had a depressive relapse and was admitted to a psychiatric hospital near her home in [place] .  She was stabilized on Abilify  and given a long-acting injectable of the medication.  She states that she had been doing well since that time and recently moved to [place]  because of work obligations.  Unfortunately, she became nonadherent to the injection as a result of having no provider in [place] .   Past Medical History:  Past Medical History:  Diagnosis Date   Anginal pain    11/2011   Asthma    Bipolar 1 disorder (HCC)    Bipolar affective disorder, depressed, mild (HCC)    Remote history of suicidal attempts   Breast cancer (HCC)    In 2005 treated with surgery and Arimidex for 5 years   Depression    Headache(784.0)    h/o migraines    History of pulmonary embolism    Bilateral in 2001   HNP (herniated nucleus pulposus)    2000- L4-5, used pain mgt. prog. in [place]    Hypertension    Personal history of chemotherapy    Personal history of radiation therapy    Shortness of breath    in the past    Past Surgical History:  Procedure Laterality Date   ABDOMINAL HYSTERECTOMY     BREAST BIOPSY Left 12/2020   BREAST LUMPECTOMY Right    2005   MASTOPEXY  02/07/2012   Procedure: MASTOPEXY;  Surgeon:  Estefana Reichert, DO;  Location: [facility];  Service: Plastics;  Laterality: Left;  left breast mastopexy/reduction for symmetry after right breast cancer   ORIF ANKLE FRACTURE  06/26/2012   Procedure: OPEN REDUCTION INTERNAL FIXATION (ORIF) ANKLE FRACTURE;  Surgeon: Cordella Glendia Hutchinson, MD;  Location: [facility] OR;  Service: Orthopedics;  Laterality: Right;   REDUCTION MAMMAPLASTY Left    TUBAL LIGATION      Family Psychiatric History: None pertinent  Family History:  Family History  Problem Relation Age of Onset   Breast cancer Mother 36   Prostate cancer Father 67   Heart attack Brother    Diabetes Maternal Aunt    Cancer Paternal Aunt    Parkinson's disease Maternal Grandmother    Breast cancer Paternal Grandmother 45 - 80   Dementia Paternal Grandmother    Breast cancer Cousin 12 - 63   Breast cancer Other 45 - 61       maternal great aunt   Colon cancer Neg Hx  Rectal cancer Neg Hx    Stomach cancer Neg Hx     Social History:  Social History   Socioeconomic History   Marital status: Married    Spouse name: Not on file   Number of children: Not on file   Years of education: Not on file   Highest education level: Not on file  Occupational History   Not on file  Tobacco Use   Smoking status: Former    Current packs/day: 0.00    Types: Cigarettes    Quit date: 06/09/2010    Years since quitting: 14.5   Smokeless tobacco: Never  Vaping Use   Vaping status: Never Used  Substance and Sexual Activity   Alcohol use: Not Currently    Comment: 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  Other Topics Concern   Not on file  Social History Narrative   Diet: Normal      Caffeine: Yes      Married, if yes what year: Married,2022      Do you live in a house, apartment, assisted living, condo, trailer, ect: Apartment      Is it one or more  stories: one      How many persons live in your home? 2      Pets: No      Highest level or education completed: Law school      Current/Past profession: Librarian, Academic of [employer]       Exercise: No                 Type and how often:          Living Will: Yes   DNR: Yes   POA/HPOA: Yes      Functional Status:   Do you have difficulty bathing or dressing yourself? No   Do you have difficulty preparing food or eating? No   Do you have difficulty managing your medications? No   Do you have difficulty managing your finances? No   Do you have difficulty affording your medications? No         Update 06/17/2024   Caffeine: 1-2 cups per day   Right handed   Social Drivers of Health   Tobacco Use: Medium Risk (10/20/2024)   Patient History    Smoking Tobacco Use: Former    Smokeless Tobacco Use: Never    Passive Exposure: Not on Actuary Strain: Not on file  Food Insecurity: Not on file  Transportation Needs: Not on file  Physical Activity: Not on file  Stress: Not on file  Social Connections: Not on file  Depression (PHQ2-9): Low Risk (09/21/2024)   Depression (PHQ2-9)    PHQ-2 Score: 0  Alcohol Screen: Not on file  Housing: Not on file  Utilities: Not on file  Health Literacy: Not on file    Allergies:  Allergies  Allergen Reactions   Gabapentin Swelling   Peanut-Containing Drug Products Anaphylaxis   Shellfish Allergy Hives and Other (See Comments)    Some shellfish- is able to eat shrimp but not mussels, clams, oysters- hives and lip swelling   Codeine Other (See Comments)    REACTION: lethargic   Lamotrigine Other (See Comments)    REACTION: lips swell   Topiramate Other (See Comments)    REACTION: hallucinations    Current Medications:  Current Outpatient Medications  Medication Sig Dispense Refill   albuterol  (PROVENTIL  HFA;VENTOLIN  HFA) 108 (90 BASE) MCG/ACT inhaler Inhale 2 puffs into the lungs  every 6 (six) hours as needed. sob      amLODipine  (NORVASC ) 2.5 MG tablet Take 1 tablet by mouth once daily 60 tablet 0   ARIPiprazole  Lauroxil ER (ARISTADA ) 441 MG/1.6ML prefilled syringe Inject 441 mg into the muscle every 28 (twenty-eight) days. 1.6 mL 11   aspirin  EC 81 MG tablet Take 1 tablet (81 mg total) by mouth daily. Swallow whole.     atorvastatin  (LIPITOR) 10 MG tablet Take 1 tablet (10 mg total) by mouth daily. 90 tablet 3   Current Facility-Administered Medications  Medication Dose Route Frequency Provider Last Rate Last Admin   ARIPiprazole  Lauroxil ER (ARISTADA ) 441 MG/1.6ML prefilled syringe 441 mg  441 mg Intramuscular Q28 days    441 mg at 12/09/24 0901     Objective:  Psychiatric Specialty Exam: Physical Exam Constitutional:      Appearance: the patient is not toxic-appearing.  Pulmonary:     Effort: Pulmonary effort is normal.  Neurological:     General: No focal deficit present.     Mental Status: the patient is alert and oriented to person, place, and time.   Review of Systems  Respiratory:  Negative for shortness of breath.   Cardiovascular:  Negative for chest pain.  Gastrointestinal:  Negative for abdominal pain, constipation, diarrhea, nausea and vomiting.  Neurological:  Negative for headaches.      BP (!) 150/94   Pulse 84   Ht 5' 5 (1.651 m)   Wt 268 lb (121.6 kg)   BMI 44.60 kg/m   General Appearance: Fairly Groomed  Eye Contact:  Good  Speech:  Clear and Coherent  Volume:  Normal  Mood:  Euthymic  Affect:  Congruent  Thought Process:  Coherent  Orientation:  Full (Time, Place, and Person)  Thought Content: Logical   Suicidal Thoughts:  No  Homicidal Thoughts:  No  Memory:  Immediate;   Good  Judgement:  fair  Insight:  fair  Psychomotor Activity:  Normal  Concentration:  Concentration: Good  Recall:  Good  Fund of Knowledge: Good  Language: Good  Akathisia:  No  Handed:    AIMS (if indicated): not done  Assets:   Communication Skills Desire for Improvement Financial Resources/Insurance Housing Leisure Time Physical Health  ADL's:  Intact  Cognition: WNL  Sleep:  Fair     Metabolic Disorder Labs: No results found for: HGBA1C, MPG No results found for: PROLACTIN Lab Results  Component Value Date   CHOL 282 (H) 06/12/2023   TRIG 112 06/12/2023   HDL 55 06/12/2023   CHOLHDL 5.1 (H) 06/12/2023   VLDL 26 11/14/2011   LDLCALC 202 (H) 06/12/2023   LDLCALC 82 11/14/2011   Lab Results  Component Value Date   TSH 1.21 06/12/2023   TSH 2.697 11/14/2011    Therapeutic Level Labs: Lab Results  Component Value Date   LITHIUM  0.2 (L) 09/17/2023   LITHIUM  0.27 (L) 11/13/2011   No results found for: VALPROATE No results found for: CBMZ  Screenings: GAD-7    Flowsheet Row Office Visit from 03/04/2024 in [facility]  Total GAD-7 Score 0   PHQ2-9    Flowsheet Row Video Visit from 09/21/2024 in [facility] Most recent reading at 09/21/2024  9:48 AM Office Visit from 06/17/2024 in [facility] Most recent reading at 06/17/2024 11:13 AM Office Visit from 06/17/2024 in [facility] Most recent reading at 06/17/2024  9:35 AM Office Visit from 04/30/2024 in [facility] Most recent reading at 04/30/2024  3:34 PM Office Visit from 03/04/2024 in [facility] Most recent reading at 03/04/2024  1:23 PM  PHQ-2 Total Score 0 0 0 0 0  PHQ-9 Total Score -- 4 -- 0 0   Flowsheet Row ED from 02/27/2024 in [facility] ED from 11/16/2023 in [facility] Emergency Department at [facility]  C-SSRS RISK CATEGORY No Risk No Risk    Collaboration of Care: none  A total of 30 minutes was spent involved in face to face clinical care, chart  review, documentation.   Taysen Bushart, MD 12/14/2024, 10:56 AM

## 2024-12-09 ENCOUNTER — Ambulatory Visit (HOSPITAL_COMMUNITY)

## 2024-12-09 VITALS — BP 138/84 | HR 78 | Ht 65.0 in | Wt 269.0 lb

## 2024-12-09 DIAGNOSIS — F319 Bipolar disorder, unspecified: Secondary | ICD-10-CM

## 2024-12-09 NOTE — Progress Notes (Unsigned)
 Patient arrived this morning for her due injection of Aristada  ARIPiprazole  lauroxil 441mg . Patient presents well groomed and with appropriate affect. Patient denies SI/HI/AVH. Injection was prepared as ordered, administered in the LD under the supervison of [facility], LPN. Patient tolerated well and without complaint.   NDC: [address]-96 LOT:2025-2010T EXP:[id number]

## 2024-12-14 ENCOUNTER — Ambulatory Visit (HOSPITAL_COMMUNITY)

## 2024-12-14 VITALS — BP 150/94 | HR 84 | Ht 65.0 in | Wt 268.0 lb

## 2024-12-14 DIAGNOSIS — F319 Bipolar disorder, unspecified: Secondary | ICD-10-CM

## 2024-12-16 ENCOUNTER — Ambulatory Visit: Payer: Self-pay

## 2024-12-16 NOTE — Telephone Encounter (Signed)
 FYI

## 2024-12-16 NOTE — Telephone Encounter (Signed)
 FYI Only or Action Required?: Action required by provider: update on patient condition. Home care  Patient was last seen in primary care on 06/17/2024 by Darlean Maus, NP.  Called Nurse Triage reporting Influenza.  Symptoms began several days ago.  Interventions attempted: OTC medications: dayquil, nyquil.  Symptoms are: unchanged.  Triage Disposition: Home Care  Patient/caregiver understands and will follow disposition?: Yes  [name] Flu like symptoms   Reason for Triage: flu-like symptoms, coughing, sneezing, congestion, bodyaches, sore throat, chest soreness from coughing    Reason for Disposition  [1] Probable mild influenza (no fever) or a common cold, with no complications AND [2] NOT HIGH RISK  Answer Assessment - Initial Assessment Questions Symptoms started Sunday afternoon, sore throat, nasal congestion, head congestion, cough. States some chest pain with coughing. Diarrhea. Denies any higher acuity symptoms    1. SYMPTOMS: What is your main symptom or concern? (e.g., cough, fever, shortness of breath, muscle aches)     Cough, body aches, nasal congestions 2. ONSET: When did the symptoms start?      Sunday 3. COUGH: Do you have a cough? If Yes, ask: How bad is the cough?       Yes mod 4. FEVER: Do you have a fever? If Yes, ask: What is your temperature, how was it measured, and when did it start?     denies 5. BREATHING DIFFICULTY: Are you having any difficulty breathing? (e.g., normal; shortness of breath, wheezing, unable to speak)      denies 6. BETTER-SAME-WORSE: Are you getting better, staying the same or getting worse compared to yesterday?  If getting worse, ask, In what way?     Felt better last night now back to the same 7. OTHER SYMPTOMS: Do you have any other symptoms?  (e.g., chills, fatigue, headache, loss of smell or taste, muscle pain, sore throat)     Headache, sore throat 8. INFLUENZA EXPOSURE: Was there any known  exposure to influenza (flu) before the symptoms began?      unknown 9. INFLUENZA SUSPECTED: Why do you think you have influenza? (e.g., positive flu self-test at home, symptoms after exposure).     symptoms 10. INFLUENZA VACCINE: Have you had the flu vaccine? If Yes, ask: When did you last get it?        11 . HIGH RISK FOR COMPLICATIONS: Do you have any chronic medical problems? (e.g., asthma, heart or lung disease, obesity, weak immune system)  Protocols used: Influenza (Flu) Suspected-A-AH

## 2024-12-17 NOTE — Telephone Encounter (Signed)
 Recommend office visit appointment to evaluate symptoms.

## 2024-12-25 ENCOUNTER — Ambulatory Visit: Payer: Self-pay

## 2024-12-25 ENCOUNTER — Ambulatory Visit: Admitting: Family

## 2024-12-25 ENCOUNTER — Encounter: Payer: Self-pay | Admitting: Family

## 2024-12-25 DIAGNOSIS — E785 Hyperlipidemia, unspecified: Secondary | ICD-10-CM | POA: Diagnosis not present

## 2024-12-25 DIAGNOSIS — I700 Atherosclerosis of aorta: Secondary | ICD-10-CM

## 2024-12-25 MED ORDER — ATORVASTATIN CALCIUM 10 MG PO TABS: 10.0000 mg | ORAL_TABLET | Freq: Every day | ORAL | 3 refills | Status: AC

## 2024-12-25 NOTE — Telephone Encounter (Signed)
 FYI Only or Action Required?: FYI only for provider: appointment scheduled on 12/25/24.  Patient was last seen in primary care on 06/17/2024 by Darlean Maus, NP.  Called Nurse Triage reporting Knee Pain.  Symptoms began couple weeks ago.  Interventions attempted: Prescription medications: oxycodone  and Ice/heat application.  Symptoms are: rapidly worsening.  Triage Disposition: See PCP When Office is Open (Within 3 Days)  Patient/caregiver understands and will follow disposition?: Yes          Message from [place] B sent at 12/25/2024  8:09 AM EST  Reason for Triage: left knee pain back extremely painful and swollen, front of knew swollen and cannot walk pain is in the part of the knee   Reason for Disposition  [1] MODERATE pain (e.g., interferes with normal activities, limping) AND [2] present > 3 days  Answer Assessment - Initial Assessment Questions 1. LOCATION and RADIATION: Where is the pain located?      Left knee.  2. QUALITY: What does the pain feel like?  (e.g., sharp, dull, aching, burning)     Strain, feels like the muscle doesn't want to be pliable, ache  3. SEVERITY: How bad is the pain? What does it keep you from doing?   (Scale 1-10; or mild, moderate, severe)     6/10.  4. ONSET: When did the pain start? Does it come and go, or is it there all the time?     Couple weeks ago; pain rapidly worsening since yesterday  5. RECURRENT: Have you had this pain before? If Yes, ask: When, and what happened then?     Yes, sporadic pain when going up or down stairs in the past.  6. SETTING: Has there been any recent work, exercise or other activity that involved that part of the body?      Tuesday, walked her dog and he was pulling. I could feel the strain on the back of my knee  7. AGGRAVATING FACTORS: What makes the knee pain worse? (e.g., walking, climbing stairs, running)     Going up and down stairs, standing from sitting.  8.  ASSOCIATED SYMPTOMS: Is there any swelling or redness of the knee?     Moderate swelling to back of left knee, mild swelling to front of knee.  9. OTHER SYMPTOMS: Do you have any other symptoms? (e.g., calf pain, chest pain, difficulty breathing, fever)     Pain radiated up to left thigh last night. No fever, SOB, chest pain, redness, warmth.  Protocols used: Knee Pain-A-AH

## 2024-12-25 NOTE — Progress Notes (Signed)
 "  Provider: Rosha Cocker FNP-C  Charice Zuno, Roxan BROCKS, NP  Patient Care Team: Elice Crigger, Roxan BROCKS, NP as PCP - General (Family Medicine)  Extended Emergency Contact Information Primary Emergency Contact: McDavid,Kenneth  [place] Mobile Phone: [phone] Relation: Spouse Preferred language: English Interpreter needed? No Secondary Emergency Contact: Kelly,Dale  [place] Mobile Phone: [phone] Relation: Father Preferred language: English Interpreter needed? No  Code Status: Full code Goals of care: Advanced Directive information    04/21/2024    5:34 PM  Advanced Directives  Does Patient Have a Medical Advance Directive? Yes  Type of Advance Directive Living will;Healthcare Power of Attorney  Does patient want to make changes to medical advance directive? No - Patient declined  Copy of Healthcare Power of Attorney in Chart? No - copy requested     Chief Complaint  Patient presents with   Knee Pain    Patient presents today for left knee pain and swelling.     History of Present Illness   [name] is a 64 year old female who presents with left knee pain and swelling.  She began experiencing discomfort in her left knee a few weeks ago, particularly when ascending and descending stairs. The pain was initially located in the front and back of the knee. The condition worsened significantly after walking her dog on Tuesday, an activity she usually avoids, as her husband was unwell. The dog pulled her, exacerbating the knee pain.  By Wednesday, the pain intensified, but she was still able to walk. By Thursday, the pain became severe, accompanied by extreme weakness when standing after sitting for more than five minutes. The knee is swollen, and she finds it difficult to bear weight on it. Walking provides slight relief, but the pain persists. She describes the pain as shooting from the knee up into her thigh, with the entire  leg feeling weak upon standing.  No redness around the knee, fever, chills, or numbness in her feet. The pain is localized to the left knee, primarily in the back, and radiates into the thigh.  For pain management, she took a leftover oxycodone  last night, which helped alleviate the pain, but she is hesitant to use it regularly. She has also used a heating pad and ice to manage the swelling.  She drove herself to the appointment despite the pain, using her right leg to drive, and mentions difficulty getting in and out of the car due to the knee pain.   Past Medical History:  Diagnosis Date   Anginal pain    11/2011   Asthma    Bipolar 1 disorder (HCC)    Bipolar affective disorder, depressed, mild (HCC)    Remote history of suicidal attempts   Breast cancer (HCC)    In 2005 treated with surgery and Arimidex for 5 years   Depression    Headache(784.0)    h/o migraines    History of pulmonary embolism    Bilateral in 2001   HNP (herniated nucleus pulposus)    2000- L4-5, used pain mgt. prog. in [place]    Hypertension    Personal history of chemotherapy    Personal history of radiation therapy    Shortness of breath    in the past   Sleep apnea    Past Surgical History:  Procedure Laterality Date   ABDOMINAL HYSTERECTOMY     BREAST BIOPSY Left 12/2020   BREAST LUMPECTOMY Right    2005   MASTOPEXY  02/07/2012   Procedure: MASTOPEXY;  Surgeon: Estefana Reichert, DO;  Location: [facility];  Service: Plastics;  Laterality: Left;  left breast mastopexy/reduction for symmetry after right breast cancer   ORIF ANKLE FRACTURE  06/26/2012   Procedure: OPEN REDUCTION INTERNAL FIXATION (ORIF) ANKLE FRACTURE;  Surgeon: Cordella Glendia Hutchinson, MD;  Location: [facility] OR;  Service: Orthopedics;  Laterality: Right;   REDUCTION MAMMAPLASTY Left    TUBAL LIGATION      Allergies[1]  Outpatient Encounter Medications as of 12/25/2024  Medication Sig   albuterol  (PROVENTIL  HFA;VENTOLIN  HFA) 108  (90 BASE) MCG/ACT inhaler Inhale 2 puffs into the lungs every 6 (six) hours as needed. sob    amLODipine  (NORVASC ) 2.5 MG tablet Take 1 tablet by mouth once daily   ARIPiprazole  Lauroxil ER (ARISTADA ) 441 MG/1.6ML prefilled syringe Inject 441 mg into the muscle every 28 (twenty-eight) days.   aspirin  EC 81 MG tablet Take 1 tablet (81 mg total) by mouth daily. Swallow whole.   [DISCONTINUED] atorvastatin  (LIPITOR) 10 MG tablet Take 1 tablet (10 mg total) by mouth daily.   atorvastatin  (LIPITOR) 10 MG tablet Take 1 tablet (10 mg total) by mouth daily.   Facility-Administered Encounter Medications as of 12/25/2024  Medication   ARIPiprazole  Lauroxil ER (ARISTADA ) 441 MG/1.6ML prefilled syringe 441 mg    Review of Systems  Constitutional:  Negative for appetite change, chills, fatigue, fever and unexpected weight change.  Respiratory:  Negative for cough, chest tightness, shortness of breath and wheezing.   Cardiovascular:  Negative for chest pain, palpitations and leg swelling.  Gastrointestinal:  Negative for abdominal distention, abdominal pain, blood in stool, constipation, diarrhea, nausea and vomiting.  Musculoskeletal:  Positive for arthralgias. Negative for back pain, joint swelling, myalgias, neck pain and neck stiffness.       Left knee pain  Skin:  Negative for color change, pallor, rash and wound.  Neurological:  Negative for dizziness, weakness, light-headedness, numbness and headaches.  Hematological:  Does not bruise/bleed easily.    Immunization History  Administered Date(s) Administered   Influenza Split 11/14/2011, 08/27/2012   Influenza,inj,quad, With Preservative 02/18/2019   PFIZER(Purple Top)SARS-COV-2 Vaccination 03/15/2020, 04/05/2020, 01/02/2021   PNEUMOCOCCAL CONJUGATE-20 02/06/2024   Pertinent  Health Maintenance Due  Topic Date Due   Mammogram  03/27/2025   Colonoscopy  04/09/2029   Influenza Vaccine  Discontinued      03/04/2024    1:23 PM 04/29/2024     3:35 PM 04/30/2024    3:34 PM 06/17/2024   11:12 AM 12/25/2024    1:37 PM  Fall Risk  Falls in the past year? 1 1 1  0 1  Was there an injury with Fall? 0  0  0  0  0  Fall Risk Category Calculator 1 2 2  0 1  Patient at Risk for Falls Due to No Fall Risks  No Fall Risks No Fall Risks History of fall(s)  Fall risk Follow up Falls evaluation completed  Falls evaluation completed Falls evaluation completed Falls evaluation completed     Data saved with a previous flowsheet row definition   Functional Status Survey:    Vitals:   12/25/24 1340  BP: (!) 142/88  Pulse: 87  Temp: 97.7 F (36.5 C)  SpO2: 98%  Weight: 265 lb (120.2 kg)  Height: 5' 5 (1.651 m)   Body mass index is 44.1 kg/m. Physical Exam  VITALS: T- 97.7, P- 87, BP- 142/88, SaO2- 98% GENERAL: Alert, cooperative, well developed, no acute distress HEENT: Normocephalic,  normal oropharynx, moist mucous membranes CHEST: Clear to auscultation bilaterally, no wheezes, rhonchi, or crackles CARDIOVASCULAR: Normal heart rate and rhythm, S1 and S2 normal without murmurs ABDOMEN: Soft, non-tender, non-distended, without organomegaly, normal bowel sounds EXTREMITIES: No cyanosis or edema, no tenderness on palpation of the knee, mild tenderness in the popliteal fossa, no redness or warmth on the knee, swelling in the popliteal region NEUROLOGICAL: Cranial nerves grossly intact, moves all extremities without gross motor or sensory deficit except left leg limited due to knee pain when standing    Labs reviewed: Recent Labs    02/27/24 1022  NA 136  K 4.0  CL 101  CO2 29  GLUCOSE 98  BUN 12  CREATININE 0.83  CALCIUM  8.5*   Recent Labs    02/27/24 1022  AST 23  ALT 38  ALKPHOS 76  BILITOT 0.6  PROT 6.3*  ALBUMIN 3.1*   Recent Labs    02/27/24 1022  WBC 5.2  NEUTROABS 2.5  HGB 12.5  HCT 39.8  MCV 88.4  PLT 209   Lab Results  Component Value Date   TSH 1.21 06/12/2023   No results found for: HGBA1C Lab  Results  Component Value Date   CHOL 282 (H) 06/12/2023   HDL 55 06/12/2023   LDLCALC 202 (H) 06/12/2023   TRIG 112 06/12/2023   CHOLHDL 5.1 (H) 06/12/2023    Significant Diagnostic Results in last 30 days:  No results found.  Assessment/Plan Assessment and Plan    Left knee pain and swelling Acute left knee pain and swelling, exacerbated by walking and prolonged sitting. Pain radiates from the knee to the thigh. No redness or warmth. Differential includes arthritis or Baker's cyst. Pain management with oxycodone  was effective but not preferred for long-term use. Blood pressure elevated due to pain. - Referred to [facility] for urgent evaluation and management. - Advised use of ice packs to reduce swelling. - Discussed potential for cortisone injection if inflammation is confirmed. - Provided address and contact information for [facility].  Hyperlipidemia Chronic condition managed with atorvastatin . - [facility].   Family/ staff Communication: Reviewed plan of care with patient verbalized understanding.Declined ED for evaluation of left knee pain but agreed to go to Orthopedic Urgent care at [facility].Address and telephone number provided.   Labs/tests ordered: Advised to go urgent Care at [facility]   Next Appointment: Return if symptoms worsen or fail to improve.  Total time: 20 minutes. Greater than 50% of total time spent doing patient education regarding left knee pain,health maintenance including symptom/medication management.   Roxan BROCKS Feliz Herard, NP    [1]  Allergies Allergen Reactions   Gabapentin Swelling   Peanut-Containing Drug Products Anaphylaxis   Shellfish Allergy Hives and Other (See Comments)    Some shellfish- is able to eat shrimp but not mussels, clams, oysters- hives and lip swelling   Codeine Other (See Comments)    REACTION: lethargic   Lamotrigine Other (See  Comments)    REACTION: lips swell   Topiramate Other (See Comments)    REACTION: hallucinations   "

## 2024-12-30 ENCOUNTER — Ambulatory Visit: Admitting: Pulmonary Disease

## 2024-12-30 ENCOUNTER — Other Ambulatory Visit (HOSPITAL_COMMUNITY): Payer: Self-pay | Admitting: Student

## 2025-01-07 ENCOUNTER — Ambulatory Visit (INDEPENDENT_AMBULATORY_CARE_PROVIDER_SITE_OTHER)

## 2025-01-07 ENCOUNTER — Ambulatory Visit (HOSPITAL_COMMUNITY)

## 2025-01-07 VITALS — Ht 65.0 in | Wt 265.0 lb

## 2025-01-07 VITALS — BP 178/94 | HR 87 | Temp 97.7°F | Ht 65.0 in | Wt <= 1120 oz

## 2025-01-07 DIAGNOSIS — F319 Bipolar disorder, unspecified: Secondary | ICD-10-CM

## 2025-01-07 NOTE — Progress Notes (Cosign Needed)
 Patient was sent over from [place] office as her injection was not delivered  from Pharmacy. Sample provided.  Patient's blood pressure was checked manually and automatically prior to injection. Patient's blood pressure was elevated each time.   Patient received injection in left deltoid. Patient tolerated injection well. Patient denies Si/Hi/AVH. Patient reported recent stressful events. Writer encouraged stress reliving activities and hobbies.   Patient was monitored for 5 minutes after injection. Patient left clinic alert and ambulatory.   Patient will follow up with original clinic in 28 days.

## 2025-01-08 NOTE — Progress Notes (Unsigned)
 Patient came in for her injection but it has been held up in the mail. I called [facility] and they had a sample they could give the patient. Patient agreed to go to the other office this one time for her injection.

## 2025-01-14 ENCOUNTER — Ambulatory Visit (INDEPENDENT_AMBULATORY_CARE_PROVIDER_SITE_OTHER): Admitting: Nurse Practitioner

## 2025-01-14 ENCOUNTER — Encounter (INDEPENDENT_AMBULATORY_CARE_PROVIDER_SITE_OTHER): Payer: Self-pay | Admitting: Nurse Practitioner

## 2025-01-14 ENCOUNTER — Telehealth (INDEPENDENT_AMBULATORY_CARE_PROVIDER_SITE_OTHER): Payer: Self-pay | Admitting: Nurse Practitioner

## 2025-01-14 VITALS — BP 154/90 | HR 88 | Temp 98.2°F | Ht 64.5 in | Wt 265.0 lb

## 2025-01-14 DIAGNOSIS — E782 Mixed hyperlipidemia: Secondary | ICD-10-CM

## 2025-01-14 DIAGNOSIS — I10 Essential (primary) hypertension: Secondary | ICD-10-CM

## 2025-01-14 DIAGNOSIS — F319 Bipolar disorder, unspecified: Secondary | ICD-10-CM

## 2025-01-14 DIAGNOSIS — E66813 Obesity, class 3: Secondary | ICD-10-CM | POA: Diagnosis not present

## 2025-01-14 DIAGNOSIS — G4733 Obstructive sleep apnea (adult) (pediatric): Secondary | ICD-10-CM

## 2025-01-14 DIAGNOSIS — Z6841 Body Mass Index (BMI) 40.0 and over, adult: Secondary | ICD-10-CM

## 2025-01-14 DIAGNOSIS — Z0289 Encounter for other administrative examinations: Secondary | ICD-10-CM

## 2025-01-14 NOTE — Progress Notes (Signed)
 " [address] [place], [place], [place] [address] Office: [phone]  /  Fax: [phone]   Initial Consultation    [place] Fresh[facility] was seen in clinic today to evaluate for obesity. She is interested in losing weight to improve overall health and reduce the risk of weight related complications. She presents today to review program treatment options, initial physical assessment, and evaluation.    Evonna does have hypertension and her BP's are currently well controlled on Amlodipine  2.5 mg QD every day. Denies headaches, chest pain shortness of breath at rest and dizziness . Her BP has been running 170's/90's. She has increased stress recently.  BP Readings from Last 3 Encounters:  01/14/25 (!) 154/90  12/25/24 (!) 142/88  10/20/24 131/87   She does have hyperlipidemia and is currently on Atorvastatin  10 mg every day and denies side effects.  Lab Results  Component Value Date   CHOL 282 (H) 06/12/2023   HDL 55 06/12/2023   LDLCALC 202 (H) 06/12/2023   TRIG 112 06/12/2023   CHOLHDL 5.1 (H) 06/12/2023    She does have moderate sleep apnea on sleep study and has tried CPAP but has been unable to use it due to inability to sleep, she follows with pulmonology- recommends use of Mounjaro - no family history of MEN2, MTC and no personal history of pancreatitis. Current BMI is 44.8  She is on Aristada  for bipolar and has noticed weight gain. Previously used Abilify  and had weight gain.   She had right breast Ca and had lumpectomy and lymph node dissection followed by chemo and radiation. She also had TAH-BSO at that time- negative for BRCA1 Did Arimidex for 5 years.  Anthropometrics and Bioimpedance Analysis   Body mass index is 44.78 kg/m. Body Fat Mass : 50.3 % Visceral Fat Mass Rating : 18   Obesity Related Diseases and Complications  Obesity Quality of Life and Psychosocial Complications: Depression and / or anxiety, Body image dissatisfaction, Reduced health-related quality  of life, and Decrease physical activity and social participation  Cardiometabolic: Dyslipidemia or hypercholesterolemia, Hypertension, DOE, and Fatigue  Biomechanical: Osteoarthritis of the knee or hip, Low back pain, Obstructive sleep apnea, and Asthma   Weight Related History  She was referred by: Specialist  When asked what they would like to accomplish? She states: Adopt a healthier eating pattern and lifestyle, Improve energy levels and physical activity, Improve existing medical conditions, Improve quality of life, Improve appearance, Improve self-confidence, and Lose 50 lbs  Weight history: After her 3 pregnancies she started to gain weight age 27- and then noticed a gradual increase.   Highest weight: 265  Contributing factors: family history of obesity, disruption of circadian rhythm / sleep disordered breathing, consumption of processed foods, use of obesogenic medications: Psychotropic medications, moderate to high levels of stress, reduced physical activity, chronic skipping of meals, mental health problems, menopause, need for convenience due to lack of time, enticing relationships and enviroment, multiple weight loss attempts in the past, sedentary job, hectic pace of life, and need for convenient foods  Prior weight loss attempts: None  Current or previous pharmacotherapy: GLP-1 only had 1 month and lost 8 pounds  Response to medication: Was cost prohibitive or lost coverage for AOM  Current nutrition plan: None  Greatest challenge with dieting: convenience.  Current level of physical activity: None  Barriers to Exercise: time, energy, and does not enjoy  Readiness and Motivation  On a scale from 0 to 10 How ready are you to make changes to  your eating and physical activity to lose weight? 10 How important is it for you to lose weight right now ? 10 How confident are you that you can lose weight if you try? 10  Past Medical History   Past Medical History:   Diagnosis Date   Anginal pain    11/2011   Asthma    Bipolar 1 disorder (HCC)    Bipolar affective disorder, depressed, mild (HCC)    Remote history of suicidal attempts   Breast cancer (HCC)    In 2005 treated with surgery and Arimidex for 5 years   Depression    Headache(784.0)    h/o migraines    History of pulmonary embolism    Bilateral in 2001   HNP (herniated nucleus pulposus)    2000- L4-5, used pain mgt. prog. in [place]    Hypertension    Personal history of chemotherapy    Personal history of radiation therapy    Shortness of breath    in the past   Sleep apnea      Objective    BP (!) 154/90   Pulse 88   Temp 98.2 F (36.8 C)   Ht 5' 4.5 (1.638 m)   Wt 265 lb (120.2 kg)   SpO2 99%   BMI 44.78 kg/m  She was weighed on the bioimpedance scale: Body mass index is 44.78 kg/m.    General:  Alert, oriented and cooperative. Patient is in no acute distress.  Respiratory: Normal respiratory effort, no problems with respiration noted   Gait: able to ambulate independently  Mental Status: Normal mood and affect. Normal behavior. Normal judgment and thought content.   Diagnostic Data Reviewed  BMET    Component Value Date/Time   NA 136 02/27/2024 1022   K 4.0 02/27/2024 1022   CL 101 02/27/2024 1022   CO2 29 02/27/2024 1022   GLUCOSE 98 02/27/2024 1022   BUN 12 02/27/2024 1022   CREATININE 0.83 02/27/2024 1022   CREATININE 0.77 06/12/2023 0905   CALCIUM  8.5 (L) 02/27/2024 1022   GFRNONAA >60 02/27/2024 1022   GFRAA >90 06/26/2012 1645   No results found for: HGBA1C No results found for: INSULIN CBC    Component Value Date/Time   WBC 5.2 02/27/2024 1022   RBC 4.50 02/27/2024 1022   HGB 12.5 02/27/2024 1022   HGB 13.0 11/07/2011 1318   HCT 39.8 02/27/2024 1022   HCT 39.1 11/07/2011 1318   PLT 209 02/27/2024 1022   PLT 203 11/07/2011 1318   MCV 88.4 02/27/2024 1022   MCV 84.3 11/07/2011 1318   MCH 27.8 02/27/2024 1022   MCHC 31.4 02/27/2024  1022   RDW 14.0 02/27/2024 1022   RDW 13.9 11/07/2011 1318   Iron/TIBC/Ferritin/ %Sat No results found for: IRON, TIBC, FERRITIN, IRONPCTSAT Lipid Panel     Component Value Date/Time   CHOL 282 (H) 06/12/2023 0905   TRIG 112 06/12/2023 0905   HDL 55 06/12/2023 0905   CHOLHDL 5.1 (H) 06/12/2023 0905   VLDL 26 11/14/2011 0520   LDLCALC 202 (H) 06/12/2023 0905   Hepatic Function Panel     Component Value Date/Time   PROT 6.3 (L) 02/27/2024 1022   ALBUMIN 3.1 (L) 02/27/2024 1022   AST 23 02/27/2024 1022   ALT 38 02/27/2024 1022   ALKPHOS 76 02/27/2024 1022   BILITOT 0.6 02/27/2024 1022      Component Value Date/Time   TSH 1.21 06/12/2023 0905    Medications  Outpatient Encounter Medications as  of 01/14/2025  Medication Sig   albuterol  (PROVENTIL  HFA;VENTOLIN  HFA) 108 (90 BASE) MCG/ACT inhaler Inhale 2 puffs into the lungs every 6 (six) hours as needed. sob    amLODipine  (NORVASC ) 2.5 MG tablet Take 1 tablet by mouth once daily   ARIPiprazole  Lauroxil ER (ARISTADA ) 441 MG/1.6ML prefilled syringe Inject 441 mg into the muscle every 28 (twenty-eight) days.   aspirin  EC 81 MG tablet Take 1 tablet (81 mg total) by mouth daily. Swallow whole.   atorvastatin  (LIPITOR) 10 MG tablet Take 1 tablet (10 mg total) by mouth daily.   Facility-Administered Encounter Medications as of 01/14/2025  Medication   ARIPiprazole  Lauroxil ER (ARISTADA ) 441 MG/1.6ML prefilled syringe 441 mg     Assessment and Plan   Mixed hyperlipidemia Limit saturated fats Continue Atorvastatin  10 mg every day Continue to follow regularly with PCP Loss of 10-15% body weight can improve lipid levels  Essential hypertension Continue Amlodipine  2.5 mg every day- reach out to PCP to advise BP's are running too high, even at home in 170's/90's Continue DASH diet Monitor BP and if consistently >140/90 notify PCP If develops headaches, chest pain, shortness of breath or dizziness go to ER Loss of 10-15%  body weight can help improve blood pressures   OSA on CPAP       Was unable to tolerate CPAP- could not sleep, continue to follow with pulmonology       Will discuss use of Zepbound  for weight loss with moderate sleep apnea and BMI >40  Bipolar 1 disorder (HCC)       Continue Aristada  once a month and monitor symptoms       Follow with behavioral health  Class 3 severe obesity with serious comorbidity and body mass index (BMI) of 40.0 to 44.9 in adult, unspecified obesity type (HCC) Obesity Treatment and Action Plan:  Patient will work on garnering support from family and friends to begin weight loss journey. Will work on eliminating or reducing the presence of highly palatable, calorie dense foods in the home. Will complete provided nutritional and psychosocial assessment questionnaire before the next appointment. Will be scheduled for indirect calorimetry to determine resting energy expenditure in a fasting state.  This will allow us  to create a reduced calorie, high-protein meal plan to promote loss of fat mass while preserving muscle mass. Counseled on the health benefits of losing 5%-15% of total body weight. Was counseled on nutritional approaches to weight loss and benefits of reducing processed foods and consuming plant-based foods and high quality protein as part of nutritional weight management. Was counseled on pharmacotherapy and role as an adjunct in weight management.   Education and Additional resources  She was weighed on the bioimpedance scale and results were discussed and documented in the synopsis.  We discussed obesity as a progressive, chronic disease and the importance of a more detailed evaluation of all the factors contributing to the disease.  We reviewed the basic principles in obesity management.   We discussed the importance of long term lifestyle changes which include nutrition, exercise and behavioral modification as well as the importance of customizing  this to her specific health and social needs.  We reviewed the role of medical interventions including pharmacotherapy and surgical interventions.   We discussed the benefits of reaching a healthier weight to alleviate the symptoms of existing conditions and reduce the risks of the biomechanical, cardiometabolic and psychological effects of obesity.  We reviewed our program approach and philosophy, which are guided by the  four pillars of obesity medicine.  We discussed how to prepare for intake appointment and the importance of fasting and avoidance of stimulants for at least 8 hours prior to indirect calorimetry.  Solei M Chapman-McDavid appears to be in the action stage of change and reports being ready to initiate intensive lifestyle and behavioral modifications as part of their weight loss journey.  Attestation  Reviewed by clinician on day of visit: allergies, medications, problem list, medical history, surgical history, family history, social history, and previous encounter notes pertinent to obesity diagnosis.  I personally spent a total of 34 minutes in the care of the patient today including preparing to see the patient, getting/reviewing separately obtained history, performing a medically appropriate exam/evaluation, counseling and educating, and documenting clinical information in the EHR.   Lonell Liverpool ANP-C "

## 2025-01-14 NOTE — Telephone Encounter (Signed)
 New Patient Script Check List Patient Acknowledges Each Item Below:  [x]   There is a one-time $99.00 Program Fee.  [x]   You must have a Body Mass Index (BMI) of 30 or higher to qualify for our program.  [x]   Once you become a New Patient and schedule your first appointment, you are responsible for completing our New Patient Packet.  [x]   Prior to the first appointment, you will be required to fast for eight hours  [x]   You must also drink plenty of water that day before and morning of your new patient appointment.  [x]   You will also have an electrocardiogram (EKG) during your New Patient visit  do not apply any lotions or creams prior to the appointment.   [x]   You will also have a metabolic breathing test known as the Indirect Calorimetry (IC) Test  [x]   We are a Specialty office, but we bill as a Primary Care. You will have the same co-payments and co-insurances as you would at your primary care provider's office.  [x]   We have a 5-minute grace period. If you arrive later than this time, we will need to reschedule your appointment to a later time or date.  [x]   There is a $50 fee if the initial consultation visit, New Patient Appointment, or First Follow-up Appointment is a no-show, so please make sure to pick a time that works with your schedule.   [x]   We recommend that all our patients sign up for and utilize [employer].

## 2025-02-01 ENCOUNTER — Ambulatory Visit (INDEPENDENT_AMBULATORY_CARE_PROVIDER_SITE_OTHER): Admitting: Nurse Practitioner

## 2025-02-05 ENCOUNTER — Ambulatory Visit (HOSPITAL_COMMUNITY)

## 2025-02-15 ENCOUNTER — Ambulatory Visit (INDEPENDENT_AMBULATORY_CARE_PROVIDER_SITE_OTHER): Admitting: Nurse Practitioner

## 2025-03-08 ENCOUNTER — Ambulatory Visit (HOSPITAL_COMMUNITY)
